# Patient Record
Sex: Female | Born: 1955 | Race: White | Hispanic: No | Smoking: Current every day smoker
Health system: Southern US, Community
[De-identification: ages and names within clinical notes are randomized; demographics above are authoritative.]

## PROBLEM LIST (undated history)

## (undated) ENCOUNTER — Emergency Department (HOSPITAL_BASED_OUTPATIENT_CLINIC_OR_DEPARTMENT_OTHER): Admission: EM | Payer: BLUE CROSS/BLUE SHIELD | Source: Home / Self Care

## (undated) DIAGNOSIS — F329 Major depressive disorder, single episode, unspecified: Secondary | ICD-10-CM

## (undated) DIAGNOSIS — F419 Anxiety disorder, unspecified: Secondary | ICD-10-CM

## (undated) DIAGNOSIS — K219 Gastro-esophageal reflux disease without esophagitis: Secondary | ICD-10-CM

## (undated) DIAGNOSIS — F32A Depression, unspecified: Secondary | ICD-10-CM

## (undated) DIAGNOSIS — K449 Diaphragmatic hernia without obstruction or gangrene: Secondary | ICD-10-CM

## (undated) DIAGNOSIS — T7840XA Allergy, unspecified, initial encounter: Secondary | ICD-10-CM

## (undated) DIAGNOSIS — I10 Essential (primary) hypertension: Secondary | ICD-10-CM

## (undated) HISTORY — PX: WISDOM TOOTH EXTRACTION: SHX21

## (undated) HISTORY — DX: Allergy, unspecified, initial encounter: T78.40XA

## (undated) HISTORY — PX: APPENDECTOMY: SHX54

## (undated) HISTORY — PX: TUBAL LIGATION: SHX77

---

## 1999-04-19 ENCOUNTER — Other Ambulatory Visit: Admission: RE | Admit: 1999-04-19 | Discharge: 1999-04-19 | Payer: Self-pay | Admitting: *Deleted

## 1999-04-27 ENCOUNTER — Encounter (INDEPENDENT_AMBULATORY_CARE_PROVIDER_SITE_OTHER): Payer: Self-pay | Admitting: Specialist

## 1999-04-27 ENCOUNTER — Other Ambulatory Visit: Admission: RE | Admit: 1999-04-27 | Discharge: 1999-04-27 | Payer: Self-pay | Admitting: *Deleted

## 2000-04-24 ENCOUNTER — Encounter: Payer: Self-pay | Admitting: Internal Medicine

## 2000-04-24 ENCOUNTER — Emergency Department (HOSPITAL_COMMUNITY): Admission: EM | Admit: 2000-04-24 | Discharge: 2000-04-24 | Payer: Self-pay | Admitting: Internal Medicine

## 2000-09-06 ENCOUNTER — Other Ambulatory Visit: Admission: RE | Admit: 2000-09-06 | Discharge: 2000-09-06 | Payer: Self-pay | Admitting: *Deleted

## 2001-11-29 ENCOUNTER — Emergency Department (HOSPITAL_COMMUNITY): Admission: EM | Admit: 2001-11-29 | Discharge: 2001-11-29 | Payer: Self-pay | Admitting: Emergency Medicine

## 2001-12-02 ENCOUNTER — Other Ambulatory Visit (HOSPITAL_COMMUNITY): Admission: RE | Admit: 2001-12-02 | Discharge: 2001-12-11 | Payer: Self-pay | Admitting: Psychiatry

## 2001-12-12 ENCOUNTER — Encounter: Admission: RE | Admit: 2001-12-12 | Discharge: 2001-12-12 | Payer: Self-pay | Admitting: *Deleted

## 2003-07-06 ENCOUNTER — Emergency Department (HOSPITAL_COMMUNITY): Admission: EM | Admit: 2003-07-06 | Discharge: 2003-07-06 | Payer: Self-pay | Admitting: Emergency Medicine

## 2003-07-07 ENCOUNTER — Other Ambulatory Visit (HOSPITAL_COMMUNITY): Admission: RE | Admit: 2003-07-07 | Discharge: 2003-07-22 | Payer: Self-pay | Admitting: *Deleted

## 2003-09-01 ENCOUNTER — Emergency Department (HOSPITAL_COMMUNITY): Admission: EM | Admit: 2003-09-01 | Discharge: 2003-09-01 | Payer: Self-pay | Admitting: *Deleted

## 2005-04-30 ENCOUNTER — Ambulatory Visit: Payer: Self-pay | Admitting: Family Medicine

## 2005-04-30 ENCOUNTER — Ambulatory Visit: Payer: Self-pay | Admitting: *Deleted

## 2005-09-14 ENCOUNTER — Ambulatory Visit: Payer: Self-pay | Admitting: Family Medicine

## 2006-03-19 ENCOUNTER — Ambulatory Visit: Payer: Self-pay | Admitting: Family Medicine

## 2006-05-22 ENCOUNTER — Ambulatory Visit: Payer: Self-pay | Admitting: Family Medicine

## 2006-05-24 ENCOUNTER — Ambulatory Visit: Payer: Self-pay | Admitting: Family Medicine

## 2007-02-25 ENCOUNTER — Ambulatory Visit: Payer: Self-pay | Admitting: Family Medicine

## 2007-04-29 ENCOUNTER — Encounter (INDEPENDENT_AMBULATORY_CARE_PROVIDER_SITE_OTHER): Payer: Self-pay | Admitting: Family Medicine

## 2007-04-29 ENCOUNTER — Ambulatory Visit: Payer: Self-pay | Admitting: Family Medicine

## 2007-07-16 ENCOUNTER — Encounter (INDEPENDENT_AMBULATORY_CARE_PROVIDER_SITE_OTHER): Payer: Self-pay | Admitting: *Deleted

## 2007-08-01 ENCOUNTER — Telehealth (INDEPENDENT_AMBULATORY_CARE_PROVIDER_SITE_OTHER): Payer: Self-pay | Admitting: *Deleted

## 2007-11-05 ENCOUNTER — Emergency Department (HOSPITAL_COMMUNITY): Admission: EM | Admit: 2007-11-05 | Discharge: 2007-11-05 | Payer: Self-pay | Admitting: Emergency Medicine

## 2008-02-11 ENCOUNTER — Emergency Department (HOSPITAL_COMMUNITY): Admission: EM | Admit: 2008-02-11 | Discharge: 2008-02-11 | Payer: Self-pay | Admitting: Family Medicine

## 2008-04-23 ENCOUNTER — Emergency Department (HOSPITAL_COMMUNITY): Admission: EM | Admit: 2008-04-23 | Discharge: 2008-04-23 | Payer: Self-pay | Admitting: Emergency Medicine

## 2008-07-17 ENCOUNTER — Emergency Department (HOSPITAL_COMMUNITY): Admission: EM | Admit: 2008-07-17 | Discharge: 2008-07-17 | Payer: Self-pay | Admitting: Family Medicine

## 2009-03-04 ENCOUNTER — Emergency Department (HOSPITAL_COMMUNITY): Admission: EM | Admit: 2009-03-04 | Discharge: 2009-03-04 | Payer: Self-pay | Admitting: Family Medicine

## 2009-07-20 ENCOUNTER — Emergency Department (HOSPITAL_COMMUNITY): Admission: EM | Admit: 2009-07-20 | Discharge: 2009-07-20 | Payer: Self-pay | Admitting: Family Medicine

## 2009-09-18 ENCOUNTER — Emergency Department (HOSPITAL_COMMUNITY): Admission: EM | Admit: 2009-09-18 | Discharge: 2009-09-18 | Payer: Self-pay | Admitting: Family Medicine

## 2009-10-13 ENCOUNTER — Emergency Department (HOSPITAL_COMMUNITY): Admission: EM | Admit: 2009-10-13 | Discharge: 2009-10-13 | Payer: Self-pay | Admitting: Emergency Medicine

## 2009-12-17 ENCOUNTER — Emergency Department (HOSPITAL_BASED_OUTPATIENT_CLINIC_OR_DEPARTMENT_OTHER): Admission: EM | Admit: 2009-12-17 | Discharge: 2009-12-17 | Payer: Self-pay | Admitting: Emergency Medicine

## 2010-11-19 ENCOUNTER — Encounter: Payer: Self-pay | Admitting: Occupational Therapy

## 2010-11-29 ENCOUNTER — Encounter: Payer: Self-pay | Admitting: Obstetrics & Gynecology

## 2011-02-02 LAB — COMPREHENSIVE METABOLIC PANEL
Albumin: 4 g/dL (ref 3.5–5.2)
Alkaline Phosphatase: 92 U/L (ref 39–117)
BUN: 8 mg/dL (ref 6–23)
Calcium: 9.8 mg/dL (ref 8.4–10.5)
Glucose, Bld: 103 mg/dL — ABNORMAL HIGH (ref 70–99)
Potassium: 3.7 mEq/L (ref 3.5–5.1)
Total Protein: 7.6 g/dL (ref 6.0–8.3)

## 2011-02-02 LAB — CBC
HCT: 45.7 % (ref 36.0–46.0)
MCHC: 34.7 g/dL (ref 30.0–36.0)
Platelets: 245 10*3/uL (ref 150–400)
RDW: 13.3 % (ref 11.5–15.5)

## 2011-02-02 LAB — DIFFERENTIAL
Basophils Relative: 0 % (ref 0–1)
Lymphocytes Relative: 24 % (ref 12–46)
Lymphs Abs: 1.9 10*3/uL (ref 0.7–4.0)
Monocytes Absolute: 0.6 10*3/uL (ref 0.1–1.0)
Monocytes Relative: 8 % (ref 3–12)
Neutro Abs: 5.3 10*3/uL (ref 1.7–7.7)
Neutrophils Relative %: 68 % (ref 43–77)

## 2011-02-07 ENCOUNTER — Emergency Department (HOSPITAL_COMMUNITY)
Admission: EM | Admit: 2011-02-07 | Discharge: 2011-02-08 | Disposition: A | Discharge status: Home / Self Care | Payer: BC Managed Care – PPO | Attending: Emergency Medicine | Admitting: Emergency Medicine

## 2011-02-07 DIAGNOSIS — M79609 Pain in unspecified limb: Secondary | ICD-10-CM | POA: Insufficient documentation

## 2011-02-07 DIAGNOSIS — M2550 Pain in unspecified joint: Secondary | ICD-10-CM | POA: Insufficient documentation

## 2011-02-08 LAB — POCT I-STAT, CHEM 8
BUN: 4 mg/dL — ABNORMAL LOW (ref 6–23)
Chloride: 101 mEq/L (ref 96–112)
Creatinine, Ser: 0.8 mg/dL (ref 0.4–1.2)
Glucose, Bld: 92 mg/dL (ref 70–99)
Hemoglobin: 14.6 g/dL (ref 12.0–15.0)
Potassium: 3.4 mEq/L — ABNORMAL LOW (ref 3.5–5.1)
Sodium: 140 mEq/L (ref 135–145)

## 2011-07-26 LAB — POCT I-STAT, CHEM 8
Calcium, Ion: 1.17
Chloride: 104
Creatinine, Ser: 0.9
Glucose, Bld: 92
Hemoglobin: 16 — ABNORMAL HIGH
Potassium: 4.1

## 2011-07-26 LAB — DIFFERENTIAL
Basophils Relative: 1
Eosinophils Absolute: 0.1
Lymphs Abs: 2.3
Monocytes Absolute: 0.6
Monocytes Relative: 7
Neutrophils Relative %: 62

## 2011-07-26 LAB — CBC
Hemoglobin: 14.9
MCHC: 35
MCV: 91.6
RBC: 4.65
WBC: 8

## 2011-07-30 LAB — POCT URINALYSIS DIP (DEVICE)
Bilirubin Urine: NEGATIVE
Glucose, UA: 100 — AB
Specific Gravity, Urine: 1.015
pH: 6

## 2012-01-24 ENCOUNTER — Emergency Department (HOSPITAL_COMMUNITY)
Admission: EM | Admit: 2012-01-24 | Discharge: 2012-01-24 | Disposition: A | Discharge status: Home / Self Care | Payer: Self-pay | Attending: Emergency Medicine | Admitting: Emergency Medicine

## 2012-01-24 ENCOUNTER — Encounter (HOSPITAL_COMMUNITY): Payer: Self-pay

## 2012-01-24 DIAGNOSIS — K449 Diaphragmatic hernia without obstruction or gangrene: Secondary | ICD-10-CM | POA: Insufficient documentation

## 2012-01-24 DIAGNOSIS — K219 Gastro-esophageal reflux disease without esophagitis: Secondary | ICD-10-CM | POA: Insufficient documentation

## 2012-01-24 DIAGNOSIS — S161XXA Strain of muscle, fascia and tendon at neck level, initial encounter: Secondary | ICD-10-CM

## 2012-01-24 DIAGNOSIS — M542 Cervicalgia: Secondary | ICD-10-CM | POA: Insufficient documentation

## 2012-01-24 HISTORY — DX: Diaphragmatic hernia without obstruction or gangrene: K44.9

## 2012-01-24 MED ORDER — DIAZEPAM 5 MG PO TABS: 5.0000 mg | ORAL_TABLET | Freq: Two times a day (BID) | ORAL | Status: AC

## 2012-01-24 MED ORDER — DIAZEPAM 5 MG PO TABS
5.0000 mg | ORAL_TABLET | Freq: Once | ORAL | Status: AC
  Administered 2012-01-24: 5 mg via ORAL
  Filled 2012-01-24: qty 1

## 2012-01-24 NOTE — ED Notes (Signed)
Pt c/o pain to L shoulder radiating into L neck and back of head, onset this am, worse with movement. Minimal improvement with heat. Minimal relief with Ibuprofen. Pt states she had this 10ish years ago but can not recall treatment.

## 2012-01-24 NOTE — ED Notes (Signed)
Pt has acid reflux and has to sleep sitting up, she complains of neck pain with spasms

## 2012-01-24 NOTE — ED Provider Notes (Signed)
History     CSN: [id number]  Arrival date & time 01/24/12  2033   First MD Initiated Contact with Patient 01/24/12 2210      Chief Complaint  Patient presents with  . Neck Pain    (Consider location/radiation/quality/duration/timing/severity/associated sxs/prior treatment) HPI Comments: Patient notes she has neck pain that started this morning.  She slept upright due to acid reflux related to her hiatal hernia and noted that her head was either slumped over forward or to the spinal night.  She's noted worse pain on the left side radiating up to the back of her head and down her left shoulder.  She has no weakness or numbness in her arm.  She is able to complete all tasks with her upper extremities.  No fevers or chills.  No prior neck injuries.  No neck surgeries.  Patient believes this is a muscle spasm and has been using warm compresses with mild relief.  She has noted similar episodes in the past although it has been many years.  Patient is a 56 y.o. female presenting with neck pain. The history is provided by the patient. No language interpreter was used.  Neck Pain  This is a new problem. Pertinent negatives include no chest pain and no headaches.    Past Medical History  Diagnosis Date  . Hiatal hernia     History reviewed. No pertinent past surgical history.  History reviewed. No pertinent family history.  History  Substance Use Topics  . Smoking status: Not on file  . Smokeless tobacco: Not on file  . Alcohol Use:     OB History    Grav Para Term Preterm Abortions TAB SAB Ect Mult Living                  Review of Systems  Constitutional: Negative.  Negative for fever and chills.  HENT: Positive for neck pain.   Eyes: Negative.  Negative for discharge and redness.  Respiratory: Negative.  Negative for cough and shortness of breath.   Cardiovascular: Negative.  Negative for chest pain.  Gastrointestinal: Negative.  Negative for nausea, vomiting, abdominal pain  and diarrhea.  Genitourinary: Negative.  Negative for dysuria and vaginal discharge.  Musculoskeletal: Negative for back pain.  Skin: Negative.  Negative for color change and rash.  Neurological: Negative.  Negative for syncope and headaches.  Hematological: Negative.  Negative for adenopathy.  Psychiatric/Behavioral: Negative.  Negative for confusion.  All other systems reviewed and are negative.    Allergies  Review of patient's allergies indicates no known allergies.  Home Medications   Current Outpatient Rx  Name Route Sig Dispense Refill  . ACETAMINOPHEN 500 MG PO TABS Oral Take 1,500 mg by mouth 3 (three) times daily as needed. For pain.    . IBUPROFEN 200 MG PO TABS Oral Take 800 mg by mouth every 6 (six) hours as needed. For pain.    Marland Kitchen OMEPRAZOLE 20 MG PO CPDR Oral Take 20 mg by mouth daily.      BP 191/85  Pulse 83  Temp(Src) 97.9 F (36.6 C) (Oral)  Resp 18  SpO2 99%  Physical Exam  Nursing note and vitals reviewed. Constitutional: She is oriented to person, place, and time. She appears well-developed and well-nourished.  Non-toxic appearance. She does not have a sickly appearance.  HENT:  Head: Normocephalic and atraumatic.  Eyes: Conjunctivae, EOM and lids are normal. Pupils are equal, round, and reactive to light. No scleral icterus.  Neck: Trachea normal  and normal range of motion. Neck supple.  Cardiovascular: Normal rate, regular rhythm and normal heart sounds.   Pulmonary/Chest: Effort normal and breath sounds normal. No respiratory distress. She has no wheezes. She has no rales.  Abdominal: Soft. Normal appearance. There is no tenderness. There is no rebound, no guarding and no CVA tenderness.  Musculoskeletal: Normal range of motion.       No focal C-spine tenderness or step-offs on examination.  Patient does have tenderness to palpation over her left paraspinal muscles and left upper trapezius muscles  Lymphadenopathy:    She has no cervical adenopathy.    Neurological: She is alert and oriented to person, place, and time. She has normal strength.  Skin: Skin is warm, dry and intact. No rash noted.  Psychiatric: She has a normal mood and affect. Her behavior is normal. Judgment and thought content normal.    ED Course  Procedures (including critical care time)  Labs Reviewed - No data to display No results found.   No diagnosis found.    MDM  Patient with likely cervical muscle spasm exacerbated by sleeping upright with her head that awkward angles last night.  Patient has no neurovascular deficits.  She has no other trauma or injuries or past neck surgeries.  Patient will be placed on muscle relaxants and has been advised that if it does not improve she should seek reevaluation.        Nat Christen, MD 01/24/12 2234

## 2012-01-24 NOTE — Discharge Instructions (Signed)
Cervical Strain Care After A cervical strain is when the muscles and ligaments in your neck have been stretched. The bones are not broken. If you had any problems moving your arms or legs immediately after the injury, even if the problem has gone away, make sure to tell this to your caregiver.  HOME CARE INSTRUCTIONS   While awake, apply ice packs to the neck or areas of pain about every 1 to 2 hours, for 15 to 20 minutes at a time. Do this for 2 days. If you were given a cervical collar for support, ask your caregiver if you may remove it for bathing or applying ice.   If given a cervical collar, wear as instructed. Do not remove any collar unless instructed by a caregiver.   Only take over-the-counter or prescription medicines for pain, discomfort, or fever as directed by your caregiver.  Recheck with the hospital or clinic after a radiologist has read your X-rays. Recheck with the hospital or clinic to make sure the initial readings are correct. Do this also to determine if you need further studies. It is your responsibility to find out your X-ray results. X-rays are sometimes repeated in one week to ten days. These are often repeated to make sure that a hairline fracture was not overlooked. Ask your caregiver how you are to find out about your radiology (X-ray) results. SEEK IMMEDIATE MEDICAL CARE IF:   You have increasing pain in your neck.   You develop difficulties swallowing or breathing.   You have numbness, weakness, or movement problems in the arms or legs.   You have difficulty walking.   You develop bowel or bladder retention or incontinence.   You have problems with walking.  MAKE SURE YOU:   Understand these instructions.   Will watch your condition.   Will get help right away if you are not doing well or get worse.  Document Released: 10/15/2005 Document Revised: 06/27/2011 Document Reviewed: 05/28/2008 ExitCare Patient Information 2012 [employer]. 

## 2012-04-10 ENCOUNTER — Encounter (HOSPITAL_COMMUNITY): Payer: Self-pay | Admitting: Emergency Medicine

## 2012-04-10 ENCOUNTER — Emergency Department (HOSPITAL_COMMUNITY)
Admission: EM | Admit: 2012-04-10 | Discharge: 2012-04-10 | Disposition: A | Discharge status: Home / Self Care | Payer: Self-pay | Attending: Emergency Medicine | Admitting: Emergency Medicine

## 2012-04-10 DIAGNOSIS — J019 Acute sinusitis, unspecified: Secondary | ICD-10-CM

## 2012-04-10 DIAGNOSIS — R51 Headache: Secondary | ICD-10-CM | POA: Insufficient documentation

## 2012-04-10 DIAGNOSIS — R197 Diarrhea, unspecified: Secondary | ICD-10-CM

## 2012-04-10 DIAGNOSIS — F17200 Nicotine dependence, unspecified, uncomplicated: Secondary | ICD-10-CM | POA: Insufficient documentation

## 2012-04-10 MED ORDER — AZITHROMYCIN 250 MG PO TABS: 250.0000 mg | ORAL_TABLET | Freq: Every day | ORAL | Status: AC

## 2012-04-10 NOTE — Discharge Instructions (Signed)
RESOURCE GUIDE  Chronic Pain Problems: Contact [facility]  [phone] Patients need to be referred by their primary care doctor.  Insufficient Money for Medicine: Contact United Way:  call "211" or [facility] [phone].  No Primary Care Doctor: - Call [facility]  [phone] - can help you locate a primary care doctor that  accepts your insurance, provides certain services, etc. - Physician Referral Service- 1-800-533-3463  Agencies that provide inexpensive medical care: - [facility]  [phone] - [facility]  [phone] - [facility]  [phone] - [facility]  [phone] - [facility] Parenthood  [phone] - [facility]  [phone]  Medicaid-accepting [place] Providers: - [facility]- [address]  [phone], Mon-Fri 9am-7pm, Sat 9am-1pm - [facility]- [address], [address]  [phone] - [facility]- [address], [address]  [phone] - [facility]- [address] [place] [address]  [phone] - Veita B[facility]- [address], [phone]  Only accepts [place] Access Medicaid patients after they have their name  applied to their card  Self Pay (no insurance) in [place]: - Sickle Cell Patients: Dr Eric Dean, [facility]  [address], [phone] - [facility]- [address]  [phone]       -     [facility]- [address]       -     [facility]- see information above (Speak to Pam H if you do not have insurance)       -  [facility]- [address], [phone]       -  [facility] [place]- [address],  [phone]       -  [facility]- [address] [place] [address], [phone]       -  Dr Osei-Bonsu-  [address], [place], [phone]       -  [facility]- [address], [phone]       -  [facility]- [address] [place] [address], [phone], also [address], [phone]       -    [facility]- [address], [phone], 1st & 3rd Saturday   every month, 10am-1pm  1) Find a Doctor and Pay Out of Pocket Although you won't have to find out who is covered by your insurance plan, it is a good idea to ask around and get recommendations. You will then need to call the office and see if the doctor you have chosen will accept you as a new patient and what types of options they offer for patients who are self-pay. Some doctors offer discounts or will set up payment plans for their patients who do not have insurance, but you will need to ask so you aren't surprised when you get to your appointment.  2) Contact Your Local Health Department Not all health departments have doctors that can see patients for sick visits, but many do, so it is worth a call to see if yours does. If you don't know where your local health department is, you can check in your phone book. The CDC also has a tool to help you locate your state's health department, and many state websites also have   listings of all of their local health departments.  3) Find a Walk-in Clinic If your illness is not likely to be very severe or complicated, you may want to try a walk in clinic. These are popping up all over the country in pharmacies, drugstores, and shopping centers. They're usually staffed by nurse practitioners or physician assistants that have been trained to treat common illnesses and complaints. They're usually fairly quick and inexpensive. However, if you have serious medical issues or chronic medical problems, these are probably not your best option  STD Testing - [place] [facility] [place], [facility] Clinic, [address], [place], phone [phone] or 1-877-539-9860.  Monday - Friday, call for an appointment. - [place]  [facility] [place], [facility] Clinic, [address], [place], phone [phone] or 1-877-539-9860.  Monday - Friday, call for an appointment.  Abuse/Neglect: - [place] Child Abuse Hotline (336) 641-3795 - [place] Child Abuse Hotline 800-378-5315 (After Hours)  Emergency Shelter:  [facility] (336) 271-5985  Maternity Homes: - [facility] (336) 275-9566 - [facility] (704) 372-4663  MRSA Hotline #:   832-7006  [place] Resources  [facility] [place]  [employer] [place] [facility] [address]                 [address]         [address]  [place]                                               [place]                              [place] Phone:  [phone]                                  Phone:  [phone]                   Phone:  [phone]  [place] [facility], [phone] - [place] Services - [facility]- 1-888-581-9988       -     [facility] in [place], [address],                                  336-349-4454, Insurance  [place] Child Abuse Hotline (336) 342-1394 or (336) 342-3537 (After Hours)   Behavioral Health Services  Substance Abuse Resources: - [facility]  336-882-2125 - [facility] 336-784-9470 - [facility] 336-285-9073 - [facility] 336-845-3988 - Residential & Outpatient Substance Abuse Program  800-659-3381  Psychological Services: - [facility]  [phone] - [facility]  [phone] - [place] [facility], [address], [place], ACCESS LINE: 1-800-853-5163 or 336-641-4981, Http://www.[facility].com/services/adult.htm  Dental Assistance  If unable to pay or uninsured, contact:  [facility] or [place] [facility] to become qualified for the adult dental  clinic.  Patients with Medicaid: [facility] [address], [phone], [phone]  If unable   to pay, or uninsured, contact [facility] ([phone]) or [place] [facility] ([phone] in [place], [phone] in [place]) to become qualified for the adult dental clinic  Other Low-Cost Community Dental Services: - [facility]- [address], [place], [place], [address], [phone], [address]. [address], 2nd and 4th Thursday of the month at 6:30am.  10 clients each day by appointment, can sometimes see walk-in patients if someone does not show for an appointment. - [facility]- [address], [place], [place], [address], [phone] - [facility]- [address], [place], [place], [address], [phone] - [place] [facility]- [phone] - [facility]- [phone] -  [facility]- [phone]    

## 2012-04-10 NOTE — ED Provider Notes (Signed)
History     CSN: [id number]  Arrival date & time 04/10/12  4098   First MD Initiated Contact with Patient 04/10/12 2022      Chief Complaint  Patient presents with  . Headache  . Diarrhea    (Consider location/radiation/quality/duration/timing/severity/associated sxs/prior treatment) HPI Comments: Patient reports that she has had sinus pressure for the past 4 days.  Pain located over the frontal and maxillary sinuses.  Sinus pressure associated with nasal congestion, post nasal drip, and rhinorrhea.  She has been taking Advil for the pain, which helps somewhat.  She has also been having diarrhea for the past 4 days.  No vomiting.  No abdominal pain.  No blood in her stool.  She has taken Imodium for the diarrhea, which has helped.  Patient is a 56 y.o. female presenting with headaches and diarrhea. The history is provided by the patient.  Headache  Pertinent negatives include no fever, no shortness of breath, no nausea and no vomiting.  Diarrhea The primary symptoms include diarrhea. Primary symptoms do not include fever, abdominal pain, nausea or vomiting.  The illness does not include chills.    Past Medical History  Diagnosis Date  . Hiatal hernia     History reviewed. No pertinent past surgical history.  History reviewed. No pertinent family history.  History  Substance Use Topics  . Smoking status: Current Everyday Smoker -- 1.0 packs/day  . Smokeless tobacco: Not on file  . Alcohol Use: No    OB History    Grav Para Term Preterm Abortions TAB SAB Ect Mult Living                  Review of Systems  Constitutional: Negative for fever and chills.  HENT: Positive for congestion, rhinorrhea, postnasal drip and sinus pressure. Negative for ear pain, sore throat, trouble swallowing, neck pain and neck stiffness.   Eyes: Negative for visual disturbance.  Respiratory: Negative for cough and shortness of breath.   Gastrointestinal: Positive for diarrhea. Negative for  nausea, vomiting and abdominal pain.  Neurological: Positive for headaches. Negative for dizziness, syncope and light-headedness.    Allergies  Review of patient's allergies indicates no known allergies.  Home Medications   Current Outpatient Rx  Name Route Sig Dispense Refill  . IBUPROFEN 200 MG PO TABS Oral Take 800 mg by mouth every 6 (six) hours as needed. For pain.    Marland Kitchen OMEPRAZOLE 20 MG PO CPDR Oral Take 20 mg by mouth daily.      BP 167/87  Pulse 91  Temp 98.7 F (37.1 C) (Oral)  Resp 18  SpO2 100%  Physical Exam  Nursing note and vitals reviewed. Constitutional: She appears well-developed and well-nourished. No distress.  HENT:  Head: Normocephalic and atraumatic.  Right Ear: Hearing, tympanic membrane, external ear and ear canal normal.  Left Ear: Hearing, tympanic membrane, external ear and ear canal normal.  Nose: Mucosal edema and rhinorrhea present. Right sinus exhibits maxillary sinus tenderness and frontal sinus tenderness. Left sinus exhibits no maxillary sinus tenderness and no frontal sinus tenderness.  Neck: Normal range of motion. Neck supple.  Cardiovascular: Normal rate, regular rhythm and normal heart sounds.   Pulmonary/Chest: Effort normal and breath sounds normal. No respiratory distress. She has no wheezes. She has no rales. She exhibits no tenderness.  Musculoskeletal: Normal range of motion.  Lymphadenopathy:    She has no cervical adenopathy.  Neurological: She is alert.  Skin: Skin is warm and dry. She is not  diaphoretic. No erythema.  Psychiatric: She has a normal mood and affect.    ED Course  Procedures (including critical care time)  Labs Reviewed - No data to display No results found.   No diagnosis found.    MDM  Signs and symptoms consistent with Acute Sinusitis.  Patient given Rx for Azithromycin.         Pascal Lux Grubbs, PA-C 04/11/12 1947

## 2012-04-10 NOTE — ED Notes (Signed)
Patient reports that she has a sinus pressure to her right side of her face. The patient reports sleeping and tired for the last 3 days

## 2012-04-21 NOTE — ED Provider Notes (Signed)
Medical screening examination/treatment/procedure(s) were performed by non-physician practitioner and as supervising physician I was immediately available for consultation/collaboration.  Fredrick Dray, MD 04/21/12 0724 

## 2012-08-19 ENCOUNTER — Emergency Department (HOSPITAL_COMMUNITY)
Admission: EM | Admit: 2012-08-19 | Discharge: 2012-08-19 | Disposition: A | Discharge status: Home / Self Care | Payer: Self-pay | Attending: Emergency Medicine | Admitting: Emergency Medicine

## 2012-08-19 ENCOUNTER — Encounter (HOSPITAL_COMMUNITY): Payer: Self-pay | Admitting: *Deleted

## 2012-08-19 DIAGNOSIS — Z8719 Personal history of other diseases of the digestive system: Secondary | ICD-10-CM | POA: Insufficient documentation

## 2012-08-19 DIAGNOSIS — R1110 Vomiting, unspecified: Secondary | ICD-10-CM | POA: Insufficient documentation

## 2012-08-19 DIAGNOSIS — R52 Pain, unspecified: Secondary | ICD-10-CM | POA: Insufficient documentation

## 2012-08-19 DIAGNOSIS — F17200 Nicotine dependence, unspecified, uncomplicated: Secondary | ICD-10-CM | POA: Insufficient documentation

## 2012-08-19 DIAGNOSIS — Z8659 Personal history of other mental and behavioral disorders: Secondary | ICD-10-CM | POA: Insufficient documentation

## 2012-08-19 DIAGNOSIS — K5289 Other specified noninfective gastroenteritis and colitis: Secondary | ICD-10-CM | POA: Insufficient documentation

## 2012-08-19 DIAGNOSIS — R1084 Generalized abdominal pain: Secondary | ICD-10-CM | POA: Insufficient documentation

## 2012-08-19 DIAGNOSIS — K529 Noninfective gastroenteritis and colitis, unspecified: Secondary | ICD-10-CM

## 2012-08-19 HISTORY — DX: Depression, unspecified: F32.A

## 2012-08-19 HISTORY — DX: Anxiety disorder, unspecified: F41.9

## 2012-08-19 HISTORY — DX: Major depressive disorder, single episode, unspecified: F32.9

## 2012-08-19 LAB — CBC WITH DIFFERENTIAL/PLATELET
Basophils Absolute: 0 10*3/uL (ref 0.0–0.1)
Basophils Relative: 0 % (ref 0–1)
Eosinophils Absolute: 0.1 10*3/uL (ref 0.0–0.7)
Eosinophils Relative: 1 % (ref 0–5)
HCT: 41.9 % (ref 36.0–46.0)
Hemoglobin: 14.6 g/dL (ref 12.0–15.0)
Lymphocytes Relative: 27 % (ref 12–46)
Lymphs Abs: 2.3 10*3/uL (ref 0.7–4.0)
MCH: 32.9 pg (ref 26.0–34.0)
MCHC: 34.8 g/dL (ref 30.0–36.0)
MCV: 94.4 fL (ref 78.0–100.0)
Monocytes Absolute: 0.6 10*3/uL (ref 0.1–1.0)
Monocytes Relative: 7 % (ref 3–12)
Neutro Abs: 5.5 10*3/uL (ref 1.7–7.7)
Neutrophils Relative %: 65 % (ref 43–77)
Platelets: 206 10*3/uL (ref 150–400)
RBC: 4.44 MIL/uL (ref 3.87–5.11)
RDW: 12.6 % (ref 11.5–15.5)
WBC: 8.5 10*3/uL (ref 4.0–10.5)

## 2012-08-19 LAB — URINALYSIS, MICROSCOPIC ONLY
Bilirubin Urine: NEGATIVE
Glucose, UA: NEGATIVE mg/dL
Hgb urine dipstick: NEGATIVE
Ketones, ur: NEGATIVE mg/dL
Leukocytes, UA: NEGATIVE
Nitrite: NEGATIVE
Protein, ur: NEGATIVE mg/dL
Specific Gravity, Urine: 1.007 (ref 1.005–1.030)
Urobilinogen, UA: 0.2 mg/dL (ref 0.0–1.0)
pH: 6 (ref 5.0–8.0)

## 2012-08-19 LAB — COMPREHENSIVE METABOLIC PANEL
Alkaline Phosphatase: 88 U/L (ref 39–117)
BUN: 9 mg/dL (ref 6–23)
Calcium: 9.5 mg/dL (ref 8.4–10.5)
Creatinine, Ser: 0.69 mg/dL (ref 0.50–1.10)
GFR calc Af Amer: >90 mL/min (ref >90)
Glucose, Bld: 98 mg/dL (ref 70–99)
Total Protein: 7 g/dL (ref 6.0–8.3)

## 2012-08-19 MED ORDER — ONDANSETRON HCL 4 MG PO TABS: 4.0000 mg | ORAL_TABLET | Freq: Four times a day (QID) | ORAL | Status: DC

## 2012-08-19 MED ORDER — SODIUM CHLORIDE 0.9 % IV SOLN
1000.0000 mL | Freq: Once | INTRAVENOUS | Status: AC
  Administered 2012-08-19: 1000 mL via INTRAVENOUS

## 2012-08-19 MED ORDER — ONDANSETRON HCL 4 MG/2ML IJ SOLN
4.0000 mg | Freq: Once | INTRAMUSCULAR | Status: AC
  Administered 2012-08-19: 4 mg via INTRAVENOUS
  Filled 2012-08-19: qty 2

## 2012-08-19 MED ORDER — SODIUM CHLORIDE 0.9 % IV SOLN
1000.0000 mL | INTRAVENOUS | Status: DC
  Administered 2012-08-19: 1000 mL via INTRAVENOUS

## 2012-08-19 MED ORDER — POTASSIUM CHLORIDE CRYS ER 20 MEQ PO TBCR
40.0000 meq | EXTENDED_RELEASE_TABLET | Freq: Once | ORAL | Status: AC
  Administered 2012-08-19: 40 meq via ORAL
  Filled 2012-08-19: qty 2

## 2012-08-19 NOTE — ED Provider Notes (Signed)
History     CSN: [id number]  Arrival date & time 08/19/12  4098   First MD Initiated Contact with Patient 08/19/12 1012      Chief Complaint  Patient presents with  . Diarrhea  . Generalized Body Aches  . Emesis    (Consider location/radiation/quality/duration/timing/severity/associated sxs/prior treatment) Patient is a 56 y.o. female presenting with diarrhea and vomiting. The history is provided by the patient. No language interpreter was used.  Diarrhea The primary symptoms include abdominal pain, nausea, vomiting and diarrhea. Primary symptoms do not include fever, dysuria or rash. The illness began 3 to 5 days ago. The onset was gradual. The problem has not changed since onset. The illness is also significant for chills and bloating. The illness does not include constipation, back pain or itching. Significant associated medical issues include GERD. Associated medical issues do not include alcohol abuse, bowel resection, irritable bowel syndrome or diverticulitis.  Emesis  Associated symptoms include abdominal pain, chills and diarrhea. Pertinent negatives include no fever.   56yo female with c/o n/v/d x 4 days.  States that she started vomiting on Saturday and Sunday.  States that she has had diarrhea x 2 days.  > 10 times in the last 24 hours.  She felt general body aches, chills for 6 days prior to that. Also having her regular eipgastric pain with a pmh of gerd.  States that she went to Dr. Quintella Reichert until [facility] closed.  She has taken immodium > 5 times with some relief.  pmh Hiatal hernia, depression, anxiety, smoker.   Generalized abdominal tenderness  No periods x 10 years.  No vaginal discharge.  States 2 weeks ago she had RUQ pain.   Past Medical History  Diagnosis Date  . Hiatal hernia   . Anxiety   . Depression     History reviewed. No pertinent past surgical history.  No family history on file.  History  Substance Use Topics  . Smoking status: Current Every  Day Smoker -- 1.0 packs/day  . Smokeless tobacco: Not on file  . Alcohol Use: No    OB History    Grav Para Term Preterm Abortions TAB SAB Ect Mult Living                  Review of Systems  Constitutional: Positive for chills. Negative for fever.  HENT: Negative.   Eyes: Negative.   Respiratory: Negative.  Negative for shortness of breath.   Cardiovascular: Negative.   Gastrointestinal: Positive for nausea, vomiting, abdominal pain, diarrhea and bloating. Negative for constipation, blood in stool and abdominal distention.       Epigastric pain  Genitourinary: Negative for dysuria, urgency, frequency, decreased urine volume, vaginal bleeding, vaginal discharge, difficulty urinating, vaginal pain and menstrual problem.  Musculoskeletal: Negative for back pain.  Skin: Negative for itching and rash.  Neurological: Negative.  Negative for light-headedness.  Psychiatric/Behavioral: Negative.  The patient is not nervous/anxious.   All other systems reviewed and are negative.    Allergies  Review of patient's allergies indicates no known allergies.  Home Medications   Current Outpatient Rx  Name Route Sig Dispense Refill  . ACETAMINOPHEN 500 MG PO TABS Oral Take 1,000 mg by mouth every 6 (six) hours as needed. Pain    . IBUPROFEN 200 MG PO TABS Oral Take 600 mg by mouth every 6 (six) hours as needed. For pain.    Marland Kitchen OMEPRAZOLE 20 MG PO CPDR Oral Take 20 mg by mouth daily.  BP 140/72  Pulse 77  Temp 99.1 F (37.3 C) (Oral)  Resp 16  Wt 128 lb 2 oz (58.117 kg)  SpO2 100%  Physical Exam  Nursing note and vitals reviewed. Constitutional: She is oriented to person, place, and time. She appears well-developed and well-nourished.  HENT:  Head: Normocephalic and atraumatic.  Eyes: Conjunctivae normal and EOM are normal. Pupils are equal, round, and reactive to light.  Neck: Normal range of motion. Neck supple.  Cardiovascular: Normal rate.   Pulmonary/Chest: Effort normal  and breath sounds normal. No respiratory distress.  Abdominal: Soft. She exhibits no distension. There is tenderness.  Musculoskeletal: Normal range of motion. She exhibits no edema and no tenderness.  Neurological: She is alert and oriented to person, place, and time. She has normal reflexes.  Skin: Skin is warm and dry.  Psychiatric: She has a normal mood and affect.    ED Course  Procedures (including critical care time)   Labs Reviewed  CBC WITH DIFFERENTIAL  COMPREHENSIVE METABOLIC PANEL  URINALYSIS, MICROSCOPIC ONLY  LIPASE, BLOOD   No results found.   No diagnosis found.    MDM  Gastroenteritis.  Zofran and IVF in the ER.  Patient feels better and is ready for discharge.  Temp 99.1 hr 77 on admission.  Labs unremarkable except potassium 3.2.  40 meq of K given in the ER.   Will get pcp from list to follow up with.  No further vomiting or diarrhea in the ER.  Return for worsening symptoms.     Labs Reviewed  COMPREHENSIVE METABOLIC PANEL - Abnormal; Notable for the following:    Potassium 3.2 (*)     All other components within normal limits  CBC WITH DIFFERENTIAL  URINALYSIS, MICROSCOPIC ONLY  LIPASE, BLOOD  LAB REPORT - SCANNED          Remi Haggard, NP 08/20/12 1217

## 2012-08-19 NOTE — ED Notes (Addendum)
Reports for about a week of vomiting and diarrhea with generalized body aches, occ chills. Pt concerned that she may have depression causing symptoms

## 2012-08-21 NOTE — ED Provider Notes (Signed)
Medical screening examination/treatment/procedure(s) were conducted as a shared visit with non-physician practitioner(s) and myself.  I personally evaluated the patient during the encounter.  History and physical consistent with gastroenteritis. Feeling better after iv fluids.  Donnetta Hutching, MD 08/21/12 [phone]

## 2012-10-13 ENCOUNTER — Inpatient Hospital Stay (HOSPITAL_COMMUNITY)
Admission: AD | Admit: 2012-10-13 | Discharge: 2012-10-13 | Disposition: A | Discharge status: Home / Self Care | Payer: BC Managed Care – PPO | Source: Ambulatory Visit | Attending: Obstetrics and Gynecology | Admitting: Obstetrics and Gynecology

## 2012-10-13 ENCOUNTER — Inpatient Hospital Stay (HOSPITAL_COMMUNITY): Payer: BC Managed Care – PPO

## 2012-10-13 ENCOUNTER — Encounter (HOSPITAL_COMMUNITY): Payer: Self-pay

## 2012-10-13 DIAGNOSIS — K5732 Diverticulitis of large intestine without perforation or abscess without bleeding: Secondary | ICD-10-CM | POA: Insufficient documentation

## 2012-10-13 DIAGNOSIS — N83209 Unspecified ovarian cyst, unspecified side: Secondary | ICD-10-CM | POA: Insufficient documentation

## 2012-10-13 DIAGNOSIS — R109 Unspecified abdominal pain: Secondary | ICD-10-CM | POA: Insufficient documentation

## 2012-10-13 HISTORY — DX: Essential (primary) hypertension: I10

## 2012-10-13 HISTORY — DX: Gastro-esophageal reflux disease without esophagitis: K21.9

## 2012-10-13 LAB — CBC WITH DIFFERENTIAL/PLATELET
Basophils Absolute: 0 10*3/uL (ref 0.0–0.1)
Basophils Relative: 0 % (ref 0–1)
MCHC: 34.4 g/dL (ref 30.0–36.0)
Neutro Abs: 6.4 10*3/uL (ref 1.7–7.7)
Neutrophils Relative %: 60 % (ref 43–77)
Platelets: 264 10*3/uL (ref 150–400)
RDW: 12.6 % (ref 11.5–15.5)
WBC: 10.6 10*3/uL — ABNORMAL HIGH (ref 4.0–10.5)

## 2012-10-13 LAB — URINALYSIS, ROUTINE W REFLEX MICROSCOPIC
Leukocytes, UA: NEGATIVE
Protein, ur: NEGATIVE mg/dL
Specific Gravity, Urine: >1.03 — ABNORMAL HIGH (ref 1.005–1.030)
Urobilinogen, UA: 0.2 mg/dL (ref 0.0–1.0)

## 2012-10-13 LAB — POCT PREGNANCY, URINE: Preg Test, Ur: NEGATIVE

## 2012-10-13 MED ORDER — KETOROLAC TROMETHAMINE 60 MG/2ML IM SOLN
60.0000 mg | Freq: Once | INTRAMUSCULAR | Status: AC
  Administered 2012-10-13: 60 mg via INTRAMUSCULAR
  Filled 2012-10-13: qty 2

## 2012-10-13 MED ORDER — OXYCODONE-ACETAMINOPHEN 5-325 MG PO TABS
1.0000 | ORAL_TABLET | Freq: Once | ORAL | Status: AC
  Administered 2012-10-13: 1 via ORAL
  Filled 2012-10-13: qty 1

## 2012-10-13 MED ORDER — OXYCODONE-ACETAMINOPHEN 5-325 MG PO TABS: 1.0000 | ORAL_TABLET | ORAL | Status: DC | PRN

## 2012-10-13 NOTE — MAU Note (Signed)
Patient is in with c/o llq pain and feeling chills. Patient states that she have intermittent pain with ovarian cyst but the past 3 days it have been too painful that she called out of work. lmp 10/02/12 (patient states that she have irregular period for years). She denies vaginal bleeding or discharge.

## 2012-10-13 NOTE — MAU Provider Note (Signed)
History     CSN: [id number]  Arrival date and time: 10/13/12 1948   None     Chief Complaint  Patient presents with  . Abdominal Pain   HPI Courtney Blackburn is a 56 y.o. female who presents to MAU with abdominal pain. The pain has been off and on for the past 3 months but worse for the past 3 days. She describes the pain as sharp. The pain is located in the left lower abdomen and radiates to the left lower back. She rates the pain as 10/10. Unable to work the past 3 days due to pain. She has taken tylenol, Advil and Excedrin for pain with out relief. Her last pap smear was one year ago and normal. Her last normal period was a year ago and since then has occasional spotting. She has not been sexually active in 6 years. The history was provided by the patient.  OB History    Grav Para Term Preterm Abortions TAB SAB Ect Mult Living   1         1      Past Medical History  Diagnosis Date  . Hiatal hernia   . Anxiety   . Depression   . Hypertension   . Acid reflux     Past Surgical History  Procedure Date  . Tubal ligation   . Appendectomy   . Wisdom tooth extraction     History reviewed. No pertinent family history.  History  Substance Use Topics  . Smoking status: Current Every Day Smoker -- 1.0 packs/day    Types: Cigarettes  . Smokeless tobacco: Not on file  . Alcohol Use: No    Allergies:  Allergies  Allergen Reactions  . Codeine Other (See Comments)    headaches    Prescriptions prior to admission  Medication Sig Dispense Refill  . acetaminophen (TYLENOL) 500 MG tablet Take 1,000 mg by mouth every 6 (six) hours as needed. Pain      . ibuprofen (ADVIL,MOTRIN) 200 MG tablet Take 600 mg by mouth every 6 (six) hours as needed. For pain.      Marland Kitchen omeprazole (PRILOSEC) 20 MG capsule Take 20 mg by mouth daily.      . [DISCONTINUED] aspirin-acetaminophen-caffeine (EXCEDRIN MIGRAINE) 250-250-65 MG per tablet Take 2 tablets by mouth every 6 (six) hours as needed.  headache      . [DISCONTINUED] naproxen sodium (ANAPROX) 220 MG tablet Take 440 mg by mouth daily as needed. headache        Review of Systems  Constitutional: Negative for fever, chills and weight loss.  HENT: Negative for ear pain, nosebleeds, congestion, sore throat and neck pain.   Eyes: Negative for blurred vision, double vision, photophobia and pain.  Respiratory: Negative for cough, shortness of breath and wheezing.   Cardiovascular: Negative for chest pain, palpitations and leg swelling.  Gastrointestinal: Positive for abdominal pain. Negative for heartburn, nausea, vomiting, diarrhea and constipation.  Genitourinary: Positive for urgency and frequency. Negative for dysuria.  Musculoskeletal: Positive for back pain. Negative for myalgias.  Skin: Negative for itching and rash.  Neurological: Negative for dizziness, sensory change, speech change, seizures, weakness and headaches.  Endo/Heme/Allergies: Does not bruise/bleed easily.  Psychiatric/Behavioral: Positive for depression. The patient is not nervous/anxious.    Physical Exam   BP 145/83  Pulse 87  Temp 98.7 F (37.1 C) (Oral)  Resp 18  LMP 10/02/2012  Physical Exam  Nursing note and vitals reviewed. Constitutional: She is oriented to person,  place, and time. She appears well-developed and well-nourished. No distress.  HENT:  Head: Normocephalic and atraumatic.  Eyes: EOM are normal.  Neck: Neck supple.  Cardiovascular: Normal rate.   Respiratory: Effort normal.  GI: Soft. Normal appearance and bowel sounds are normal. There is tenderness in the left lower quadrant. There is no rigidity, no rebound, no guarding and no CVA tenderness.  Genitourinary:       External genitalia without lesions. White discharge vaginal vault. Mild CMT, left adnexal tenderness. Uterus without palpable enlargement.  Musculoskeletal: Normal range of motion.  Neurological: She is alert and oriented to person, place, and time.  Skin: Skin is  warm and dry.  Psychiatric: She has a normal mood and affect. Her behavior is normal. Judgment and thought content normal.   Procedures Results for orders placed during the hospital encounter of 10/13/12 (from the past 24 hour(s))  URINALYSIS, ROUTINE W REFLEX MICROSCOPIC     Status: Abnormal   Collection Time   10/13/12  8:05 PM      Component Value Range   Color, Urine YELLOW  YELLOW   APPearance CLEAR  CLEAR   Specific Gravity, Urine >1.030 (*) 1.005 - 1.030   pH 5.0  5.0 - 8.0   Glucose, UA NEGATIVE  NEGATIVE mg/dL   Hgb urine dipstick NEGATIVE  NEGATIVE   Bilirubin Urine SMALL (*) NEGATIVE   Ketones, ur NEGATIVE  NEGATIVE mg/dL   Protein, ur NEGATIVE  NEGATIVE mg/dL   Urobilinogen, UA 0.2  0.0 - 1.0 mg/dL   Nitrite NEGATIVE  NEGATIVE   Leukocytes, UA NEGATIVE  NEGATIVE  POCT PREGNANCY, URINE     Status: Normal   Collection Time   10/13/12  8:11 PM      Component Value Range   Preg Test, Ur NEGATIVE  NEGATIVE  CBC WITH DIFFERENTIAL     Status: Abnormal   Collection Time   10/13/12  8:26 PM      Component Value Range   WBC 10.6 (*) 4.0 - 10.5 K/uL   RBC 4.06  3.87 - 5.11 MIL/uL   Hemoglobin 13.4  12.0 - 15.0 g/dL   HCT 16.1  09.6 - 04.5 %   MCV 95.8  78.0 - 100.0 fL   MCH 33.0  26.0 - 34.0 pg   MCHC 34.4  30.0 - 36.0 g/dL   RDW 40.9  81.1 - 91.4 %   Platelets 264  150 - 400 K/uL   Neutrophils Relative 60  43 - 77 %   Neutro Abs 6.4  1.7 - 7.7 K/uL   Lymphocytes Relative 31  12 - 46 %   Lymphs Abs 3.3  0.7 - 4.0 K/uL   Monocytes Relative 8  3 - 12 %   Monocytes Absolute 0.8  0.1 - 1.0 K/uL   Eosinophils Relative 1  0 - 5 %   Eosinophils Absolute 0.1  0.0 - 0.7 K/uL   Basophils Relative 0  0 - 1 %   Basophils Absolute 0.0  0.0 - 0.1 K/uL   US Transvaginal Non-ob  10/13/2012  *RADIOLOGY REPORT*  Clinical Data: Left pelvic pain.  TRANSABDOMINAL AND TRANSVAGINAL ULTRASOUND OF PELVIS Technique:  Both transabdominal and transvaginal ultrasound examinations of the  pelvis were performed. Transabdominal technique was performed for global imaging of the pelvis including uterus, ovaries, adnexal regions, and pelvic cul-de-sac.  It was necessary to proceed with endovaginal exam following the transabdominal exam to visualize the ovaries.  Comparison:  None  Findings:  Uterus:  Normal in size and appearance  Endometrium: Normal in thickness and appearance  Right ovary:  Not visualized.  Left ovary: Not visualized.  Other findings: No free fluid  IMPRESSION: Non-visualized ovaries.  Otherwise, normal examination.   Original Report Authenticated By: Beckie Salts, M.D.    US Pelvis Complete  10/13/2012  *RADIOLOGY REPORT*  Clinical Data: Left pelvic pain.  TRANSABDOMINAL AND TRANSVAGINAL ULTRASOUND OF PELVIS Technique:  Both transabdominal and transvaginal ultrasound examinations of the pelvis were performed. Transabdominal technique was performed for global imaging of the pelvis including uterus, ovaries, adnexal regions, and pelvic cul-de-sac.  It was necessary to proceed with endovaginal exam following the transabdominal exam to visualize the ovaries.  Comparison:  None  Findings:  Uterus: Normal in size and appearance  Endometrium: Normal in thickness and appearance  Right ovary:  Not visualized.  Left ovary: Not visualized.  Other findings: No free fluid  IMPRESSION: Non-visualized ovaries.  Otherwise, normal examination.   Original Report Authenticated By: Beckie Salts, M.D.     Assessment: 56 y.o. female with abdominal pain  Diff Dx: Ovarian cyst   Diverticulitis    Plan:  Discussed with Dr. Tenny Craw   Pain management   Follow up in the office, return here as needed.  I have reviewed this patient's vital signs, nurses notes, appropriate labs and imaging. I have discussed the results with the patient and she voices understanding.    Angelette, Ganus  Home Medication Instructions UEA:[id number]   Printed on:10/13/12 2132  Medication Information                     omeprazole (PRILOSEC) 20 MG capsule Take 20 mg by mouth daily.           ibuprofen (ADVIL,MOTRIN) 200 MG tablet Take 600 mg by mouth every 6 (six) hours as needed. For pain.           acetaminophen (TYLENOL) 500 MG tablet Take 1,000 mg by mouth every 6 (six) hours as needed. Pain           oxyCODONE-acetaminophen (PERCOCET/ROXICET) 5-325 MG per tablet Take 1 tablet by mouth every 4 (four) hours as needed for pain.            Theresia Pree, RN, FNP, [facility] 10/13/2012, 9:33 PM

## 2012-10-25 ENCOUNTER — Encounter (HOSPITAL_COMMUNITY): Payer: Self-pay

## 2012-10-25 ENCOUNTER — Inpatient Hospital Stay (HOSPITAL_COMMUNITY)
Admission: AD | Admit: 2012-10-25 | Discharge: 2012-10-25 | Disposition: A | Discharge status: Home / Self Care | Payer: Self-pay | Source: Ambulatory Visit | Attending: Obstetrics and Gynecology | Admitting: Obstetrics and Gynecology

## 2012-10-25 DIAGNOSIS — K529 Noninfective gastroenteritis and colitis, unspecified: Secondary | ICD-10-CM

## 2012-10-25 DIAGNOSIS — R197 Diarrhea, unspecified: Secondary | ICD-10-CM | POA: Insufficient documentation

## 2012-10-25 DIAGNOSIS — K5289 Other specified noninfective gastroenteritis and colitis: Secondary | ICD-10-CM | POA: Insufficient documentation

## 2012-10-25 DIAGNOSIS — K589 Irritable bowel syndrome without diarrhea: Secondary | ICD-10-CM | POA: Insufficient documentation

## 2012-10-25 DIAGNOSIS — R1032 Left lower quadrant pain: Secondary | ICD-10-CM | POA: Insufficient documentation

## 2012-10-25 LAB — URINALYSIS, ROUTINE W REFLEX MICROSCOPIC
Leukocytes, UA: NEGATIVE
Nitrite: NEGATIVE
Specific Gravity, Urine: >1.03 — ABNORMAL HIGH (ref 1.005–1.030)
Urobilinogen, UA: 1 mg/dL (ref 0.0–1.0)

## 2012-10-25 LAB — OCCULT BLOOD X 1 CARD TO LAB, STOOL: Fecal Occult Bld: NEGATIVE

## 2012-10-25 MED ORDER — TRAMADOL HCL 50 MG PO TABS
50.0000 mg | ORAL_TABLET | Freq: Once | ORAL | Status: AC
  Administered 2012-10-25: 50 mg via ORAL
  Filled 2012-10-25: qty 1

## 2012-10-25 NOTE — MAU Provider Note (Signed)
History     CSN: [id number]  Arrival date and time: 10/25/12 2005   First Provider Initiated Contact with Patient 10/25/12 2154      Chief Complaint  Patient presents with  . Diarrhea   HPI 56 y.o. non-pregnant female with nausea, diarrhea and left lower quadrant pain. Intermittent. Has been going on for 6 months, occurs at least once a month. Seen in MAU on 10/13/12 for same problem. Got better for about a week. Current episode started 12/25. Diarrhea with nausea. Has not eaten in 4 days but drinking liquids. No appetite, not hungry.  Diarrhea 6-7 times a day, small amounts, very watery, sometimes black or green. No frank blood. Has abdominal cramping with each episode. Pain between bouts of diarrhea in LLQ - achy and sharp, intermittent. Nothing makes pain better. Worse when having diarrhea. Chills but no fever.  Voiding normally, no dysuria, urgency or frequency. Taking imodium - usually helps within a few days and just started two days ago. Also taking motrin/tylenol. Takes 4 motrin 6 x a day. Tylenol - 2 at a time no more than 4 times a day.  Has lost 50 lbs in last 6 months. Has not seen a doctor because no insurance. Has applied for insurance, will start January 1.  Pertinent Gynecological History: Menses: spotting irregularly - last month and 6 months before that. Does not remember last actual period.   Work-up in MAU on 12/16 - normal pelvic ultrasound, CBC (white count 10.6).    Past Medical History  Diagnosis Date  . Hiatal hernia   . Anxiety   . Depression   . Hypertension   . Acid reflux     Past Surgical History  Procedure Date  . Tubal ligation   . Appendectomy   . Wisdom tooth extraction    Fam Hx:  Sister breast cancer, mother polymyositis/arthritis   History  Substance Use Topics  . Smoking status: Current Every Day Smoker -- <1.0 packs/day    Types: Cigarettes  . Smokeless tobacco: Not on file  . Alcohol Use: No    Allergies:  Allergies  Allergen  Reactions  . Codeine Other (See Comments)    headaches    Prescriptions prior to admission  Medication Sig Dispense Refill  . acetaminophen (TYLENOL) 500 MG tablet Take 1,000 mg by mouth every 6 (six) hours as needed. Pain      . ibuprofen (ADVIL,MOTRIN) 200 MG tablet Take 600 mg by mouth every 6 (six) hours as needed. For pain.      Marland Kitchen omeprazole (PRILOSEC) 20 MG capsule Take 20 mg by mouth daily.      Marland Kitchen oxyCODONE-acetaminophen (PERCOCET/ROXICET) 5-325 MG per tablet Take 1 tablet by mouth every 4 (four) hours as needed for pain.  6 tablet  0    Review of Systems  Constitutional: Positive for chills. Negative for fever.  Eyes: Negative for blurred vision and double vision.  Respiratory: Negative for shortness of breath.   Cardiovascular: Negative for chest pain and palpitations.  Gastrointestinal: Positive for nausea, abdominal pain, diarrhea and melena (occiasionally black). Negative for heartburn, vomiting, constipation and blood in stool.  Genitourinary: Negative for dysuria, urgency and frequency.  Musculoskeletal: Negative for joint pain.  Skin: Negative for rash.  Neurological: Negative for dizziness and headaches.   Physical Exam   Blood pressure 161/72, pulse 89, temperature 98.6 F (37 C), temperature source Oral, resp. rate 20, height 5' 1.5" (1.562 m), weight 55.52 kg (122 lb 6.4 oz), last menstrual period  10/02/2012.  Physical Exam  Constitutional: She is oriented to person, place, and time. She appears well-developed and well-nourished. No distress.  HENT:  Head: Normocephalic and atraumatic.  Eyes: Conjunctivae normal and EOM are normal.  Neck: Normal range of motion. Neck supple.  Cardiovascular: Normal rate, regular rhythm and normal heart sounds.   Respiratory: Effort normal and breath sounds normal. No respiratory distress.  GI: Soft. Bowel sounds are normal. She exhibits no distension. There is tenderness (Mild LLQ tenderness). There is no rebound and no  guarding.  Genitourinary:       Rectal exam:  Minimal stool in vault, soft. Normal sphincter tone. Small external hemorrhoid at 12:00.   Musculoskeletal: Normal range of motion. She exhibits no edema and no tenderness.  Neurological: She is alert and oriented to person, place, and time.  Skin: Skin is warm and dry.  Psychiatric: She has a normal mood and affect.   Results for orders placed during the hospital encounter of 10/25/12 (from the past 24 hour(s))  URINALYSIS, ROUTINE W REFLEX MICROSCOPIC     Status: Abnormal   Collection Time   10/25/12  8:37 PM      Component Value Range   Color, Urine YELLOW  YELLOW   APPearance CLEAR  CLEAR   Specific Gravity, Urine >1.030 (*) 1.005 - 1.030   pH 6.0  5.0 - 8.0   Glucose, UA NEGATIVE  NEGATIVE mg/dL   Hgb urine dipstick NEGATIVE  NEGATIVE   Bilirubin Urine SMALL (*) NEGATIVE   Ketones, ur NEGATIVE  NEGATIVE mg/dL   Protein, ur NEGATIVE  NEGATIVE mg/dL   Urobilinogen, UA 1.0  0.0 - 1.0 mg/dL   Nitrite NEGATIVE  NEGATIVE   Leukocytes, UA NEGATIVE  NEGATIVE  OCCULT BLOOD X 1 CARD TO LAB, STOOL     Status: Normal   Collection Time   10/25/12 10:30 PM      Component Value Range   Fecal Occult Bld NEGATIVE  NEGATIVE    MAU Course  Procedures   Assessment and Plan  56 y.o. non-pregnant female with chronic diarrhea and LLQ pain.  - Occult blood negative - Non-acute abdomen, hemodynamically stable. Normal pelvic sono on 12/16 with no change in symptoms since then. - IBS vs colitis. Recommend f/u with GI. Gave patient a list of primary care sources. - Precautions to return to hospital for fever/vomiting/severe pain/blood in stool or emesis - Discussed with Dr. Claiborne Billings - agrees with plan. No pain meds except tylenol or ibuprofen.  Napoleon Form 10/25/2012, 9:55 PM

## 2012-10-25 NOTE — MAU Note (Signed)
Have had diarrhea for couple wks off and on. None today. They think I may have diverticulosis. Ins doesn't kick in till jan 1st. Some nausea and no appetite. Loosing wt. Having a lot of pain in my LLQ.

## 2013-11-19 ENCOUNTER — Encounter (HOSPITAL_COMMUNITY): Payer: Self-pay | Admitting: Emergency Medicine

## 2013-11-19 ENCOUNTER — Emergency Department (HOSPITAL_COMMUNITY)
Admission: EM | Admit: 2013-11-19 | Discharge: 2013-11-20 | Disposition: A | Discharge status: Home / Self Care | Payer: Self-pay | Attending: Emergency Medicine | Admitting: Emergency Medicine

## 2013-11-19 ENCOUNTER — Emergency Department (HOSPITAL_COMMUNITY): Payer: Self-pay

## 2013-11-19 DIAGNOSIS — IMO0001 Reserved for inherently not codable concepts without codable children: Secondary | ICD-10-CM | POA: Insufficient documentation

## 2013-11-19 DIAGNOSIS — R599 Enlarged lymph nodes, unspecified: Secondary | ICD-10-CM | POA: Insufficient documentation

## 2013-11-19 DIAGNOSIS — R350 Frequency of micturition: Secondary | ICD-10-CM | POA: Insufficient documentation

## 2013-11-19 DIAGNOSIS — J029 Acute pharyngitis, unspecified: Secondary | ICD-10-CM | POA: Insufficient documentation

## 2013-11-19 DIAGNOSIS — R5383 Other fatigue: Secondary | ICD-10-CM

## 2013-11-19 DIAGNOSIS — R059 Cough, unspecified: Secondary | ICD-10-CM | POA: Insufficient documentation

## 2013-11-19 DIAGNOSIS — I10 Essential (primary) hypertension: Secondary | ICD-10-CM | POA: Insufficient documentation

## 2013-11-19 DIAGNOSIS — Z79899 Other long term (current) drug therapy: Secondary | ICD-10-CM | POA: Insufficient documentation

## 2013-11-19 DIAGNOSIS — Z8659 Personal history of other mental and behavioral disorders: Secondary | ICD-10-CM | POA: Insufficient documentation

## 2013-11-19 DIAGNOSIS — R630 Anorexia: Secondary | ICD-10-CM | POA: Insufficient documentation

## 2013-11-19 DIAGNOSIS — R6883 Chills (without fever): Secondary | ICD-10-CM | POA: Insufficient documentation

## 2013-11-19 DIAGNOSIS — R5381 Other malaise: Secondary | ICD-10-CM | POA: Insufficient documentation

## 2013-11-19 DIAGNOSIS — R52 Pain, unspecified: Secondary | ICD-10-CM | POA: Insufficient documentation

## 2013-11-19 DIAGNOSIS — R05 Cough: Secondary | ICD-10-CM | POA: Insufficient documentation

## 2013-11-19 DIAGNOSIS — F17200 Nicotine dependence, unspecified, uncomplicated: Secondary | ICD-10-CM | POA: Insufficient documentation

## 2013-11-19 DIAGNOSIS — K219 Gastro-esophageal reflux disease without esophagitis: Secondary | ICD-10-CM | POA: Insufficient documentation

## 2013-11-19 NOTE — ED Notes (Signed)
Pt c/o cough, congestion, weakness since Monday. Pt states, "i really feel exhausted.

## 2013-11-19 NOTE — ED Provider Notes (Signed)
CSN: [id number]     Arrival date & time 11/19/13  2012 History  This chart was scribed for non-physician practitioner Almyra Brace, PA-C working with No att. providers found by Joaquin Music, ED Scribe. This patient was seen in room WTR9/WTR9 and the patient's care was started at 11:16 PM .  Chief Complaint  Patient presents with  . Cough  . Nasal Congestion  . Weakness   The history is provided by the patient. No language interpreter was used.  HPI Comments: SUHANI STILLION is a 58 y.o. female who presents to the Emergency Department complaining of fatigue, generalized weakness, body aches, chills and anorexia x almost 2 weeks.  Sx worsened 4 days ago and she was unable to get out of bed for three days.  Associated w/ mild cough and mild sore throat as well as increased urinary frequency.  Had nasal congestion and right ear pain at onset, both have resolved.  Denies fever, CP, SOB, N/V/D, abd pain, hematochezia/melena, vaginal and other urinary sx.  No recent weight loss; weight tends to fluctuate.  Smokes 1PPD.  No known sick contacts.     Past Medical History  Diagnosis Date  . Hiatal hernia   . Anxiety   . Depression   . Hypertension   . Acid reflux    Past Surgical History  Procedure Laterality Date  . Tubal ligation    . Appendectomy    . Wisdom tooth extraction     History reviewed. No pertinent family history. History  Substance Use Topics  . Smoking status: Current Every Day Smoker -- 1.00 packs/day    Types: Cigarettes  . Smokeless tobacco: Not on file  . Alcohol Use: No   OB History   Grav Para Term Preterm Abortions TAB SAB Ect Mult Living   1         1     Review of Systems  All other systems reviewed and are negative.    Allergies  Codeine  Home Medications   Current Outpatient Rx  Name  Route  Sig  Dispense  Refill  . acetaminophen (TYLENOL) 500 MG tablet   Oral   Take 1,000 mg by mouth every 6 (six) hours as needed. Pain        . ibuprofen (ADVIL,MOTRIN) 200 MG tablet   Oral   Take 600 mg by mouth every 6 (six) hours as needed. For pain.         Marland Kitchen omeprazole (PRILOSEC) 20 MG capsule   Oral   Take 20 mg by mouth daily.         Marland Kitchen oxyCODONE-acetaminophen (PERCOCET/ROXICET) 5-325 MG per tablet   Oral   Take 1 tablet by mouth every 4 (four) hours as needed for pain.   6 tablet   0    BP 145/71  Pulse 72  Temp(Src) 98.1 F (36.7 C) (Oral)  Resp 18  SpO2 100%  LMP 10/02/2012 Physical Exam  Nursing note and vitals reviewed. Constitutional: She is oriented to person, place, and time. She appears well-developed and well-nourished. No distress.  HENT:  Head: Normocephalic and atraumatic.  Mouth/Throat: Oropharynx is clear and moist.  Posterior pharynx mildly erythematous.  No tonsillar edema or exudate.  Uvula mid-line.  No trismus.    Eyes:  Normal appearance  Neck: Normal range of motion. Neck supple.  Cardiovascular: Normal rate and regular rhythm.   Pulmonary/Chest: Effort normal and breath sounds normal. No respiratory distress.  Abdominal: Soft. Bowel sounds are normal. She  exhibits no distension and no mass. There is no tenderness. There is no rebound and no guarding.  Genitourinary:  No CVA tenderness  Musculoskeletal: Normal range of motion.  Lymphadenopathy:    She has cervical adenopathy.  Neurological: She is alert and oriented to person, place, and time.  Skin: Skin is warm and dry. No rash noted.  Psychiatric: She has a normal mood and affect. Her behavior is normal.    ED Course  Procedures (including critical care time) Labs Review Labs Reviewed  BASIC METABOLIC PANEL - Abnormal; Notable for the following:    Potassium 3.5 (*)    Glucose, Bld 115 (*)    GFR calc non Af Amer 62 (*)    GFR calc Af Amer 72 (*)    All other components within normal limits  URINALYSIS, ROUTINE W REFLEX MICROSCOPIC  CBC WITH DIFFERENTIAL   Imaging Review Dg Chest 2 View  11/19/2013    CLINICAL DATA:  Cough.  Nasal congestion.  Weakness.  EXAM: CHEST  2 VIEW  COMPARISON:  10/13/2009  FINDINGS: The heart size and mediastinal contours are within normal limits. Both lungs are clear. The visualized skeletal structures are unremarkable.  IMPRESSION: Normal exam.   Electronically Signed   By: Geanie CooleyJim  Maxwell M.D.   On: 11/19/2013 21:39    EKG Interpretation   None      MDM   1. Fatigue    Pt presents w/ body aches, chills, fatigue, anorexia x 2 weeks.  Associated w/ mild cough and sore throat as well as increased urinary frequency.  Afebrile, non-toxic appearance, unremarkable ENT, cervical adenopathy, lungs clear, abd benign on exam.  U/A and basic labs pending.  CXR non-acute. Suspect viral respiratory infection.  12:15 AM   Labs unremarkable. Results discussed w/ pt.  I recommended f/u with a primary care doctor for further evaluation.  Return precautions discussed. 1:06 AM   I personally performed the services described in this documentation, which was scribed in my presence. The recorded information has been reviewed and is accurate.    Otilio Miuatherine E Pratik Dalziel, PA-C 11/20/13 0730

## 2013-11-20 LAB — CBC WITH DIFFERENTIAL/PLATELET
Basophils Absolute: 0 10*3/uL (ref 0.0–0.1)
Basophils Relative: 0 % (ref 0–1)
EOS ABS: 0.1 10*3/uL (ref 0.0–0.7)
Eosinophils Relative: 1 % (ref 0–5)
HCT: 42.7 % (ref 36.0–46.0)
HEMOGLOBIN: 14.9 g/dL (ref 12.0–15.0)
LYMPHS ABS: 3.7 10*3/uL (ref 0.7–4.0)
LYMPHS PCT: 38 % (ref 12–46)
MCH: 33.4 pg (ref 26.0–34.0)
MCHC: 34.9 g/dL (ref 30.0–36.0)
MCV: 95.7 fL (ref 78.0–100.0)
MONOS PCT: 6 % (ref 3–12)
Monocytes Absolute: 0.6 10*3/uL (ref 0.1–1.0)
NEUTROS PCT: 55 % (ref 43–77)
Neutro Abs: 5.4 10*3/uL (ref 1.7–7.7)
PLATELETS: 291 10*3/uL (ref 150–400)
RBC: 4.46 MIL/uL (ref 3.87–5.11)
RDW: 12.7 % (ref 11.5–15.5)
WBC: 9.8 10*3/uL (ref 4.0–10.5)

## 2013-11-20 LAB — URINALYSIS, ROUTINE W REFLEX MICROSCOPIC
BILIRUBIN URINE: NEGATIVE
Glucose, UA: NEGATIVE mg/dL
HGB URINE DIPSTICK: NEGATIVE
Ketones, ur: NEGATIVE mg/dL
Leukocytes, UA: NEGATIVE
NITRITE: NEGATIVE
PH: 5.5 (ref 5.0–8.0)
Protein, ur: NEGATIVE mg/dL
SPECIFIC GRAVITY, URINE: 1.014 (ref 1.005–1.030)
UROBILINOGEN UA: 0.2 mg/dL (ref 0.0–1.0)

## 2013-11-20 LAB — BASIC METABOLIC PANEL
BUN: 16 mg/dL (ref 6–23)
CHLORIDE: 97 meq/L (ref 96–112)
CO2: 24 meq/L (ref 19–32)
Calcium: 9.2 mg/dL (ref 8.4–10.5)
Creatinine, Ser: 1 mg/dL (ref 0.50–1.10)
GFR calc Af Amer: 72 mL/min — ABNORMAL LOW (ref >90)
GFR calc non Af Amer: 62 mL/min — ABNORMAL LOW (ref >90)
GLUCOSE: 115 mg/dL — AB (ref 70–99)
POTASSIUM: 3.5 meq/L — AB (ref 3.7–5.3)
SODIUM: 137 meq/L (ref 137–147)

## 2013-11-20 NOTE — Discharge Instructions (Signed)
Get rest and drink plenty of fluids.  Follow up with a primary care doctor asap.  Please return to the ER if your symptoms worsens.   Fatigue Fatigue is a feeling of tiredness, lack of energy, lack of motivation, or feeling tired all the time. Having enough rest, good nutrition, and reducing stress will normally reduce fatigue. Consult your caregiver if it persists. The nature of your fatigue will help your caregiver to find out its cause. The treatment is based on the cause.  CAUSES  There are many causes for fatigue. Most of the time, fatigue can be traced to one or more of your habits or routines. Most causes fit into one or more of three general areas. They are: Lifestyle problems  Sleep disturbances.  Overwork.  Physical exertion.  Unhealthy habits.  Poor eating habits or eating disorders.  Alcohol and/or drug use .  Lack of proper nutrition (malnutrition). Psychological problems  Stress and/or anxiety problems.  Depression.  Grief.  Boredom. Medical Problems or Conditions  Anemia.  Pregnancy.  Thyroid gland problems.  Recovery from major surgery.  Continuous pain.  Emphysema or asthma that is not well controlled  Allergic conditions.  Diabetes.  Infections (such as mononucleosis).  Obesity.  Sleep disorders, such as sleep apnea.  Heart failure or other heart-related problems.  Cancer.  Kidney disease.  Liver disease.  Effects of certain medicines such as antihistamines, cough and cold remedies, prescription pain medicines, heart and blood pressure medicines, drugs used for treatment of cancer, and some antidepressants. SYMPTOMS  The symptoms of fatigue include:   Lack of energy.  Lack of drive (motivation).  Drowsiness.  Feeling of indifference to the surroundings. DIAGNOSIS  The details of how you feel help guide your caregiver in finding out what is causing the fatigue. You will be asked about your present and past health condition. It  is important to review all medicines that you take, including prescription and non-prescription items. A thorough exam will be done. You will be questioned about your feelings, habits, and normal lifestyle. Your caregiver may suggest blood tests, urine tests, or other tests to look for common medical causes of fatigue.  TREATMENT  Fatigue is treated by correcting the underlying cause. For example, if you have continuous pain or depression, treating these causes will improve how you feel. Similarly, adjusting the dose of certain medicines will help in reducing fatigue.  HOME CARE INSTRUCTIONS   Try to get the required amount of good sleep every night.  Eat a healthy and nutritious diet, and drink enough water throughout the day.  Practice ways of relaxing (including yoga or meditation).  Exercise regularly.  Make plans to change situations that cause stress. Act on those plans so that stresses decrease over time. Keep your work and personal routine reasonable.  Avoid street drugs and minimize use of alcohol.  Start taking a daily multivitamin after consulting your caregiver. SEEK MEDICAL CARE IF:   You have persistent tiredness, which cannot be accounted for.  You have fever.  You have unintentional weight loss.  You have headaches.  You have disturbed sleep throughout the night.  You are feeling sad.  You have constipation.  You have dry skin.  You have gained weight.  You are taking any new or different medicines that you suspect are causing fatigue.  You are unable to sleep at night.  You develop any unusual swelling of your legs or other parts of your body. SEEK IMMEDIATE MEDICAL CARE IF:  You are feeling confused.  Your vision is blurred.  You feel faint or pass out.  You develop severe headache.  You develop severe abdominal, pelvic, or back pain.  You develop chest pain, shortness of breath, or an irregular or fast heartbeat.  You are unable to pass a  normal amount of urine.  You develop abnormal bleeding such as bleeding from the rectum or you vomit blood.  You have thoughts about harming yourself or committing suicide.  You are worried that you might harm someone else. MAKE SURE YOU:   Understand these instructions.  Will watch your condition.  Will get help right away if you are not doing well or get worse. Document Released: 08/12/2007 Document Revised: 01/07/2012 Document Reviewed: 08/12/2007 [facility] Patient Information 2014 [place], [place].   Emergency Department Resource Guide 1) Find a Doctor and Pay Out of Pocket Although you won't have to find out who is covered by your insurance plan, it is a good idea to ask around and get recommendations. You will then need to call the office and see if the doctor you have chosen will accept you as a new patient and what types of options they offer for patients who are self-pay. Some doctors offer discounts or will set up payment plans for their patients who do not have insurance, but you will need to ask so you aren't surprised when you get to your appointment.  2) Contact Your Local Health Department Not all health departments have doctors that can see patients for sick visits, but many do, so it is worth a call to see if yours does. If you don't know where your local health department is, you can check in your phone book. The CDC also has a tool to help you locate your state's health department, and many state websites also have listings of all of their local health departments.  3) Find a Walk-in Clinic If your illness is not likely to be very severe or complicated, you may want to try a walk in clinic. These are popping up all over the country in pharmacies, drugstores, and shopping centers. They're usually staffed by nurse practitioners or physician assistants that have been trained to treat common illnesses and complaints. They're usually fairly quick and inexpensive. However, if you  have serious medical issues or chronic medical problems, these are probably not your best option.  No Primary Care Doctor: - Call Health Connect at  [phone] - they can help you locate a primary care doctor that  accepts your insurance, provides certain services, etc. - Physician Referral Service- [phone]  Chronic Pain Problems: Organization         Address  Phone   Notes  [facility]  [phone] Patients need to be referred by their primary care doctor.   Medication Assistance: Organization         Address  Phone   Notes  [facility] [facility] [address] [place], [place] [id number] [phone] --Must be a resident of [place] -- Must have NO insurance coverage whatsoever (no Medicaid/ Medicare, etc.) -- The pt. MUST have a primary care doctor that directs their care regularly and follows them in the community   MedAssist  [phone]   [employer]  [phone]    Agencies that provide inexpensive medical care: Organization         Address  Phone   Notes  [facility]  [phone]  [facility]    [phone]   [facility] [address] [place], [place] [address] [phone]   [facility] [place] [address] [place] [place] [phone]   Planned Parenthood    [phone]   [facility]    [phone]   Community Health and [facility]  [address], [place] Phone:  [phone], Fax:  [phone] Hours of Operation:  9 am - 6 pm, M-F.  Also accepts Medicaid/Medicare and self-pay.  [facility]  [address], [place] Phone: [phone], Fax: [phone]. Hours of Operation:  8:30 am - 5:30 pm, M-F.  Also accepts Medicaid and self-pay.  [facility] [place] [address], [place] Phone: [phone]   [facility] [address]  Natasha Bence [place], [place] [phone], Ext. [phone] Mondays & Thursdays: 7-9 AM.  First 15 patients are seen on a first come, first serve basis.    Medicaid-accepting [facility] Providers:  Organization         Address  Phone   Notes  [facility] [address], [place] [phone] Also accepts self-pay patients.  [facility] [address] Laurell Josephs [place], [place]  [phone]   [facility] [address], [place] [phone]   [facility] [address], [place] [phone]   Renaye Rakers [address], [place]   [phone] Only accepts [place] Access [place] patients after they have their name applied to their card.   Self-Pay (no insurance) in [facility]:  Organization         Address  Phone   Notes  Sickle Cell Patients, [facility] [address] [place], [place] [phone]   [facility] [address] [place], [place] [phone]   [facility] [place]  [address], [place] [phone]   [facility]/Dr. Osei-Bonsu  [address] [place] or [address], [place] [phone] Phone number for both [place] and [place] locations is the same.  Urgent Medical and [facility] [address], [place] [phone]   [facility] [address] [place] or [address] [phone]   [facility] [address] [place] [phone], phone; [phone], fax Sees patients 1st and 3rd Saturday of every month.  Must not qualify for public or private insurance (i.e. Medicaid, Medicare, [place] Health Choice, Veterans' Benefits)  Household income should be no more than 200% of the poverty level The clinic cannot treat you if you are pregnant or think you are pregnant   Sexually transmitted diseases are not treated at the clinic.    Dental Care: Organization         Address  Phone  Notes  [facility] of [facility] [address] [place], [place] [phone] Accepts children up to age 31 who are enrolled in [place] or [place] Health Choice; pregnant women with a Medicaid card; and children who have applied for Medicaid or [place] Health Choice, but were declined, whose parents can pay a reduced fee at time of service.  [facility] of [facility]  [address],  [place] [phone] Accepts children up to age 24 who are enrolled in Medicaid or [place] Health Choice; pregnant women with a Medicaid card; and children who have applied for Medicaid or [place] Health Choice, but were declined, whose parents can pay a reduced fee at time of service.  [place] Adult Dental Access PROGRAM  [place] [phone] Patients are seen by appointment only. Walk-ins are not accepted. [place] will see patients 57 years of age and older. Monday - Tuesday (8am-5pm) Most Wednesdays (8:30-5pm) $30 per visit, cash only  [facility] Adult [facility] Access PROGRAM  [address], [facility] [phone] Patients are seen by appointment only. Walk-ins are not accepted. [place] will see patients 53 years of age and older. One Wednesday Evening (Monthly: Volunteer Based).  $30 per visit, cash only  [place]  [phone] for adults; Children under age 35, call Graduate Pediatric Dentistry at [phone]. Children aged 86-14, please call [phone] to request a pediatric application.  Dental services are provided in all areas of dental care including fillings, crowns and bridges, complete and partial dentures, implants, gum treatment, root canals, and extractions. Preventive care is also provided. Treatment is provided to both adults and children. Patients  are selected via a lottery and there is often a waiting list.   [facility] [address], [place]  [phone] [email or web address]   [facility] [address] [place], [place] [phone], Ext. [phone] Second and Fourth Thursday of each month, opens at 6:30 AM; Clinic ends at 9 AM.  Patients are seen on a first-come first-served basis, and a limited number are seen during each clinic.   [facility]  [address] Hillard Danker [place], [place] [phone]   Eligibility Requirements You must have lived in [place], [place], or [place] counties for at least the last three months.   You cannot be eligible for state or federal sponsored [employer], including [employer], [place], or [employer].   You generally cannot be eligible for healthcare insurance through your employer.    How to apply: Eligibility screenings are held every Tuesday and Wednesday afternoon from 1:00 pm until 4:00 pm. You do not need an appointment for the interview!  [facility] [address] [place], [place]   [place]  [place] Department  [place]  [phone]    Behavioral Health Resources in the Community: Intensive Outpatient Programs Organization         Address  Phone  Notes  Joseph [place]. [address] [place], [place] [phone]   [facility] [address], [place], [place]   ADS: Alcohol & Drug Svcs [address], [place], [place]   [place] [address]  [place], [place] or [phone]   Substance Abuse Resources Organization         Address  Phone  Notes  Alcohol and Drug Services  [phone]   [place]  [phone]   [facility]   Chinita Pester  [phone]    Residential & Outpatient Substance Abuse Program  [phone]   Psychological Services Organization         Address  Phone  Notes  [place]  336-  [place] [address] [place] or [phone]    Mobile Crisis Teams Organization         Address  Phone  Notes  [facility], Mobile Crisis Care Unit  [phone]   [facility]  [address] [place], [place]   [place] Levels [address], [place] [phone]    Self-Help/Support Groups Organization         Address  Phone             Notes  [place]. of [place] - variety of support groups  [place] Call for more information  Narcotics Anonymous (NA), [facility]aring [facility] [address], [employer] [place]  2 meetings at this location   Special educational needs teacher         Address  Phone  Notes  ASAP Residential Treatment [place],    [place]  [phone]   [facility]  [address], [place] [phone], [place], [place]   [place], [place] [phone] Admissions: 8am-3pm M-F  Incentives Substance [place] [address]    [place], [place] [phone]   [facility] [address] [place], [place], [place]   [facility] [address],  [place], [place]   Insight Programs - Intensive Outpatient [address] Kristeen Mans [address], [place], [place]   [facility] ([place] [place].,  [place], [place] [phone] or [phone]   [facility] (RTS) [address], [place], [place] Accepts Medicaid  [facility] [address]  [place] [phone] Substance Abuse/Addiction Treatment   [facility] Organization         Address  Phone  Notes  [facility]  [phone]   Domenic Schwab, PhD [address] Arlis Porta [place], [place]   [phone] or [phone]   [place] [address] [place], [place] [phone]   [facility] [address], [place], [place] [phone] Insurance/Medicaid/sponsorship through [facility] and Families [address]                                    [place], [place] [phone] [place] [address], [place] [phone]    Dr. Adele Schilder  [phone]   [facility] Dept. 1) [address] [place] 2) [place] 3)  [place] [address], [place] [phone]  [phone]   [place] [phone] or [phone] (After Hours)

## 2013-11-20 NOTE — ED Provider Notes (Signed)
Medical screening examination/treatment/procedure(s) were performed by non-physician practitioner and as supervising physician I was immediately available for consultation/collaboration.  EKG Interpretation   None         Lynleigh Kovack, MD 11/20/13 0746 

## 2014-02-17 ENCOUNTER — Emergency Department (HOSPITAL_COMMUNITY)
Admission: EM | Admit: 2014-02-17 | Discharge: 2014-02-17 | Disposition: A | Discharge status: Home / Self Care | Payer: BC Managed Care – PPO | Attending: Emergency Medicine | Admitting: Emergency Medicine

## 2014-02-17 ENCOUNTER — Emergency Department (HOSPITAL_COMMUNITY): Payer: BC Managed Care – PPO

## 2014-02-17 ENCOUNTER — Encounter (HOSPITAL_COMMUNITY): Payer: Self-pay | Admitting: Emergency Medicine

## 2014-02-17 DIAGNOSIS — Z79899 Other long term (current) drug therapy: Secondary | ICD-10-CM | POA: Insufficient documentation

## 2014-02-17 DIAGNOSIS — R0602 Shortness of breath: Secondary | ICD-10-CM | POA: Insufficient documentation

## 2014-02-17 DIAGNOSIS — K219 Gastro-esophageal reflux disease without esophagitis: Secondary | ICD-10-CM | POA: Insufficient documentation

## 2014-02-17 DIAGNOSIS — M5431 Sciatica, right side: Secondary | ICD-10-CM

## 2014-02-17 DIAGNOSIS — F17200 Nicotine dependence, unspecified, uncomplicated: Secondary | ICD-10-CM | POA: Insufficient documentation

## 2014-02-17 DIAGNOSIS — F329 Major depressive disorder, single episode, unspecified: Secondary | ICD-10-CM | POA: Insufficient documentation

## 2014-02-17 DIAGNOSIS — R5383 Other fatigue: Secondary | ICD-10-CM

## 2014-02-17 DIAGNOSIS — F419 Anxiety disorder, unspecified: Secondary | ICD-10-CM

## 2014-02-17 DIAGNOSIS — I10 Essential (primary) hypertension: Secondary | ICD-10-CM | POA: Insufficient documentation

## 2014-02-17 DIAGNOSIS — R002 Palpitations: Secondary | ICD-10-CM | POA: Insufficient documentation

## 2014-02-17 DIAGNOSIS — F411 Generalized anxiety disorder: Secondary | ICD-10-CM | POA: Insufficient documentation

## 2014-02-17 DIAGNOSIS — R110 Nausea: Secondary | ICD-10-CM | POA: Insufficient documentation

## 2014-02-17 DIAGNOSIS — F3289 Other specified depressive episodes: Secondary | ICD-10-CM | POA: Insufficient documentation

## 2014-02-17 DIAGNOSIS — R5381 Other malaise: Secondary | ICD-10-CM | POA: Insufficient documentation

## 2014-02-17 DIAGNOSIS — M5430 Sciatica, unspecified side: Secondary | ICD-10-CM | POA: Insufficient documentation

## 2014-02-17 DIAGNOSIS — R42 Dizziness and giddiness: Secondary | ICD-10-CM | POA: Insufficient documentation

## 2014-02-17 LAB — COMPREHENSIVE METABOLIC PANEL
ALT: 9 U/L (ref 0–35)
AST: 12 U/L (ref 0–37)
Albumin: 3.6 g/dL (ref 3.5–5.2)
Alkaline Phosphatase: 108 U/L (ref 39–117)
BUN: 8 mg/dL (ref 6–23)
CALCIUM: 9.6 mg/dL (ref 8.4–10.5)
CO2: 28 meq/L (ref 19–32)
CREATININE: 0.71 mg/dL (ref 0.50–1.10)
Chloride: 101 mEq/L (ref 96–112)
GLUCOSE: 97 mg/dL (ref 70–99)
Potassium: 3.7 mEq/L (ref 3.7–5.3)
SODIUM: 140 meq/L (ref 137–147)
TOTAL PROTEIN: 7.2 g/dL (ref 6.0–8.3)
Total Bilirubin: 0.3 mg/dL (ref 0.3–1.2)

## 2014-02-17 LAB — CBC
HEMATOCRIT: 41 % (ref 36.0–46.0)
HEMOGLOBIN: 14.1 g/dL (ref 12.0–15.0)
MCH: 32.2 pg (ref 26.0–34.0)
MCHC: 34.4 g/dL (ref 30.0–36.0)
MCV: 93.6 fL (ref 78.0–100.0)
Platelets: 245 10*3/uL (ref 150–400)
RBC: 4.38 MIL/uL (ref 3.87–5.11)
RDW: 12 % (ref 11.5–15.5)
WBC: 6 10*3/uL (ref 4.0–10.5)

## 2014-02-17 LAB — URINALYSIS, ROUTINE W REFLEX MICROSCOPIC
Bilirubin Urine: NEGATIVE
Glucose, UA: NEGATIVE mg/dL
HGB URINE DIPSTICK: NEGATIVE
Ketones, ur: NEGATIVE mg/dL
Leukocytes, UA: NEGATIVE
NITRITE: NEGATIVE
PROTEIN: NEGATIVE mg/dL
Specific Gravity, Urine: 1.002 — ABNORMAL LOW (ref 1.005–1.030)
UROBILINOGEN UA: 0.2 mg/dL (ref 0.0–1.0)
pH: 7 (ref 5.0–8.0)

## 2014-02-17 LAB — TROPONIN I

## 2014-02-17 MED ORDER — LORAZEPAM 0.5 MG PO TABS
0.5000 mg | ORAL_TABLET | Freq: Once | ORAL | Status: AC
  Administered 2014-02-17: 0.5 mg via ORAL
  Filled 2014-02-17: qty 1

## 2014-02-17 MED ORDER — NAPROXEN 500 MG PO TABS: 500.0000 mg | ORAL_TABLET | Freq: Two times a day (BID) | ORAL | Status: DC

## 2014-02-17 NOTE — ED Notes (Signed)
Bed: [id number] Expected date:  Expected time:  Means of arrival:  Comments: EMS - hip pain, palpitations

## 2014-02-17 NOTE — ED Notes (Signed)
Per EMS pt coming from home with c/o right hip pain that radiates to right leg, denies injury.

## 2014-02-17 NOTE — Discharge Instructions (Signed)
1. Medications: naprosyn, usual home medications 2. Treatment: rest, drink plenty of fluids, alternate ice and heat on your back, back stretching as discussed 3. Follow Up: Please followup with your primary doctor for discussion of your diagnoses and further evaluation after today's visit; if you do not have a primary care doctor use the resource guide provided to find one;     Back Exercises Back exercises help treat and prevent back injuries. The goal of back exercises is to increase the strength of your abdominal and back muscles and the flexibility of your back. These exercises should be started when you no longer have back pain. Back exercises include:  Pelvic Tilt. Lie on your back with your knees bent. Tilt your pelvis until the lower part of your back is against the floor. Hold this position 5 to 10 sec and repeat 5 to 10 times.  Knee to Chest. Pull first 1 knee up against your chest and hold for 20 to 30 seconds, repeat this with the other knee, and then both knees. This may be done with the other leg straight or bent, whichever feels better.  Sit-Ups or Curl-Ups. Bend your knees 90 degrees. Start with tilting your pelvis, and do a partial, slow sit-up, lifting your trunk only 30 to 45 degrees off the floor. Take at least 2 to 3 seconds for each sit-up. Do not do sit-ups with your knees out straight. If partial sit-ups are difficult, simply do the above but with only tightening your abdominal muscles and holding it as directed.  Hip-Lift. Lie on your back with your knees flexed 90 degrees. Push down with your feet and shoulders as you raise your hips a couple inches off the floor; hold for 10 seconds, repeat 5 to 10 times.  Back arches. Lie on your stomach, propping yourself up on bent elbows. Slowly press on your hands, causing an arch in your low back. Repeat 3 to 5 times. Any initial stiffness and discomfort should lessen with repetition over time.  Shoulder-Lifts. Lie face down with  arms beside your body. Keep hips and torso pressed to floor as you slowly lift your head and shoulders off the floor. Do not overdo your exercises, especially in the beginning. Exercises may cause you some mild back discomfort which lasts for a few minutes; however, if the pain is more severe, or lasts for more than 15 minutes, do not continue exercises until you see your caregiver. Improvement with exercise therapy for back problems is slow.  See your caregivers for assistance with developing a proper back exercise program. Document Released: 11/22/2004 Document Revised: 01/07/2012 Document Reviewed: 08/16/2011 [facility] Patient Information 2014 [employer] [place].    Emergency Department Resource Guide 1) Find a Doctor and Pay Out of Pocket Although you won't have to find out who is covered by your insurance plan, it is a good idea to ask around and get recommendations. You will then need to call the office and see if the doctor you have chosen will accept you as a new patient and what types of options they offer for patients who are self-pay. Some doctors offer discounts or will set up payment plans for their patients who do not have insurance, but you will need to ask so you aren't surprised when you get to your appointment.  2) Contact Your Local Health Department Not all health departments have doctors that can see patients for sick visits, but many do, so it is worth a call to see if yours does.  If you don't know where your local health department is, you can check in your phone book. The CDC also has a tool to help you locate your state's health department, and many state websites also have listings of all of their local health departments.  3) Find a Walk-in Clinic If your illness is not likely to be very severe or complicated, you may want to try a walk in clinic. These are popping up all over the country in pharmacies, drugstores, and shopping centers. They're usually staffed by nurse  practitioners or physician assistants that have been trained to treat common illnesses and complaints. They're usually fairly quick and inexpensive. However, if you have serious medical issues or chronic medical problems, these are probably not your best option.  No Primary Care Doctor: - Call Health Connect at  [phone] - they can help you locate a primary care doctor that  accepts your insurance, provides certain services, etc. - Physician Referral Service- [phone]  Chronic Pain Problems: Organization         Address  Phone   Notes  [facility]  [phone]) [phone] Patients need to be referred by their primary care doctor.   Medication Assistance: Organization         Address  Phone   Notes  [facility] [address] [place], [place] [address] [phone] --Must be a resident of [place] -- Must have NO insurance coverage whatsoever (no Medicaid/ Medicare, etc.) -- The pt. MUST have a primary care doctor that directs their care regularly and follows them in the community   [facility]  [phone]   [employer]  [phone]    Agencies that provide inexpensive medical care: Organization         Address  Phone   Notes  [facility]  [phone]) [phone]   [facility]    [phone]) [phone]   [facility] [address] [place], [place] [address] [phone]) [phone]   [facility] [address], [place] [phone]   [facility]    [phone]   [facility]    [phone]) [phone]   [facility]  [address], [place] Phone:  [phone](336) [phone], Fax:  [phone] Hours of Operation:  9 am - 6 pm, M-F.  Also accepts Medicaid/Medicare and self-pay.  [facility]  [address], [place] Phone: [phone], Fax: [phone]. Hours of Operation:  8:30 am -  5:30 pm, M-F.  Also accepts Medicaid and self-pay.  [facility] [place] [address], [place] Phone: [phone]) [phone]   [facility] [address], [place], [place] [phone], Ext. [phone] Mondays & Thursdays: 7-9 AM.  First 15 patients are seen on a first come, first serve basis.    Medicaid-accepting [facility] [place] Providers:  Organization         Address  Phone   Notes  [facility] [address], [place] [phone]) [phone] Also accepts self-pay patients.  [facility] [address], [place]  [phone](336) [phone]   [facility] [address], [place] [phone]   [facility] [address] [place] [address], [place] [phone](336) [phone]   Renaye RakersVeita Bland [address], [place]   [phone] Only  accepts [place] [employer] patients after they have their name applied to their card.   Self-Pay (no insurance) in [facility]:  Organization         Address  Phone   Notes  Sickle Cell Patients, [facility] [address] [place], [place] [phone]   [facility] [address] [place], [place] [phone]   [facility] [place]  [address], [place] [phone]   [facility]/Dr. Osei-Bonsu  [address], [place] or [address], [place] [phone] Phone number for both [place] and [place] locations is the same.  [facility] [address] [place] [phone]   [facility] [address], [place] or [address] [place] [address] [phone]   [facility] [address] [place] [phone], phone; [phone], fax Sees patients 1st and 3rd Saturday of every month.  Must not qualify for public or private insurance (i.e. Medicaid, Medicare, [place] Health Choice,  Veterans' Benefits)  Household income should be no more than 200% of the poverty level The clinic cannot treat you if you are pregnant or think you are pregnant  Sexually transmitted diseases are not treated at the clinic.    Dental Care: Organization         Address  Phone  Notes  [facility] [address] [place], [place] [phone] Accepts children up to age 18 who are enrolled in [place] or [place] Health Choice; pregnant women with a Medicaid card; and children who have applied for Medicaid or [place] Health Choice, but were declined, whose parents can pay a reduced fee at time of service.  [facility] Department of [facility]  [address], [place] [phone] Accepts children up to age 8 who are enrolled in [place] or [place] Health Choice; pregnant women with a Medicaid card; and children who have applied for Medicaid or [place] Health Choice, but were declined, whose parents can pay a reduced fee at time of service.  [facility]  [address] [place], [place] [phone] Patients are seen by appointment only. Walk-ins are not accepted. [facility] will see patients 58 years of age and older. Monday - Tuesday (8am-5pm) Most Wednesdays (8:30-5pm) $30 per visit, cash only  [facility]  [address], [facility] [phone] Patients are seen by appointment only. Walk-ins are not accepted. [facility] will see patients 74 years of age and older. One Wednesday Evening (Monthly: Volunteer Based).  $30 per visit, cash only  [employer]  [phone] for adults; Children under age 44, call Graduate Pediatric Dentistry at [phone]. Children aged 26-14, please call [phone] to request a pediatric application.  Dental services are provided in all areas of dental care including fillings, crowns and bridges, complete and  partial dentures, implants, gum treatment, root canals, and extractions. Preventive care is also provided. Treatment is provided to both adults and children. Patients are selected via a lottery and there is often a waiting list.   [facility] [address], [place]  [phone] [email or web address]   [facility] [address] [place], [place] [phone], Ext. [phone] Second and Fourth Thursday of each month, opens at 6:30 AM; Clinic ends  AM.  Patients are seen on a first-come first-served basis, and a limited number are seen during each clinic.  ° °[facility] ° [address], [place], [place] [phone]   Eligibility Requirements °You must have lived in [place], [place], or [place] counties for at least the last three months. °  You cannot be eligible for state or federal sponsored healthcare insurance, including Veterans Administration, Medicaid, or Medicare. °  You generally cannot be eligible for healthcare insurance through your employer.  °  How to apply: °Eligibility screenings are held every Tuesday and Wednesday afternoon from 1:00 pm until 4:00 pm. You do not need an appointment for the interview!  °[facility] [address], [place], [place] [phone]   °[facility]  [phone]   °[facility]  [phone]   °[facility]  336-570-6415   ° °Behavioral Health Resources in the Community: °Intensive Outpatient Programs °Organization         Address  Phone  Notes  °[place] [facility] [address], [place], [place] [phone]   °[facility] [address], [place], [place] [phone]   °[facility] [address], [place], [place] ° 336-882-2125   °[facility] [address],  °[place], [place] 1-800-853-5163 or 336-641-4981   °Substance Abuse Resources °Organization          Address  Phone  Notes  °[facility]  336-882-2125   °[facility]  336-784-9470   °[facility]  336-285-9073   °[facility]  336-845-3988   °Residential & Outpatient Substance Abuse Program  1-800-659-3381   °Psychological Services °Organization         Address  Phone  Notes  °[facility]  336- [phone]   °[facility]  336- [phone]   °[facility] [address], [place] 1-800-853-5163 or 336-641-4981   ° °Mobile Crisis Teams °Organization         Address  Phone  Notes  °[facility], Mobile Crisis Care Unit  1-877-626-1772   °Assertive °Psychotherapeutic Services ° [address] [place], [place] 336-834-9664   °Sharon DeEsch [address] °[place] 336-554-5454   ° °Self-Help/Support Groups °Organization         Address  Phone             Notes  °[facility] [place] - variety of support groups  336- 373-1402 Call for more information  °Narcotics Anonymous (NA), [facility] [address], °[place]  2 meetings at this location  ° °Residential Treatment Programs °Organization         Address  Phone  Notes  °[facility] [address],    °[place]  1-866-801-8205   °[facility] ° [address], [place], [place] [phone]   °[facility] [address], [place] 336-845-3988 Admissions: 8am-3pm M-F  °[facility] [address]    °[place], [place] 336-841-1104   °[facility] [address], [place], [place] 336-379-7146   °[facility] [address]  °[place], [place] 336-285-9073   °[facility] - Intensive Outpatient [address], ,  336-852-3033   °[facility] ([facility] [address]  °[place],  1-877-615-2722 or 336-784-9470   °[facility] ([facility])   Services ([facility]) [address], [place], [place]  [phone] Accepts Medicaid  [facility] [address]  [place] [phone] Substance Abuse/Addiction Treatment   [facility] Organization         Address  Phone  Notes  [facility]  [phone]   Angie Fava, PhD [address] [place], [place]   [phone] or [phone]   [facility]   [address] [place], [place] [phone]   [facility] [address], [place], [place] [phone] Insurance/Medicaid/sponsorship through [facility] [address]                                    [place], [place] [phone] Therapy/tele-psych/case  [facility] [address], [place] [phone]    Dr. Lolly Mustache  [phone]   [facility] [place]  [employer] [facility] Dept. 1) [address], [place] 2) [address] [place] 3)  [address], [place] [phone]  [phone]   [facility] Child Abuse Hotline (479)[phone] or [phone] (After Hours)

## 2014-02-17 NOTE — ED Provider Notes (Signed)
CSN: [id number]     Arrival date & time 02/17/14  1444 History   First MD Initiated Contact with Patient 02/17/14 1515     Chief Complaint  Patient presents with  . Hip Pain     (Consider location/radiation/quality/duration/timing/severity/associated sxs/prior Treatment) Patient is a 58 y.o. female presenting with hip pain. The history is provided by the patient and medical records. No language interpreter was used.  Hip Pain Associated symptoms include arthralgias (right hip). Pertinent negatives include no abdominal pain, chest pain, chills, coughing, diaphoresis, fatigue, fever, headaches, joint swelling, nausea, neck pain, numbness, rash or vomiting.    Courtney Blackburn is a 58 y.o. female  with a hx of depression and anxiety (currently off medications but used to take Xanax), HTN, GERD presents to the Emergency Department complaining of gradual, persistent, progressively worsening lightheadedness with associated fatigue onset after awakening from sleep this AM. She reports that the room is not spinning.  Associated symptoms include palpitations, SOB, nausea without chest pain.  Symptoms lasting for approx 5 hours.  Pt tried zofran which relieved the nausea, but no other symptoms.  Pt was able to shower and ambulate without difficulty today.  No aggravating or alleviating factors.  She reports that symptoms are the same as all of her previous panic attacks, but the palpitations seemed more prominent this time. Pt denies fever, chills, headache, neck pain, chest pain, abd pain, V/D, weakness, syncope, dysuria, hematuria.  Pt has no PCP.    Pt reports intermittent right hip pain for the last 3 weeks that is located on the lateral hip which radiates into her right groin.  She has been taking tylenol with only minor relief.  She reports no aggravating factors for her hip pain.  NO falls, trauma or known injury.  Pt without hx of back or hip problems.  She reports that she has intermittent right  sided, low back pain.  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  Pain is described as an ache and rated at a 4/10.  She reports neither the back pain nor the hip pain are bothering her today, but her daughter thought she should mention it.     Past Medical History  Diagnosis Date  . Hiatal hernia   . Anxiety   . Depression   . Hypertension   . Acid reflux    Past Surgical History  Procedure Laterality Date  . Tubal ligation    . Appendectomy    . Wisdom tooth extraction     No family history on file. History  Substance Use Topics  . Smoking status: Current Every Day Smoker -- 1.00 packs/day    Types: Cigarettes  . Smokeless tobacco: Not on file  . Alcohol Use: No   OB History   Grav Para Term Preterm Abortions TAB SAB Ect Mult Living   1         1     Review of Systems  Constitutional: Negative for fever, chills, diaphoresis, appetite change, fatigue and unexpected weight change.  HENT: Negative for mouth sores.   Eyes: Negative for visual disturbance.  Respiratory: Negative for cough, chest tightness, shortness of breath and wheezing.   Cardiovascular: Negative for chest pain.  Gastrointestinal: Negative for nausea, vomiting, abdominal pain, diarrhea and constipation.  Endocrine: Negative for polydipsia, polyphagia and polyuria.  Genitourinary: Negative for dysuria, urgency, frequency and hematuria.  Musculoskeletal: Positive for arthralgias (  right hip). Negative for back pain, joint swelling, neck pain and neck stiffness.  Skin: Negative for rash and wound.  Allergic/Immunologic: Negative for immunocompromised state.  Neurological: Negative for syncope, light-headedness, numbness and headaches.  Hematological: Does not bruise/bleed easily.  Psychiatric/Behavioral: Negative for sleep disturbance. The patient is not nervous/anxious.   All other systems reviewed and are negative.     Allergies   Codeine  Home Medications   Prior to Admission medications   Medication Sig Start Date End Date Taking? Authorizing Provider  omeprazole (PRILOSEC) 20 MG capsule Take 20 mg by mouth 2 (two) times daily before a meal.    Yes Historical Provider, MD   BP 126/67  Pulse 78  Temp(Src) 98.1 F (36.7 C) (Oral)  Resp 18  SpO2 96%  LMP 10/02/2012 Physical Exam  Nursing note and vitals reviewed. Constitutional: She is oriented to person, place, and time. She appears well-developed and well-nourished. No distress.  HENT:  Head: Normocephalic and atraumatic.  Right Ear: Tympanic membrane, external ear and ear canal normal.  Left Ear: Tympanic membrane, external ear and ear canal normal.  Nose: Nose normal. No epistaxis. Right sinus exhibits no maxillary sinus tenderness and no frontal sinus tenderness. Left sinus exhibits no maxillary sinus tenderness and no frontal sinus tenderness.  Mouth/Throat: Uvula is midline, oropharynx is clear and moist and mucous membranes are normal. Mucous membranes are not pale and not cyanotic. No oropharyngeal exudate, posterior oropharyngeal edema, posterior oropharyngeal erythema or tonsillar abscesses.  Eyes: Conjunctivae and EOM are normal. Pupils are equal, round, and reactive to light.  No horizontal, rotational or vertical nystagmus  Neck: Normal range of motion and full passive range of motion without pain. Neck supple. No spinous process tenderness and no muscular tenderness present. No rigidity. Normal range of motion present.  Full ROM without pain No midline or paraspinal tenderness  Cardiovascular: Normal rate, regular rhythm, normal heart sounds and intact distal pulses.   No murmur heard. Pulmonary/Chest: Effort normal and breath sounds normal. No stridor. No respiratory distress. She has no wheezes.  Abdominal: Soft. Bowel sounds are normal. She exhibits no distension. There is no tenderness.  Musculoskeletal: Normal range of motion.       Right  hip: She exhibits tenderness (rigth buttock). She exhibits normal range of motion, normal strength, no bony tenderness, no swelling, no crepitus, no deformity and no laceration.       Left hip: Normal. She exhibits normal range of motion, normal strength, no tenderness, no bony tenderness, no swelling, no crepitus, no deformity and no laceration.       Lumbar back: Normal. She exhibits normal range of motion, no tenderness, no bony tenderness, no swelling, no edema, no deformity, no laceration, no pain and no spasm.  Full range of motion of the T-spine and L-spine No tenderness to palpation of the spinous processes of the T-spine or L-spine No tenderness to palpation of the paraspinous muscles of the L-spine Mild tenderness to the right buttock with palpation; no pain to palpation over the right greater trochanter Negative straight leg raise bilaterally, but full flexion of the right hip illicits pain in the right buttock.    Lymphadenopathy:    She has no cervical adenopathy.  Neurological: She is alert and oriented to person, place, and time. She has normal reflexes. No cranial nerve deficit. She exhibits normal muscle tone. Coordination normal.  Mental Status:  Alert, oriented, thought content appropriate. Speech fluent without evidence of aphasia. Able to follow 2 step  commands without difficulty.  Cranial Nerves:  II:  Peripheral visual fields grossly normal, pupils equal, round, reactive to light III,IV, VI: ptosis not present, extra-ocular motions intact bilaterally  V,VII: smile symmetric, facial light touch sensation equal VIII: hearing grossly normal bilaterally  IX,X: gag reflex present  XI: bilateral shoulder shrug equal and strong XII: midline tongue extension  Motor:  5/5 in upper and lower extremities bilaterally including strong and equal grip strength and dorsiflexion/plantar flexion Sensory: Pinprick and light touch normal in all extremities.  Deep Tendon Reflexes: 2+ and  symmetric  Cerebellar: normal finger-to-nose with bilateral upper extremities Gait: normal gait and balance CV: distal pulses palpable throughout   Skin: Skin is warm and dry. No rash noted. She is not diaphoretic. No erythema.  Psychiatric: She has a normal mood and affect. Her behavior is normal.    ED Course  Procedures (including critical care time) Labs Review Labs Reviewed  URINALYSIS, ROUTINE W REFLEX MICROSCOPIC - Abnormal; Notable for the following:    Specific Gravity, Urine 1.002 (*)    All other components within normal limits  CBC  COMPREHENSIVE METABOLIC PANEL  TROPONIN I    Imaging Review Dg Chest 2 View  02/17/2014   CLINICAL DATA:  Shortness of breath, palpitations, history smoking, hypertension  EXAM: CHEST  2 VIEW  COMPARISON:  11/19/2013  FINDINGS: Normal heart size, mediastinal contours, and pulmonary vascularity.  Minimal chronic peribronchial thickening.  No acute infiltrate, pleural effusion or pneumothorax.  Bones appear demineralized.  IMPRESSION: No acute abnormalities.   Electronically Signed   By: Ulyses Southward M.D.   On: 02/17/2014 16:58   Dg Hip Complete Right  02/17/2014   CLINICAL DATA:  Right hip pain for 4 weeks.  No known injury  EXAM: RIGHT HIP - COMPLETE 2+ VIEW  COMPARISON:  None.  FINDINGS: Three views of the right hip submitted. No acute fracture or subluxation. Mild degenerative changes with minimal superior acetabular spurring bilaterally. Mild spurring of right greater femoral trochanter.  IMPRESSION: No acute fracture or subluxation.  Minimal degenerative changes.   Electronically Signed   By: Natasha Mead M.D.   On: 02/17/2014 16:57     EKG Interpretation   Date/Time:  Wednesday February 17 2014 16:07:34 EDT Ventricular Rate:  78 PR Interval:  133 QRS Duration: 76 QT Interval:  395 QTC Calculation: 450 R Axis:   70 Text Interpretation:  Sinus rhythm Atrial premature complex Baseline  wander in inferior leads No old tracing to compare  Reconfirmed by Juleen China   MD, STEPHEN [id number]) on 02/17/2014 6:09:36 PM      MDM   Final diagnoses:  Anxiety  Sciatica of right side    Courtney Blackburn presents with several complaints today. First is palpitations, with mild shortness of breath and nausea. Patient reports the symptoms are consistent with her usual anxiety attacks. She was concerned today because the palpation seems slightly stronger resolved prior to arrival.  She also complains of intermittent right hip pain.  Patient reports that neither her back pain nor R. hip pain are bothering her today.  Patient exam consistent with likely sciatica.  Normal neurological exam, no evidence of urinary incontinence or retention, pain is consistently reproducible. There is no evidence of AAA or concern for dissection at this time.   Patient ambulates without pain, difficulty or gait disturbance.   No loss of bowel or bladder control.  No concern for cauda equina.  No fever, night sweats, weight loss, h/o cancer, IVDU.  Will given ativan, check labs and re-evaluate.    6:02 PM Pt reports she is feeling much better and all her symptoms have resolved. She continues to ambulate without difficulty.  Pt's symptoms likely attributed to anxiety.  Pt's ECG with baseline wander but not acute ischemia.  Pt cardiac exam with RRR and no murmurs.  NO tachycardia noted in the ED.  Doubt cardiac etiology of patient' symptoms.  Pt without chest pain, vomiting or diaphoresis.    Symptoms are not likely of cardiac or pulmonary etiology d/t presentation, perc negative, VSS, no tracheal deviation, no JVD or new murmur, RRR, breath sounds equal bilaterally, EKG without acute abnormalities, negative troponin, and negative CXR. Pt has been advised to return to the ED if she develops CP that is exertional, associated with diaphoresis or nausea, radiates to left jaw/arm.  Pt reports she does not currently have a PCP, but that she will find one this week and establish care.     It has been determined that no acute conditions requiring further emergency intervention are present at this time. The patient/guardian have been advised of the diagnosis and plan. We have discussed signs and symptoms that warrant return to the ED, such as changes or worsening in symptoms including .   Vital signs are stable at discharge.   BP 126/67  Pulse 78  Temp(Src) 98.1 F (36.7 C) (Oral)  Resp 18  SpO2 96%  LMP 10/02/2012  Patient/guardian has voiced understanding and agreed to follow-up with the PCP or specialist.      Dierdre ForthHannah Marylan Glore, PA-C 02/17/14 1812

## 2014-02-17 NOTE — ED Provider Notes (Signed)
Medical screening examination/treatment/procedure(s) were performed by non-physician practitioner and as supervising physician I was immediately available for consultation/collaboration.   EKG Interpretation   Date/Time:  Wednesday February 17 2014 16:07:34 EDT Ventricular Rate:  78 PR Interval:  133 QRS Duration: 76 QT Interval:  395 QTC Calculation: 450 R Axis:   70 Text Interpretation:  Sinus rhythm Atrial premature complex Baseline  wander in inferior leads No old tracing to compare Reconfirmed by Juleen ChinaKOHUT   MD, Maleko Greulich [phone]([id number]) on 02/17/2014 6:09:36 PM       Raeford RazorStephen Krystle Oberman, MD 02/17/14 2326

## 2014-04-14 ENCOUNTER — Encounter (HOSPITAL_COMMUNITY): Payer: Self-pay | Admitting: Emergency Medicine

## 2014-04-14 ENCOUNTER — Emergency Department (HOSPITAL_COMMUNITY)
Admission: EM | Admit: 2014-04-14 | Discharge: 2014-04-14 | Disposition: A | Discharge status: Home / Self Care | Payer: BC Managed Care – PPO | Attending: Emergency Medicine | Admitting: Emergency Medicine

## 2014-04-14 DIAGNOSIS — F32A Depression, unspecified: Secondary | ICD-10-CM

## 2014-04-14 DIAGNOSIS — Z791 Long term (current) use of non-steroidal anti-inflammatories (NSAID): Secondary | ICD-10-CM | POA: Insufficient documentation

## 2014-04-14 DIAGNOSIS — K219 Gastro-esophageal reflux disease without esophagitis: Secondary | ICD-10-CM | POA: Insufficient documentation

## 2014-04-14 DIAGNOSIS — F17200 Nicotine dependence, unspecified, uncomplicated: Secondary | ICD-10-CM | POA: Insufficient documentation

## 2014-04-14 DIAGNOSIS — Z79899 Other long term (current) drug therapy: Secondary | ICD-10-CM | POA: Insufficient documentation

## 2014-04-14 DIAGNOSIS — F329 Major depressive disorder, single episode, unspecified: Secondary | ICD-10-CM

## 2014-04-14 DIAGNOSIS — F419 Anxiety disorder, unspecified: Secondary | ICD-10-CM

## 2014-04-14 DIAGNOSIS — F411 Generalized anxiety disorder: Secondary | ICD-10-CM | POA: Insufficient documentation

## 2014-04-14 DIAGNOSIS — I10 Essential (primary) hypertension: Secondary | ICD-10-CM | POA: Insufficient documentation

## 2014-04-14 DIAGNOSIS — F3289 Other specified depressive episodes: Secondary | ICD-10-CM | POA: Insufficient documentation

## 2014-04-14 DIAGNOSIS — R197 Diarrhea, unspecified: Secondary | ICD-10-CM | POA: Insufficient documentation

## 2014-04-14 LAB — COMPREHENSIVE METABOLIC PANEL
ALBUMIN: 4.6 g/dL (ref 3.5–5.2)
ALK PHOS: 100 U/L (ref 39–117)
ALT: 8 U/L (ref 0–35)
AST: 13 U/L (ref 0–37)
BILIRUBIN TOTAL: 0.6 mg/dL (ref 0.3–1.2)
BUN: 9 mg/dL (ref 6–23)
CHLORIDE: 100 meq/L (ref 96–112)
CO2: 24 mEq/L (ref 19–32)
CREATININE: 0.69 mg/dL (ref 0.50–1.10)
Calcium: 10.2 mg/dL (ref 8.4–10.5)
GFR calc Af Amer: >90 mL/min (ref >90)
GFR calc non Af Amer: >90 mL/min (ref >90)
Glucose, Bld: 138 mg/dL — ABNORMAL HIGH (ref 70–99)
POTASSIUM: 3.8 meq/L (ref 3.7–5.3)
Sodium: 141 mEq/L (ref 137–147)
TOTAL PROTEIN: 8 g/dL (ref 6.0–8.3)

## 2014-04-14 LAB — CBC WITH DIFFERENTIAL/PLATELET
BASOS ABS: 0 10*3/uL (ref 0.0–0.1)
BASOS PCT: 0 % (ref 0–1)
Eosinophils Absolute: 0.1 10*3/uL (ref 0.0–0.7)
Eosinophils Relative: 1 % (ref 0–5)
HCT: 45.8 % (ref 36.0–46.0)
Hemoglobin: 15.6 g/dL — ABNORMAL HIGH (ref 12.0–15.0)
Lymphocytes Relative: 28 % (ref 12–46)
Lymphs Abs: 2.3 10*3/uL (ref 0.7–4.0)
MCH: 32.3 pg (ref 26.0–34.0)
MCHC: 34.1 g/dL (ref 30.0–36.0)
MCV: 94.8 fL (ref 78.0–100.0)
Monocytes Absolute: 0.4 10*3/uL (ref 0.1–1.0)
Monocytes Relative: 5 % (ref 3–12)
NEUTROS ABS: 5.5 10*3/uL (ref 1.7–7.7)
NEUTROS PCT: 66 % (ref 43–77)
Platelets: 247 10*3/uL (ref 150–400)
RBC: 4.83 MIL/uL (ref 3.87–5.11)
RDW: 13.4 % (ref 11.5–15.5)
WBC: 8.2 10*3/uL (ref 4.0–10.5)

## 2014-04-14 MED ORDER — LORAZEPAM 1 MG PO TABS
1.0000 mg | ORAL_TABLET | Freq: Once | ORAL | Status: AC
  Administered 2014-04-14: 1 mg via ORAL
  Filled 2014-04-14: qty 1

## 2014-04-14 MED ORDER — LORAZEPAM 1 MG PO TABS: 1.0000 mg | ORAL_TABLET | Freq: Three times a day (TID) | ORAL | Status: DC | PRN
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
## 2014-04-14 NOTE — ED Notes (Signed)
Pt reports having constant panic attacks and anxiety which is causing diarrhea. Denies any SI or HI.

## 2014-04-14 NOTE — Discharge Instructions (Signed)
Follow up with a therapist or Psychiatrist as soon as possible.    Panic Attacks Panic attacks are sudden, short-livedsurges of severe anxiety, fear, or discomfort. They may occur for no reason when you are relaxed, when you are anxious, or when you are sleeping. Panic attacks may occur for a number of reasons:   Healthy people occasionally have panic attacks in extreme, life-threatening situations, such as war or natural disasters. Normal anxiety is a protective mechanism of the body that helps us react to danger (fight or flight response).  Panic attacks are often seen with anxiety disorders, such as panic disorder, social anxiety disorder, generalized anxiety disorder, and phobias. Anxiety disorders cause excessive or uncontrollable anxiety. They may interfere with your relationships or other life activities.  Panic attacks are sometimes seen with other mental illnesses, such as depression and posttraumatic stress disorder.  Certain medical conditions, prescription medicines, and drugs of abuse can cause panic attacks. SYMPTOMS  Panic attacks start suddenly, peak within 20 minutes, and are accompanied by four or more of the following symptoms:  Pounding heart or fast heart rate (palpitations).  Sweating.  Trembling or shaking.  Shortness of breath or feeling smothered.  Feeling choked.  Chest pain or discomfort.  Nausea or strange feeling in your stomach.  Dizziness, light-headedness, or feeling like you will faint.  Chills or hot flushes.  Numbness or tingling in your lips or hands and feet.  Feeling that things are not real or feeling that you are not yourself.  Fear of losing control or going crazy.  Fear of dying. Some of these symptoms can mimic serious medical conditions. For example, you may think you are having a heart attack. Although panic attacks can be very scary, they are not life threatening. DIAGNOSIS  Panic attacks are diagnosed through an assessment by  your health care provider. Your health care provider will ask questions about your symptoms, such as where and when they occurred. Your health care provider will also ask about your medical history and use of alcohol and drugs, including prescription medicines. Your health care provider may order blood tests or other studies to rule out a serious medical condition. Your health care provider may refer you to a mental health professional for further evaluation. TREATMENT   Most healthy people who have one or two panic attacks in an extreme, life-threatening situation will not require treatment.  The treatment for panic attacks associated with anxiety disorders or other mental illness typically involves counseling with a mental health professional, medicine, or a combination of both. Your health care provider will help determine what treatment is best for you.  Panic attacks due to physical illness usually go away with treatment of the illness. If prescription medicine is causing panic attacks, talk with your health care provider about stopping the medicine, decreasing the dose, or substituting another medicine.  Panic attacks due to alcohol or drug abuse go away with abstinence. Some adults need professional help in order to stop drinking or using drugs. HOME CARE INSTRUCTIONS   Take all medicines as directed by your health care provider.   Schedule and attend follow-up visits as directed by your health care provider. It is important to keep all your appointments. SEEK MEDICAL CARE IF:  You are not able to take your medicines as prescribed.  Your symptoms do not improve or get worse. SEEK IMMEDIATE MEDICAL CARE IF:   You experience panic attack symptoms that are different than your usual symptoms.  You have serious thoughts  about hurting yourself or others.  You are taking medicine for panic attacks and have a serious side effect. MAKE SURE YOU:  Understand these instructions.  Will  watch your condition.  Will get help right away if you are not doing well or get worse. Document Released: 10/15/2005 Document Revised: 10/20/2013 Document Reviewed: 05/29/2013 [facility] Patient Information 2015 [place], [place]. This information is not intended to replace advice given to you by your health care provider. Make sure you discuss any questions you have with your health care provider.  Depression, Adult Depression is feeling sad, low, down in the dumps, blue, gloomy, or empty. In general, there are two kinds of depression:  Normal sadness or grief. This can happen after something upsetting. It often goes away on its own within 2 weeks. After losing a loved one (bereavement), normal sadness and grief may last longer than two weeks. It usually gets better with time.  Clinical depression. This kind lasts longer than normal sadness or grief. It keeps you from doing the things you normally do in life. It is often hard to function at home, work, or at school. It may affect your relationships with others. Treatment is often needed. GET HELP RIGHT AWAY IF:  You have thoughts about hurting yourself or others.  You lose touch with reality (psychotic symptoms). You may:  See or hear things that are not real.  Have untrue beliefs about your life or people around you.  Your medicine is giving you problems. MAKE SURE YOU:  Understand these instructions.  Will watch your condition.  Will get help right away if you are not doing well or get worse. Document Released: 11/17/2010 Document Revised: 07/09/2012 Document Reviewed: 02/14/2012 [facility] Patient Information 2014 [place], [place].

## 2014-08-30 ENCOUNTER — Encounter (HOSPITAL_COMMUNITY): Payer: Self-pay | Admitting: Emergency Medicine

## 2014-09-12 ENCOUNTER — Encounter (HOSPITAL_COMMUNITY): Payer: Self-pay

## 2014-09-12 ENCOUNTER — Emergency Department (HOSPITAL_COMMUNITY)
Admission: EM | Admit: 2014-09-12 | Discharge: 2014-09-12 | Disposition: A | Discharge status: Home / Self Care | Payer: BC Managed Care – PPO | Attending: Emergency Medicine | Admitting: Emergency Medicine

## 2014-09-12 ENCOUNTER — Emergency Department (HOSPITAL_COMMUNITY): Payer: BC Managed Care – PPO

## 2014-09-12 DIAGNOSIS — F329 Major depressive disorder, single episode, unspecified: Secondary | ICD-10-CM | POA: Insufficient documentation

## 2014-09-12 DIAGNOSIS — Z79899 Other long term (current) drug therapy: Secondary | ICD-10-CM | POA: Insufficient documentation

## 2014-09-12 DIAGNOSIS — J159 Unspecified bacterial pneumonia: Secondary | ICD-10-CM | POA: Insufficient documentation

## 2014-09-12 DIAGNOSIS — J189 Pneumonia, unspecified organism: Secondary | ICD-10-CM

## 2014-09-12 DIAGNOSIS — K219 Gastro-esophageal reflux disease without esophagitis: Secondary | ICD-10-CM | POA: Insufficient documentation

## 2014-09-12 DIAGNOSIS — F419 Anxiety disorder, unspecified: Secondary | ICD-10-CM | POA: Insufficient documentation

## 2014-09-12 DIAGNOSIS — R059 Cough, unspecified: Secondary | ICD-10-CM

## 2014-09-12 DIAGNOSIS — Z791 Long term (current) use of non-steroidal anti-inflammatories (NSAID): Secondary | ICD-10-CM | POA: Insufficient documentation

## 2014-09-12 DIAGNOSIS — I10 Essential (primary) hypertension: Secondary | ICD-10-CM | POA: Insufficient documentation

## 2014-09-12 DIAGNOSIS — H9209 Otalgia, unspecified ear: Secondary | ICD-10-CM | POA: Insufficient documentation

## 2014-09-12 DIAGNOSIS — M542 Cervicalgia: Secondary | ICD-10-CM | POA: Insufficient documentation

## 2014-09-12 DIAGNOSIS — R05 Cough: Secondary | ICD-10-CM

## 2014-09-12 DIAGNOSIS — Z720 Tobacco use: Secondary | ICD-10-CM | POA: Insufficient documentation

## 2014-09-12 MED ORDER — AZITHROMYCIN 250 MG PO TABS: 250.0000 mg | ORAL_TABLET | Freq: Every day | ORAL | Status: DC

## 2014-09-12 MED ORDER — GUAIFENESIN 100 MG/5ML PO LIQD: 100.0000 mg | ORAL | Status: DC | PRN

## 2014-09-12 NOTE — ED Provider Notes (Signed)
CSN: [id number]     Arrival date & time 09/12/14  1531 History  This chart was scribed for Courtney Piedraourtney Forcucci, PA-C, working with Courtney CamelScott T Goldston, MD by Chestine SporeSoijett Blackburn, ED Scribe. The patient was seen in room WTR9/WTR9 at 3:55 PM.    Chief Complaint  Patient presents with  . Fever     The history is provided by the patient. No language interpreter was used.   HPI Comments: Courtney Blackburn is a 58 y.o. female with a medical hx of HTN and Acid Reflux, who presents to the Emergency Department complaining of fever onset 2 days ago. She states that on Friday her fever was at a max of 105.7. She states that her daughter had to wake her up because she wasn't waking up. She states that she was very disoriented upon waking up and she was not able to get up. Pt reports that she went to sleep after having her temperature being taken. She reports that her daughter kept waking her up and giving her medicines (tylenol, ASA, and Advil). She reports that she woke up 8 AM Saturday and felt horrible. She reports having a fever of 102. She reports that this morning when she woke up her fever was 101. She states that her HA is one sided on the right side.   She states that she is having associated symptoms of dry cough, HA, sore throat, eye pain, ear pain, abdominal pain, nausea, neck stiffness, neck pain, general body aches. She states that the tylenol and Ibuprofen helps with her HA. She states that her last dose of medicine was either Tylenol or ASA and it was 2 hours ago. She rates her HA currently as a 3/10. She denies diarrhea, vomiting, and any other symptoms. She denies sick contacts. She denies being allergic to anything.  Past Medical History  Diagnosis Date  . Hiatal hernia   . Anxiety   . Depression   . Hypertension   . Acid reflux    Past Surgical History  Procedure Laterality Date  . Tubal ligation    . Appendectomy    . Wisdom tooth extraction     No family history on file. History   Substance Use Topics  . Smoking status: Current Every Day Smoker -- 1.00 packs/day    Types: Cigarettes  . Smokeless tobacco: Not on file  . Alcohol Use: No   OB History    Gravida Para Term Preterm AB TAB SAB Ectopic Multiple Living   1         1     Review of Systems  Constitutional: Positive for fever and fatigue.  HENT: Positive for ear pain and sore throat.   Eyes: Positive for pain.  Respiratory: Positive for cough.   Gastrointestinal: Positive for nausea and abdominal pain. Negative for vomiting and diarrhea.  Musculoskeletal: Positive for neck pain and neck stiffness.  Neurological: Positive for headaches.  All other systems reviewed and are negative.   Allergies  Codeine  Home Medications   Prior to Admission medications   Medication Sig Start Date End Date Taking? Authorizing Blackburn  acetaminophen (TYLENOL) 500 MG tablet Take 500 mg by mouth every 4 (four) hours as needed for fever.   Yes Courtney Provider, MD  DULoxetine (CYMBALTA) 60 MG capsule Take 60 mg by mouth daily.   Yes Courtney Provider, MD  ibuprofen (ADVIL,MOTRIN) 200 MG tablet Take 800 mg by mouth every 6 (six) hours as needed for fever.   Yes Courtney Provider,  MD  omeprazole (PRILOSEC) 20 MG capsule Take 20 mg by mouth 2 (two) times daily before a meal.    Yes Courtney Provider, MD  azithromycin (ZITHROMAX) 250 MG tablet Take 1 tablet (250 mg total) by mouth daily. Take first 2 tablets together, then 1 every day until finished. 09/12/14   Courtney Paone A Forcucci, PA-C  guaiFENesin (ROBITUSSIN) 100 MG/5ML liquid Take 5-10 mLs (100-200 mg total) by mouth every 4 (four) hours as needed for cough. 09/12/14   Courtney Thammavong A Forcucci, PA-C  LORazepam (ATIVAN) 1 MG tablet Take 1 tablet (1 mg total) by mouth 3 (three) times daily as needed for anxiety. Patient not taking: Reported on 09/12/2014 04/14/14   Courtney Melter, MD  naproxen (NAPROSYN) 500 MG tablet Take 1 tablet (500 mg total) by mouth 2 (two) times  daily with a meal. Patient not taking: Reported on 09/12/2014 02/17/14   Dahlia Client Muthersbaugh, PA-C   BP 152/67 mmHg  Pulse 99  Temp(Src) 98.2 F (36.8 C) (Oral)  Resp 16  SpO2 95%  LMP 10/02/2012  Physical Exam  Constitutional: She is oriented to person, place, and time. She appears well-developed and well-nourished. No distress.  HENT:  Head: Normocephalic and atraumatic.  Nose: Nose normal.  Mouth/Throat: Posterior oropharyngeal erythema (mild) present. No oropharyngeal exudate.  Eyes: EOM are normal. Pupils are equal, round, and reactive to light.  Neck: Neck supple. No JVD present. No tracheal deviation present. No Brudzinski's sign and no Kernig's sign noted. No thyromegaly present.  Cardiovascular: Normal rate, regular rhythm and normal heart sounds.  Exam reveals no gallop and no friction rub.   No murmur heard. Pulmonary/Chest: Effort normal and breath sounds normal. No respiratory distress. She has no wheezes. She has no rales. She exhibits no tenderness.  No rhonchi  Musculoskeletal: Normal range of motion.  Lymphadenopathy:    She has no cervical adenopathy.  Neurological: She is alert and oriented to person, place, and time. She has normal strength. No cranial nerve deficit or sensory deficit.  Skin: Skin is warm and dry.  Psychiatric: She has a normal mood and affect. Her behavior is normal. Judgment and thought content normal.  Nursing note and vitals reviewed.   ED Course  Procedures (including critical care time) DIAGNOSTIC STUDIES: Oxygen Saturation is 95% on room air, adequate by my interpretation.    COORDINATION OF CARE: 4:06 PM-Discussed treatment plan which includes CXR with pt at bedside and pt agreed to plan.   Labs Review Labs Reviewed - No data to display  Imaging Review Dg Chest 2 View  09/12/2014   CLINICAL DATA:  58 year old female with shortness of breath for 1 week and Three day history of fever  EXAM: CHEST  2 VIEW  COMPARISON:  Prior chest  x-ray 02/17/2014  FINDINGS: Patchy airspace disease throughout the left lower lobe concerning for bronchopneumonia. The cardiac and mediastinal contours are within normal limits. The right lung remains clear. No pleural effusion, pneumothorax or evidence of pulmonary edema. No acute osseous abnormality.  IMPRESSION: 1. Left lower lobe bronchopneumonia. Recommend imaging followup to resolution to exclude underlying pulmonary nodule.   Electronically Signed   By: Malachy Moan M.D.   On: 09/12/2014 17:00     EKG Interpretation None      MDM   Final diagnoses:  Cough  CAP (community acquired pneumonia)   Patient is a 58 y.o. Female who presents to the ED with cough, fever, and headache.  Physical exam reveals non-toxic appearing female with no apparent  distress.  Lungs are clear to auscultation.  Given reported high fevers and disorientation have performed CXR which reveals left lower lobe pneumonia.  Patient has not been admitted in the last 3 months.  Patient to be discharged home on azithromycin, robitussin, and tylenol or ibuprofen as needed for fevers.  Patient is stable for discharge.  She is to return for worsening shortness of breath, confusion or any other concerning symptoms.  Patient states understanding and agreement.    I personally performed the services described in this documentation, which was scribed in my presence. The recorded information has been reviewed and is accurate.    Eben Burowourtney A Forcucci, PA-C 09/12/14 1720  Courtney CamelScott T Goldston, MD 09/15/14 [phone]

## 2014-09-12 NOTE — Discharge Instructions (Signed)
Pneumonia °Pneumonia is an infection of the lungs.  °CAUSES °Pneumonia may be caused by bacteria or a virus. Usually, these infections are caused by breathing infectious particles into the lungs (respiratory tract). °SIGNS AND SYMPTOMS  °· Cough. °· Fever. °· Chest pain. °· Increased rate of breathing. °· Wheezing. °· Mucus production. °DIAGNOSIS  °If you have the common symptoms of pneumonia, your health care provider will typically confirm the diagnosis with a chest X-ray. The X-ray will show an abnormality in the lung (pulmonary infiltrate) if you have pneumonia. Other tests of your blood, urine, or sputum may be done to find the specific cause of your pneumonia. Your health care provider may also do tests (blood gases or pulse oximetry) to see how well your lungs are working. °TREATMENT  °Some forms of pneumonia may be spread to other people when you cough or sneeze. You may be asked to wear a mask before and during your exam. Pneumonia that is caused by bacteria is treated with antibiotic medicine. Pneumonia that is caused by the influenza virus may be treated with an antiviral medicine. Most other viral infections must run their course. These infections will not respond to antibiotics.  °HOME CARE INSTRUCTIONS  °· Cough suppressants may be used if you are losing too much rest. However, coughing protects you by clearing your lungs. You should avoid using cough suppressants if you can. °· Your health care provider may have prescribed medicine if he or she thinks your pneumonia is caused by bacteria or influenza. Finish your medicine even if you start to feel better. °· Your health care provider may also prescribe an expectorant. This loosens the mucus to be coughed up. °· Take medicines only as directed by your health care provider. °· Do not smoke. Smoking is a common cause of bronchitis and can contribute to pneumonia. If you are a smoker and continue to smoke, your cough may last several weeks after your  pneumonia has cleared. °· A cold steam vaporizer or humidifier in your room or home may help loosen mucus. °· Coughing is often worse at night. Sleeping in a semi-upright position in a recliner or using a couple pillows under your head will help with this. °· Get rest as you feel it is needed. Your body will usually let you know when you need to rest. °PREVENTION °A pneumococcal shot (vaccine) is available to prevent a common bacterial cause of pneumonia. This is usually suggested for: °· People over 65 years old. °· Patients on chemotherapy. °· People with chronic lung problems, such as bronchitis or emphysema. °· People with immune system problems. °If you are over 65 or have a high risk condition, you may receive the pneumococcal vaccine if you have not received it before. In some countries, a routine influenza vaccine is also recommended. This vaccine can help prevent some cases of pneumonia. You may be offered the influenza vaccine as part of your care. °If you smoke, it is time to quit. You may receive instructions on how to stop smoking. Your health care provider can provide medicines and counseling to help you quit. °SEEK MEDICAL CARE IF: °You have a fever. °SEEK IMMEDIATE MEDICAL CARE IF:  °· Your illness becomes worse. This is especially true if you are elderly or weakened from any other disease. °· You cannot control your cough with suppressants and are losing sleep. °· You begin coughing up blood. °· You develop pain which is getting worse or is uncontrolled with medicines. °· Any of the symptoms   which initially brought you in for treatment are getting worse rather than better. °· You develop shortness of breath or chest pain. °MAKE SURE YOU:  °· Understand these instructions. °· Will watch your condition. °· Will get help right away if you are not doing well or get worse. °Document Released: 10/15/2005 Document Revised: 03/01/2014 Document Reviewed: 01/04/2011 °ExitCare® Patient Information ©2015  [employer]. This information is not intended to replace advice given to you by your health care provider. Make sure you discuss any questions you have with your health care provider. ° ° °Emergency Department Resource Guide °1) Find a Doctor and Pay Out of Pocket °Although you won't have to find out who is covered by your insurance plan, it is a good idea to ask around and get recommendations. You will then need to call the office and see if the doctor you have chosen will accept you as a new patient and what types of options they offer for patients who are self-pay. Some doctors offer discounts or will set up payment plans for their patients who do not have insurance, but you will need to ask so you aren't surprised when you get to your appointment. ° °2) Contact Your Local Health Department °Not all health departments have doctors that can see patients for sick visits, but many do, so it is worth a call to see if yours does. If you don't know where your local health department is, you can check in your phone book. The CDC also has a tool to help you locate your state's health department, and many state websites also have listings of all of their local health departments. ° °3) Find a Walk-in Clinic °If your illness is not likely to be very severe or complicated, you may want to try a walk in clinic. These are popping up all over the country in pharmacies, drugstores, and shopping centers. They're usually staffed by nurse practitioners or physician assistants that have been trained to treat common illnesses and complaints. They're usually fairly quick and inexpensive. However, if you have serious medical issues or chronic medical problems, these are probably not your best option. ° °No Primary Care Doctor: °- Call [facility] at  832-8000 - they can help you locate a primary care doctor that  accepts your insurance, provides certain services, etc. °- Physician Referral Service- 1-800-533-3463 ° °Chronic Pain  Problems: °Organization         Address  Phone   Notes  °[facility]  (336) 297-2271 Patients need to be referred by their primary care doctor.  ° °Medication Assistance: °Organization         Address  Phone   Notes  °[place] [facility] [address] °[place], [place] [address] (336) 641-8030 --Must be a resident of [place] °-- Must have NO insurance coverage whatsoever (no Medicaid/ Medicare, etc.) °-- The pt. MUST have a primary care doctor that directs their care regularly and follows them in the community °  °[facility]  (866) 331-1348   °[employer]  (888) 892-1162   ° °Agencies that provide inexpensive medical care: °Organization         Address  Phone   Notes  °[facility]  (336) 832-8035   ° Internal Medicine    (336) 832-7272   °[facility] [address] °[place], [place] [address] (336) 832-4777   °[facility] [address], °[place] (336) 271-4999   °  [facility]    (336) 373-0678   °[facility]    (336) 272-1050   °[facility] ° [address], [place] Phone:  (336) 832-4444, Fax:  (336) 832-4440 Hours of Operation:  9 am - 6 pm, M-F.  Also accepts Medicaid/Medicare and self-pay.  °[facility] ° [address], [place] Phone: (336) 832-3150, Fax: (336) 832-3151. Hours of Operation:  8:30 am - 5:30 pm, M-F.  Also accepts Medicaid and self-pay.  °[facility] [place] [address], [place] Phone: (336) 878-6027   °[facility] [address], [place], [place] (336)723-1848, Ext. 123 Mondays & Thursdays: 7-9 AM.  First 15 patients are seen on a first come, first serve basis. °  ° °Medicaid-accepting [place] Providers: ° °Organization         Address  Phone   Notes  °[facility] [address], [place] (336) 641-2100 Also  accepts self-pay patients.  °[facility] [address], [place] ° (336) 856-9996   °[facility] [address], [place] (336) 288-8857   °[facility] [address] [place] [address], [place] (336) 299-7000   °Veita Bland [address], [place]  ° (336) 373-1557 Only accepts [place] Access Medicaid patients after they have their name applied to their card.  ° °Self-Pay (no insurance) in [place]: ° °Organization         Address  Phone   Notes  °Sickle Cell Patients, [facility] [address], [place] (336) 832-1970   °[facility] [address], [place] (336) 832-4400   ° [facility] ° [address], [place] (336) 992-4800   °[facility]/Dr. Osei-Bonsu ° [address] [place] [address], [place] or [address], [place] (336) 841-8500 Phone number for both [place] and [place] locations is the same.  °[facility] [address], [place] (336) 299-0000   °[facility] [address] [place] [address], [place] or [address] (336) 852-7530 °(336) 878-2260   °[facility] [address], [place] (336) 350-1642, phone; (336) 294-5005, fax Sees patients 1st and 3rd Saturday of every month.  Must not qualify for public or private insurance (i.e. Medicaid, Medicare, [place] Health Choice, Veterans' Benefits) • Household income should be no more than 200% of the poverty level •The clinic cannot treat you if you are pregnant or think you are pregnant • Sexually transmitted diseases are not treated at the clinic.  ° ° °Dental Care: °Organization         Address  Phone  Notes  °[facility] [address], [place] (336) 641-6152 Accepts children up to age 21 who are enrolled in Medicaid or [place] Health Choice; pregnant  women with a Medicaid card; and children who have applied for Medicaid or [place] Health Choice, but were declined, whose parents can pay a reduced fee at time of service.  °[facility] [place]  [address], [place] (336) 641-7733 Accepts children up to age 21 who are enrolled in Medicaid or [place] Health Choice; pregnant women with a Medicaid card; and children who have applied for Medicaid or  Health Choice, but were declined,   whose parents can pay a reduced fee at time of service.  °[facility] ° [address], [place] (336) 641-4533 Patients are seen by appointment only. Walk-ins are not accepted. [facility] will see patients 18 years of age and older. °Monday - Tuesday (8am-5pm) °Most Wednesdays (8:30-5pm) °$30 per visit, cash only  °[facility] ° [address], [place] (336) 641-4533 Patients are seen by appointment only. Walk-ins are not accepted. [facility] will see patients 18 years of age and older. °One Wednesday Evening (Monthly: Volunteer Based).  $30 per visit, cash only  °[facility]  (919) 537-3737 for adults; Children under age 4, call Graduate Pediatric Dentistry at (919) [phone]. Children aged 4-14, please call (919) 537-3737 to request a pediatric application. ° Dental services are provided in all areas of dental care including fillings, crowns and bridges, complete and partial dentures, implants, gum treatment, root canals, and extractions. Preventive care is also provided. Treatment is provided to both adults and children. °Patients are selected via a lottery and there is often a waiting list. °  °[facility] [address], °[place] ° (336) 763-8833 [email or web address] °  °[facility] [address], [place], [place] (336)723-1848, Ext. 123 Second and Fourth Thursday of each month, opens at 6:30 AM; Clinic ends at 9 AM.  Patients are  seen on a first-come first-served basis, and a limited number are seen during each clinic.  ° °[facility] ° [address], [place], [place] (336) 723-7904   Eligibility Requirements °You must have lived in [place], or [place] counties for at least the last three months. °  You cannot be eligible for state or federal sponsored healthcare insurance, including Veterans Administration, Medicaid, or Medicare. °  You generally cannot be eligible for healthcare insurance through your employer.  °  How to apply: °Eligibility screenings are held every Tuesday and Wednesday afternoon from 1:00 pm until 4:00 pm. You do not need an appointment for the interview!  °[facility] [address], [place], [place] 336-631-2330   °[facility]  336-342-8273   °[facility]  336-703-3100   ° [facility]  336-570-6415   ° °Behavioral Health Resources in the Community: °Intensive Outpatient Programs °Organization         Address  Phone  Notes  °[facility] [address], [place], [place] 336-878-6098   °[facility] [address], [place], [place] 336-832-9800   °[facility] [address], [place], [place] ° 336-882-2125   °[facility] [address],  °[place], [place] 1-800-853-5163 or 336-641-4981   °Substance Abuse Resources °Organization         Address  Phone  Notes  °[facility]  336-882-2125   °[facility]  336-784-9470   °[facility]  336-285-9073   °[facility]  336-845-3988   °Residential & Outpatient Substance Abuse Program  1-800-659-3381   °Psychological Services °Organization         Address  Phone  Notes  °[facility]  336- 832-9600   °[facility]  336- 378-7881   °[facility] [address], [place] 1-800-853-5163 or 336-641-4981   ° °Mobile Crisis  Teams °Organization         Address  Phone  Notes  °[facility]   Crisis Care Unit  1-877-626-1772   °Assertive °[facility] ° [address] [place], [place] 336-834-9664   °Sharon DeEsch [address] °[place] 336-554-5454   ° °Self-Help/Support Groups °Organization         Address  Phone             Notes  °[facility] [place] - variety of support groups  336- 373-1402 Call for more information  °Narcotics Anonymous (NA), [facility] [address], °[place]  2 meetings at this location  ° °Residential Treatment Programs °Organization         Address  Phone  Notes  °[facility] [address],    °[place]  1-866-801-8205   °[facility] ° [address], [place], [place] 704-293-8524   °[facility] [address], [place] 336-845-3988 Admissions: 8am-3pm M-F  °[facility] [address]    °[place], [place] 336-841-1104   °[facility] [address], [place], [place] 336-379-7146   °[facility] [address]  °[place], [place] 336-285-9073   °[facility] - Intensive Outpatient [address], [place], [place] 336-852-3033   °[facility] [address]  °[place], [place] 1-877-615-2722 or 336-784-9470   °[facility] [address] [place], [place] 336-227-7417 Accepts Medicaid  °[facility] [address]  ° [place] 1-800-659-3381 Substance Abuse/Addiction Treatment  ° °[place] Behavioral Health Resources °Organization         Address  Phone  Notes  °[facility]  (888) 581-9988   °Julie Brannon, PhD [address] [place], [place]   (336) 349-5553 or (336) 951-0000   °[facility]   [address] °[place], [place] (336) 349-4454   °[facility] [address], [place], [place] (336) 342-8316  Insurance/Medicaid/sponsorship through [facility]  °[facility] [address]                                    [place], [place] (336) 342-8316 Therapy/tele-psych/case  °[facility] [address]  ° [place], [place] (336) 349-2233    °Dr. Arfeen  (336) 349-4544   °[facility] [place]  [employer] [place] [facility] 1) [address],  °2) [address], [place] °3)  [address], [place] (336) 349-3220 °(336) 342-7768 ° °(336) 342-8140   °[place] Child Abuse [facility] (336) 342-1394 or (336) 342-3537 (After Hours)    ° ° ° °

## 2014-09-12 NOTE — ED Notes (Signed)
She c/o fever/cough and persistent headache with sore throat since Fri. (2 days ago).

## 2015-10-30 ENCOUNTER — Encounter (HOSPITAL_COMMUNITY): Payer: Self-pay | Admitting: Emergency Medicine

## 2015-10-30 ENCOUNTER — Emergency Department (HOSPITAL_COMMUNITY)
Admission: EM | Admit: 2015-10-30 | Discharge: 2015-10-30 | Disposition: A | Discharge status: Home / Self Care | Payer: BLUE CROSS/BLUE SHIELD | Attending: Emergency Medicine | Admitting: Emergency Medicine

## 2015-10-30 ENCOUNTER — Emergency Department (HOSPITAL_COMMUNITY): Payer: BLUE CROSS/BLUE SHIELD

## 2015-10-30 DIAGNOSIS — F419 Anxiety disorder, unspecified: Secondary | ICD-10-CM | POA: Diagnosis not present

## 2015-10-30 DIAGNOSIS — R05 Cough: Secondary | ICD-10-CM

## 2015-10-30 DIAGNOSIS — K219 Gastro-esophageal reflux disease without esophagitis: Secondary | ICD-10-CM | POA: Diagnosis not present

## 2015-10-30 DIAGNOSIS — Z79899 Other long term (current) drug therapy: Secondary | ICD-10-CM | POA: Insufficient documentation

## 2015-10-30 DIAGNOSIS — F17210 Nicotine dependence, cigarettes, uncomplicated: Secondary | ICD-10-CM | POA: Insufficient documentation

## 2015-10-30 DIAGNOSIS — J209 Acute bronchitis, unspecified: Secondary | ICD-10-CM | POA: Diagnosis not present

## 2015-10-30 DIAGNOSIS — I10 Essential (primary) hypertension: Secondary | ICD-10-CM | POA: Insufficient documentation

## 2015-10-30 DIAGNOSIS — F329 Major depressive disorder, single episode, unspecified: Secondary | ICD-10-CM | POA: Insufficient documentation

## 2015-10-30 DIAGNOSIS — R030 Elevated blood-pressure reading, without diagnosis of hypertension: Secondary | ICD-10-CM

## 2015-10-30 DIAGNOSIS — R053 Chronic cough: Secondary | ICD-10-CM

## 2015-10-30 DIAGNOSIS — R0989 Other specified symptoms and signs involving the circulatory and respiratory systems: Secondary | ICD-10-CM

## 2015-10-30 DIAGNOSIS — F17200 Nicotine dependence, unspecified, uncomplicated: Secondary | ICD-10-CM

## 2015-10-30 MED ORDER — LEVOFLOXACIN 750 MG PO TABS
750.0000 mg | ORAL_TABLET | Freq: Once | ORAL | Status: AC
  Administered 2015-10-30: 750 mg via ORAL
  Filled 2015-10-30: qty 1

## 2015-10-30 MED ORDER — LEVOFLOXACIN 750 MG PO TABS: 750.0000 mg | ORAL_TABLET | Freq: Every day | ORAL | Status: DC

## 2015-10-30 MED ORDER — ALBUTEROL SULFATE HFA 108 (90 BASE) MCG/ACT IN AERS
2.0000 | INHALATION_SPRAY | Freq: Once | RESPIRATORY_TRACT | Status: AC
  Administered 2015-10-30: 2 via RESPIRATORY_TRACT
  Filled 2015-10-30: qty 6.7

## 2015-10-30 NOTE — Discharge Instructions (Signed)
Take 2 puffs of the inhaler every 4 hours, try to quit smoking.  Do not hesitate to return to the emergency room for any new, worsening or concerning symptoms.  Please obtain primary care using resource guide below. Let them know that you were seen in the emergency room and that they will need to obtain records for further outpatient management.   Acute Bronchitis Bronchitis is inflammation of the airways that extend from the windpipe into the lungs (bronchi). The inflammation often causes mucus to develop. This leads to a cough, which is the most common symptom of bronchitis.  In acute bronchitis, the condition usually develops suddenly and goes away over time, usually in a couple weeks. Smoking, allergies, and asthma can make bronchitis worse. Repeated episodes of bronchitis may cause further lung problems.  CAUSES Acute bronchitis is most often caused by the same virus that causes a cold. The virus can spread from person to person (contagious) through coughing, sneezing, and touching contaminated objects. SIGNS AND SYMPTOMS   Cough.   Fever.   Coughing up mucus.   Body aches.   Chest congestion.   Chills.   Shortness of breath.   Sore throat.  DIAGNOSIS  Acute bronchitis is usually diagnosed through a physical exam. Your health care provider will also ask you questions about your medical history. Tests, such as chest X-rays, are sometimes done to rule out other conditions.  TREATMENT  Acute bronchitis usually goes away in a couple weeks. Oftentimes, no medical treatment is necessary. Medicines are sometimes given for relief of fever or cough. Antibiotic medicines are usually not needed but may be prescribed in certain situations. In some cases, an inhaler may be recommended to help reduce shortness of breath and control the cough. A cool mist vaporizer may also be used to help thin bronchial secretions and make it easier to clear the chest.  HOME CARE INSTRUCTIONS  Get  plenty of rest.   Drink enough fluids to keep your urine clear or pale yellow (unless you have a medical condition that requires fluid restriction). Increasing fluids may help thin your respiratory secretions (sputum) and reduce chest congestion, and it will prevent dehydration.   Take medicines only as directed by your health care provider.  If you were prescribed an antibiotic medicine, finish it all even if you start to feel better.  Avoid smoking and secondhand smoke. Exposure to cigarette smoke or irritating chemicals will make bronchitis worse. If you are a smoker, consider using nicotine gum or skin patches to help control withdrawal symptoms. Quitting smoking will help your lungs heal faster.   Reduce the chances of another bout of acute bronchitis by washing your hands frequently, avoiding people with cold symptoms, and trying not to touch your hands to your mouth, nose, or eyes.   Keep all follow-up visits as directed by your health care provider.  SEEK MEDICAL CARE IF: Your symptoms do not improve after 1 week of treatment.  SEEK IMMEDIATE MEDICAL CARE IF:  You develop an increased fever or chills.   You have chest pain.   You have severe shortness of breath.  You have bloody sputum.   You develop dehydration.  You faint or repeatedly feel like you are going to pass out.  You develop repeated vomiting.  You develop a severe headache. MAKE SURE YOU:   Understand these instructions.  Will watch your condition.  Will get help right away if you are not doing well or get worse.   This information  is not intended to replace advice given to you by your health care provider. Make sure you discuss any questions you have with your health care provider.   Document Released: 11/22/2004 Document Revised: 11/05/2014 Document Reviewed: 04/07/2013 Elsevier Interactive Patient Education 2016 [employer]   Emergency Department Resource Guide 1) Find a Doctor and Pay  Out of Pocket Although you won't have to find out who is covered by your insurance plan, it is a good idea to ask around and get recommendations. You will then need to call the office and see if the doctor you have chosen will accept you as a new patient and what types of options they offer for patients who are self-pay. Some doctors offer discounts or will set up payment plans for their patients who do not have insurance, but you will need to ask so you aren't surprised when you get to your appointment.  2) Contact Your Local Health Department Not all health departments have doctors that can see patients for sick visits, but many do, so it is worth a call to see if yours does. If you don't know where your local health department is, you can check in your phone book. The CDC also has a tool to help you locate your state's health department, and many state websites also have listings of all of their local health departments.  3) Find a Walk-in Clinic If your illness is not likely to be very severe or complicated, you may want to try a walk in clinic. These are popping up all over the country in pharmacies, drugstores, and shopping centers. They're usually staffed by nurse practitioners or physician assistants that have been trained to treat common illnesses and complaints. They're usually fairly quick and inexpensive. However, if you have serious medical issues or chronic medical problems, these are probably not your best option.  No Primary Care Doctor: - Call Health Connect at  [phone] - they can help you locate a primary care doctor that  accepts your insurance, provides certain services, etc. - Physician Referral Service- [phone]  Chronic Pain Problems: Organization         Address  Phone   Notes  [facility]  [phone] Patients need to be referred by their primary care doctor.   Medication Assistance: Organization         Address  Phone   Notes  [facility]  Medication [facility] [address] [place], [place] [address] [phone]) [phone] --Must be a resident of [facility] [place] -- Must have NO insurance coverage whatsoever (no Medicaid/ Medicare, etc.) -- The pt. MUST have a primary care doctor that directs their care regularly and follows them in the community   MedAssist  [phone]   [employer]  [phone]    Agencies that provide inexpensive medical care: Organization         Address  Phone   Notes  [facility]  [phone]   [facility]    [phone]   [facility] [address] [place], [place] [address] [phone]) [phone]   [facility] [address], [place] [phone](336) [phone]   [facility]anned [facility]    [phone]   [facility]    [phone]   [facility]  [address], [place] Phone:  [phone]) [phone], Fax:  [phone]) [phone] Hours of Operation:  9 am - 6 pm,  M-F.  Also accepts Medicaid/Medicare and self-pay.  [facility]  [address], [place] Phone: [phone], Fax: [phone]. Hours of Operation:  8:30 am - 5:30 pm, M-F.  Also accepts Medicaid and self-pay.  [facility] [place] [address] [place] Phone: [phone]   [facility] [address] Natasha Bence [place], [place] [phone], Ext. [phone] Mondays & Thursdays: 7-9 AM.  First 15 patients are seen on a first come, first serve basis.    Medicaid-accepting [facility] Providers:  Organization         Address  Phone   Notes  [facility] [address],  [phone] Also accepts self-pay patients.  [facility] [address] Laurell Josephs [place], [place]  [phone]   [facility] [address], [place] [phone]   [facility] [address] [place] [phone]   Renaye Rakers [address], [place]   [phone] Only accepts [place] patients after they have their name applied to their card.   Self-Pay (no insurance) in [facility]:  Organization         Address  Phone   Notes  Sickle Cell Patients, [facility] [address] [place], [place] [phone]   [facility] [address] [place], [place] [phone]   [facility] [place]  [address], [place] [phone]   [facility]/Dr. Osei-Bonsu  [address] [place] or [address], [place] [phone] Phone number for both [place] and [place] locations is the same.  [facility] [address], [place] [phone]   [facility] [address], [place] or [address] [phone]   [facility] [address], [place] [phone], phone; [phone], fax Sees patients 1st and 3rd Saturday of every month.  Must not qualify for public or private insurance (i.e. Medicaid, Medicare, [place] Health Choice, Veterans' Benefits)  Household income should be no more than 200% of the poverty level The clinic cannot treat you if you are pregnant or think you are pregnant  Sexually transmitted diseases are not treated at the clinic.    Dental Care: Organization         Address  Phone  Notes  [facility] [address] [place], [place] [phone] Accepts children up to age 62 who are enrolled in [place] or [place] Health Choice; pregnant women with a Medicaid card; and children who have applied for Medicaid or [place] Health Choice, but were declined, whose parents can pay a reduced fee at time of service.  [facility]  [address], [place]  [phone] Accepts children up to age 51 who are enrolled in [place] or [place] Health Choice; pregnant women with a Medicaid card; and children who have applied for Medicaid or [place] Health Choice, but were declined, whose parents can pay a reduced fee at time of service.  [facility]  [address] [place], [place] [phone] Patients are seen by appointment only. Walk-ins are not accepted. [facility] will see patients 72 years of age and older. Monday -  Tuesday (8am-5pm) Most Wednesdays (8:30-5pm) $30 per visit, cash only  [facility] Adult Dental Access PRO[facility]  [address], [facility] [phone] Patients are seen by appointment only. Walk-ins are not accepted. [facility] will see patients 44 years of age and older. One Wednesday Evening (Monthly: Volunteer Based).  $30 per visit, cash only  [employer]  [phone] for adults; Children under age 67, call Graduate Pediatric Dentistry at [phone]. Children aged 44-14, please call [phone] to request a pediatric application.  Dental services are provided in all areas of dental care including fillings, crowns and bridges, complete and partial dentures, implants, gum treatment, root canals, and extractions. Preventive care is also provided. Treatment is provided to both adults and children. Patients are selected via a lottery and there is often a waiting list.   [facility] [address], [place]  [phone] [email or web address]   [facility] [address] [place], [place] [phone], Ext. [phone] Second and Fourth Thursday of each month, opens at 6:30 AM; Clinic ends at 9 AM.  Patients are seen on a first-come first-served basis, and a limited number are seen during each clinic.   [facility]  [address] Ether Griffins [place], [place] [phone]   Eligibility Requirements You must have lived in [place], [place], or [place]  counties for at least the last three months.   You cannot be eligible for state or federal sponsored [place], including [employer], [place], or [employer].   You generally cannot be eligible for healthcare insurance through your employer.    How to apply: Eligibility screenings are held every Tuesday and Wednesday afternoon from 1:00 pm until 4:00 pm. You do not need an appointment for the interview!  [facility] [address] [place], [place] [phone]   [facility]  [phone]   [facility]  [phone]   [facility] Health Department  [phone]    Behavioral Health Resources in the Community: Intensive Outpatient Programs Organization         Address  Phone  Notes  [facility] Services [address] [place], [place] [phone]   [facility] Outpatient [address] [place], [place] [phone]   [facility] [address] [place], [place]  [phone]   [facility] [address]  [place], [place] [phone] or [phone]   Substance Abuse Resources Organization         Address  Phone  Notes  [facility]  [phone]   [facility]  [phone]   [facility]  [phone]   Floydene Flock  [phone]   Residential & Outpatient Substance Abuse Program  [phone]   Psychological Services Organization         Address  Phone  Notes  [facility]  [phone]   [facility] Services  [phone]   [facility] [address] [place] [phone] or [phone]    Mobile Crisis Teams Organization         Address  Phone  Notes  [facility], Mobile Crisis Care Unit  [phone]   [facility]  [address] [place], [place] [phone]   Doristine Locks [address] [place]  [place] [phone]    Self-Help/Support Groups Organization         Address  Phone  Notes  [facility] [place] - variety of support groups  336- I7437963 Call for more information  Narcotics Anonymous (NA), [facility] [address], [employer] [place]  2 meetings at this location   Statistician         Address  Phone  Notes  [facility] [address],    [place]  [phone]   [facility]  [address] [place] [address], [place], [place] [phone]   [facility] [address] [place], [place] [phone] Admissions: 8am-3pm M-F  [facility] [address]    [place], [place] [phone]   [facility] [address] [place], [place], [place] [phone]   [facility] [address]  [place], [place] [phone]   [facility] - Intensive Outpatient [address] Laurell Josephs [address], [place], [place] [phone]   [facility] ([facility] [address] [place]  [place], [place] [phone] or [phone]   [facility] ([facility]) [address] [place], [place] [phone] Accepts Medicaid  [facility] [address],  [place] [phone] Substance Abuse/Addiction Treatment   [facility] Organization         Address  Phone  Notes  [facility]  [phone]   Angie Fava, PhD [address] Ervin Knack [place], [place]   [phone] or [phone]   [facility] Behavioral   [address] [place], [place] [phone]   [facility] [address], [place], [place] [phone] Insurance/Medicaid/sponsorship through [facility] and Families [address]                                    [place], [place] [phone] Therapy/tele-psych/case  [facility] [address], [place] [phone]    Dr. Lolly Mustache  [phone]   [facility] [place]  [employer] [facility] Dept. 1) [address],  2) [address], [place] 3)  [address], [place] [phone]  [phone]   [facility] Child Abuse Hotline 575-805-8158 or [phone] (After Hours)

## 2015-10-30 NOTE — ED Notes (Signed)
PA to see and assess patient before RN assessment. See PA assessment. 

## 2015-10-30 NOTE — ED Notes (Signed)
Pt. reports persistent productive cough with sinus congestion/chest congestion for 1 week , denies fever or chills.

## 2015-10-30 NOTE — ED Provider Notes (Signed)
CSN: [id number]     Arrival date & time 10/30/15  2000 History   First MD Initiated Contact with Patient 10/30/15 2019     Chief Complaint  Patient presents with  . Cough  . Nasal Congestion     (Consider location/radiation/quality/duration/timing/severity/associated sxs/prior Treatment) HPI   Blood pressure 174/92, pulse 81, temperature 98.4 F (36.9 C), temperature source Oral, resp. rate 16, height 5\' 2"  (1.575 m), weight 75.978 kg, last menstrual period 10/02/2012, SpO2 94 %.  Courtney Blackburn is a 60 y.o. female complaining of complaining of intermittently productive cough, rhinorrhea, shortness of breath with no chest pain, fever, chills, nausea, vomiting, increasing peripheral edema, dyspnea on exertion, orthopnea, PND.  Past Medical History  Diagnosis Date  . Hiatal hernia   . Anxiety   . Depression   . Hypertension   . Acid reflux    Past Surgical History  Procedure Laterality Date  . Tubal ligation    . Appendectomy    . Wisdom tooth extraction     No family history on file. Social History  Substance Use Topics  . Smoking status: Current Every Day Smoker -- 0.00 packs/day    Types: Cigarettes  . Smokeless tobacco: None  . Alcohol Use: No   OB History    Gravida Para Term Preterm AB TAB SAB Ectopic Multiple Living   1         1     Review of Systems  10 systems reviewed and found to be negative, except as noted in the HPI.   Allergies  Codeine  Home Medications   Prior to Admission medications   Medication Sig Start Date End Date Taking? Authorizing Provider  acetaminophen (TYLENOL) 500 MG tablet Take 500 mg by mouth every 4 (four) hours as needed for fever.    Historical Provider, MD  azithromycin (ZITHROMAX) 250 MG tablet Take 1 tablet (250 mg total) by mouth daily. Take first 2 tablets together, then 1 every day until finished. 09/12/14   Courtney Forcucci, PA-C  DULoxetine (CYMBALTA) 60 MG capsule Take 60 mg by mouth daily.    Historical  Provider, MD  guaiFENesin (ROBITUSSIN) 100 MG/5ML liquid Take 5-10 mLs (100-200 mg total) by mouth every 4 (four) hours as needed for cough. 09/12/14   Courtney Forcucci, PA-C  ibuprofen (ADVIL,MOTRIN) 200 MG tablet Take 800 mg by mouth every 6 (six) hours as needed for fever.    Historical Provider, MD  LORazepam (ATIVAN) 1 MG tablet Take 1 tablet (1 mg total) by mouth 3 (three) times daily as needed for anxiety. Patient not taking: Reported on 09/12/2014 04/14/14   Mancel Bale, MD  naproxen (NAPROSYN) 500 MG tablet Take 1 tablet (500 mg total) by mouth 2 (two) times daily with a meal. Patient not taking: Reported on 09/12/2014 02/17/14   Dahlia Client Muthersbaugh, PA-C  omeprazole (PRILOSEC) 20 MG capsule Take 20 mg by mouth 2 (two) times daily before a meal.     Historical Provider, MD   BP 174/92 mmHg  Pulse 81  Temp(Src) 98.4 F (36.9 C) (Oral)  Resp 16  Ht 5\' 2"  (1.575 m)  Wt 75.978 kg  BMI 30.63 kg/m2  SpO2 94%  LMP 10/02/2012 Physical Exam  Constitutional: She is oriented to person, place, and time. She appears well-developed and well-nourished. No distress.  HENT:  Head: Normocephalic and atraumatic.  Mouth/Throat: Oropharynx is clear and moist.  No drooling or stridor. Posterior pharynx mildly erythematous no significant tonsillar hypertrophy. No exudate. Soft palate rises  symmetrically. No TTP or induration under tongue.   No tenderness to palpation of frontal or bilateral maxillary sinuses.  No mucosal edema in the nares.  Bilateral tympanic membranes with normal architecture and good light reflex.    Eyes: Conjunctivae and EOM are normal. Pupils are equal, round, and reactive to light.  Neck: Normal range of motion. No JVD present. No tracheal deviation present.  Cardiovascular: Normal rate, regular rhythm and intact distal pulses.   Pulmonary/Chest: Effort normal and breath sounds normal. No stridor. No respiratory distress. She has no wheezes. She has no rales. She  exhibits no tenderness.  Abdominal: Soft. Bowel sounds are normal. She exhibits no distension and no mass. There is no tenderness. There is no rebound and no guarding.  Musculoskeletal: Normal range of motion. She exhibits no edema or tenderness.  No calf asymmetry, superficial collaterals, palpable cords, edema, Homans sign negative bilaterally.    Neurological: She is alert and oriented to person, place, and time.  Skin: Skin is warm. She is not diaphoretic.  Psychiatric: She has a normal mood and affect.  Nursing note and vitals reviewed.   ED Course  Procedures (including critical care time) Labs Review Labs Reviewed - No data to display  Imaging Review Dg Chest 2 View  10/30/2015  CLINICAL DATA:  Chest congestion and persisting cough for 1 week. Shortness of breath. EXAM: CHEST  2 VIEW COMPARISON:  09/12/2014, 02/17/2014 FINDINGS: Progressive hyperinflation and bronchitic change. No consolidation. Heart size and mediastinal contours are normal. No pulmonary edema, pleural effusion or pneumothorax. No acute osseous abnormalities are seen. IMPRESSION: Increased hyperinflation and bronchial thickening, suspect acute bronchitis superimposed on chronic change. Electronically Signed   By: Rubye OaksMelanie  Ehinger M.D.   On: 10/30/2015 20:31   I have personally reviewed and evaluated these images and lab results as part of my medical decision-making.   EKG Interpretation None      MDM   Final diagnoses:  Acute bronchitis, unspecified organism  Tobacco use disorder  Elevated blood pressure reading    Filed Vitals:   10/30/15 2006  BP: 174/92  Pulse: 81  Temp: 98.4 F (36.9 C)  TempSrc: Oral  Resp: 16  Height: 5\' 2"  (1.575 m)  Weight: 75.978 kg  SpO2: 94%    Medications  levofloxacin (LEVAQUIN) tablet 750 mg (750 mg Oral Given 10/30/15 2052)  albuterol (PROVENTIL HFA;VENTOLIN HFA) 108 (90 Base) MCG/ACT inhaler 2 puff (2 puffs Inhalation Given 10/30/15 2052)    Courtney Blackburn  is 60 y.o. female presenting with cough, shortness of breath. Lung sounds are clear, she saturating well on room air. Patient is not tachypnea Kurt tachycardic. Chest x-rays without infiltrate. I've counseled this patient for approximately 10 minutes on smoking cessation. Patient states that she does not have a primary care physician but recently obtained insurance. Outpatient primary care referral given. Patient will be started on Levaquin for bronchitis exacerbation in the setting of sputum production and a smoker. Pt given albuterol inhaler. Her blood pressure is elevated today, counseled her that she will need to have this rechecked by primary care. Patient verbalizes understanding.  Evaluation does not show pathology that would require ongoing emergent intervention or inpatient treatment. Pt is hemodynamically stable and mentating appropriately. Discussed findings and plan with patient/guardian, who agrees with care plan. All questions answered. Return precautions discussed and outpatient follow up given.   New Prescriptions   LEVOFLOXACIN (LEVAQUIN) 750 MG TABLET    Take 1 tablet (750 mg total) by mouth daily.  X 7 days         Wynetta Emery, PA-C 10/30/15 2059  Derwood Kaplan, MD 11/02/15 [phone]

## 2015-10-30 NOTE — ED Notes (Signed)
Patient verbalized understanding of discharge instructions and denies any further needs or questions at this time. VS stable. Patient ambulatory with steady gait.  

## 2016-02-14 ENCOUNTER — Ambulatory Visit (INDEPENDENT_AMBULATORY_CARE_PROVIDER_SITE_OTHER): Payer: BLUE CROSS/BLUE SHIELD | Admitting: Family Medicine

## 2016-02-14 VITALS — BP 170/94 | HR 88 | Temp 98.0°F | Resp 18 | Ht 62.0 in | Wt 169.0 lb

## 2016-02-14 DIAGNOSIS — F329 Major depressive disorder, single episode, unspecified: Secondary | ICD-10-CM

## 2016-02-14 DIAGNOSIS — I10 Essential (primary) hypertension: Secondary | ICD-10-CM | POA: Diagnosis not present

## 2016-02-14 DIAGNOSIS — F32A Depression, unspecified: Secondary | ICD-10-CM | POA: Insufficient documentation

## 2016-02-14 MED ORDER — LOSARTAN POTASSIUM-HCTZ 100-12.5 MG PO TABS: 1.0000 | ORAL_TABLET | Freq: Every day | ORAL | Status: DC

## 2016-02-14 MED ORDER — DULOXETINE HCL 60 MG PO CPEP: 60.0000 mg | ORAL_CAPSULE | Freq: Every day | ORAL | Status: DC

## 2016-02-14 NOTE — Patient Instructions (Addendum)
Hypertension Hypertension, commonly called high blood pressure, is when the force of blood pumping through your arteries is too strong. Your arteries are the blood vessels that carry blood from your heart throughout your body. A blood pressure reading consists of a higher number over a lower number, such as 110/72. The higher number (systolic) is the pressure inside your arteries when your heart pumps. The lower number (diastolic) is the pressure inside your arteries when your heart relaxes. Ideally you want your blood pressure below 120/80. Hypertension forces your heart to work harder to pump blood. Your arteries may become narrow or stiff. Having untreated or uncontrolled hypertension can cause heart attack, stroke, kidney disease, and other problems. RISK FACTORS Some risk factors for high blood pressure are controllable. Others are not.  Risk factors you cannot control include:   Race. You may be at higher risk if you are African American.  Age. Risk increases with age.  Gender. Men are at higher risk than women before age 45 years. After age 65, women are at higher risk than men. Risk factors you can control include:  Not getting enough exercise or physical activity.  Being overweight.  Getting too much fat, sugar, calories, or salt in your diet.  Drinking too much alcohol. SIGNS AND SYMPTOMS Hypertension does not usually cause signs or symptoms. Extremely high blood pressure (hypertensive crisis) may cause headache, anxiety, shortness of breath, and nosebleed. DIAGNOSIS To check if you have hypertension, your health care provider will measure your blood pressure while you are seated, with your arm held at the level of your heart. It should be measured at least twice using the same arm. Certain conditions can cause a difference in blood pressure between your right and left arms. A blood pressure reading that is higher than normal on one occasion does not mean that you need treatment. If  it is not clear whether you have high blood pressure, you may be asked to return on a different day to have your blood pressure checked again. Or, you may be asked to monitor your blood pressure at home for 1 or more weeks. TREATMENT Treating high blood pressure includes making lifestyle changes and possibly taking medicine. Living a healthy lifestyle can help lower high blood pressure. You may need to change some of your habits. Lifestyle changes may include:  Following the DASH diet. This diet is high in fruits, vegetables, and whole grains. It is low in salt, red meat, and added sugars.  Keep your sodium intake below 2,300 mg per day.  Getting at least 30-45 minutes of aerobic exercise at least 4 times per week.  Losing weight if necessary.  Not smoking.  Limiting alcoholic beverages.  Learning ways to reduce stress. Your health care provider may prescribe medicine if lifestyle changes are not enough to get your blood pressure under control, and if one of the following is true:  You are 18-59 years of age and your systolic blood pressure is above 140.  You are 60 years of age or older, and your systolic blood pressure is above 150.  Your diastolic blood pressure is above 90.  You have diabetes, and your systolic blood pressure is over 140 or your diastolic blood pressure is over 90.  You have kidney disease and your blood pressure is above 140/90.  You have heart disease and your blood pressure is above 140/90. Your personal target blood pressure may vary depending on your medical conditions, your age, and other factors. HOME CARE INSTRUCTIONS    Have your blood pressure rechecked as directed by your health care provider.   Take medicines only as directed by your health care provider. Follow the directions carefully. Blood pressure medicines must be taken as prescribed. The medicine does not work as well when you skip doses. Skipping doses also puts you at risk for  problems.  Do not smoke.   Monitor your blood pressure at home as directed by your health care provider. SEEK MEDICAL CARE IF:   You think you are having a reaction to medicines taken.  You have recurrent headaches or feel dizzy.  You have swelling in your ankles.  You have trouble with your vision. SEEK IMMEDIATE MEDICAL CARE IF:  You develop a severe headache or confusion.  You have unusual weakness, numbness, or feel faint.  You have severe chest or abdominal pain.  You vomit repeatedly.  You have trouble breathing. MAKE SURE YOU:   Understand these instructions.  Will watch your condition.  Will get help right away if you are not doing well or get worse.   This information is not intended to replace advice given to you by your health care provider. Make sure you discuss any questions you have with your health care provider.   Document Released: 10/15/2005 Document Revised: 03/01/2015 Document Reviewed: 08/07/2013 Elsevier Interactive Patient Education 2016 [employer] Inc[employer]  

## 2016-02-14 NOTE — Progress Notes (Signed)
This a 60 year old woman who works at [employer]. She's been there 9 years. She [place] in [place] with her daughter and granddaughter.  Patient's had problems with depression for 41 years. She's tried survive without medicine because she has not had good health insurance for some time. She finally took a damage of the affordable care act and got on [facility] and is interested in getting back on her antidepressant which was working well for her.  She taken Prozac in the past but eventually it stopped working.  Patient also has hypertension and needs to get back on medicine. She's had some swelling in her fingers and her feet. It's also had some bloating. She's been checking her blood pressure at work and it's been over 200 systolic.  Patient does smoke and does not think that she can quit.  Objective:BP 170/94 mmHg  Pulse 88  Temp(Src) 98 F (36.7 C) (Oral)  Resp 18  Ht 5\' 2"  (1.575 m)  Wt 169 lb (76.658 kg)  BMI 30.90 kg/m2  SpO2 92%  LMP 10/02/2012 Patient is tearful when talking about her sadness. She is able to smile and has denied any suicidal intention. She says she lives for her 59-year-old granddaughter.  HEENT: Multiple dental caries Chest: Clear Heart: Regular no murmur Abdomen: Soft nontender Extremities: 1+ pedal edema Skin: No rash  Assessment: Chronic depression very nice lady. She is in a better situation of an she's been in years since she was able to move into her house and away from a motel room. We discussed the fact that she needs to find a dentist. I did not push the smoking issue since she just needed to vent about how that she feels.  Plan: Recheck one month. Essential hypertension - Plan: losartan-hydrochlorothiazide (HYZAAR) 100-12.5 MG tablet  Depression - Plan: DULoxetine (CYMBALTA) 60 MG capsule  Signed, Elvina SidleKurt Caydence Enck M.D.

## 2016-02-17 NOTE — Addendum Note (Signed)
Addended by: Isaac BlissGALLOWAY, Dannie Hattabaugh J on: 02/17/2016 10:35 AM   Modules accepted: [employer]Set

## 2016-02-17 NOTE — Addendum Note (Signed)
Addended by: Isaac BlissGALLOWAY, Fritzi Scripter J on: 02/17/2016 10:33 AM   Modules accepted: [employer]Set

## 2016-09-11 ENCOUNTER — Ambulatory Visit (INDEPENDENT_AMBULATORY_CARE_PROVIDER_SITE_OTHER): Payer: BLUE CROSS/BLUE SHIELD | Admitting: Family Medicine

## 2016-09-11 ENCOUNTER — Encounter: Payer: Self-pay | Admitting: Family Medicine

## 2016-09-11 VITALS — BP 130/80 | HR 92 | Temp 99.3°F | Resp 17 | Ht 63.0 in | Wt 170.0 lb

## 2016-09-11 DIAGNOSIS — M545 Low back pain, unspecified: Secondary | ICD-10-CM

## 2016-09-11 DIAGNOSIS — R350 Frequency of micturition: Secondary | ICD-10-CM | POA: Diagnosis not present

## 2016-09-11 DIAGNOSIS — R6883 Chills (without fever): Secondary | ICD-10-CM | POA: Diagnosis not present

## 2016-09-11 DIAGNOSIS — R110 Nausea: Secondary | ICD-10-CM

## 2016-09-11 LAB — POCT URINALYSIS DIP (MANUAL ENTRY)
BILIRUBIN UA: NEGATIVE
Blood, UA: NEGATIVE
GLUCOSE UA: NEGATIVE
LEUKOCYTES UA: NEGATIVE
Nitrite, UA: NEGATIVE
Protein Ur, POC: NEGATIVE
Spec Grav, UA: 1.015
Urobilinogen, UA: 1
pH, UA: 6

## 2016-09-11 LAB — POCT CBC
Granulocyte percent: 70.6 %G (ref 37–80)
HCT, POC: 37.1 % — AB (ref 37.7–47.9)
Hemoglobin: 13 g/dL (ref 12.2–16.2)
Lymph, poc: 2.1 (ref 0.6–3.4)
MCH, POC: 33.9 pg — AB (ref 27–31.2)
MCHC: 35 g/dL (ref 31.8–35.4)
MCV: 97 fL (ref 80–97)
MID (cbc): 0.8 (ref 0–0.9)
MPV: 7.5 fL (ref 0–99.8)
PLATELET COUNT, POC: 237 10*3/uL (ref 142–424)
POC Granulocyte: 7 — AB (ref 2–6.9)
POC LYMPH %: 21.2 % (ref 10–50)
POC MID %: 8.2 %M (ref 0–12)
RBC: 3.83 M/uL — AB (ref 4.04–5.48)
RDW, POC: 13.7 %
WBC: 9.9 10*3/uL (ref 4.6–10.2)

## 2016-09-11 LAB — POC MICROSCOPIC URINALYSIS (UMFC): Mucus: ABSENT

## 2016-09-11 MED ORDER — CYCLOBENZAPRINE HCL 5 MG PO TABS: ORAL_TABLET | ORAL | 0 refills | Status: DC

## 2016-09-11 NOTE — Patient Instructions (Addendum)
The urine test does not indicate infection today. I suspect you have a muscle strain of your low back that may be related to change in activity. Over-the-counter ibuprofen or Aleve, and I sent in a muscle relaxant that can be taken up to every 8 hours if needed. That does cause sedation, cyst or without a bedtime initially. See other information on low back strain and exercises you can perform at home. If you are not improving within the next week to 10 days or any worsening sooner, return for recheck and possible x-rays.  If nausea persists, or any fevers, return sooner. Return to the clinic or go to the nearest emergency room if any of your symptoms worsen or new symptoms occur.   Back Pain, Adult Back pain is very common in adults.The cause of back pain is rarely dangerous and the pain often gets better over time.The cause of your back pain may not be known. Some common causes of back pain include:  Strain of the muscles or ligaments supporting the spine.  Wear and tear (degeneration) of the spinal disks.  Arthritis.  Direct injury to the back. For many people, back pain may return. Since back pain is rarely dangerous, most people can learn to manage this condition on their own. Follow these instructions at home: Watch your back pain for any changes. The following actions may help to lessen any discomfort you are feeling:  Remain active. It is stressful on your back to sit or stand in one place for long periods of time. Do not sit, drive, or stand in one place for more than 30 minutes at a time. Take short walks on even surfaces as soon as you are able.Try to increase the length of time you walk each day.  Exercise regularly as directed by your health care provider. Exercise helps your back heal faster. It also helps avoid future injury by keeping your muscles strong and flexible.  Do not stay in bed.Resting more than 1-2 days can delay your recovery.  Pay attention to your body  when you bend and lift. The most comfortable positions are those that put less stress on your recovering back. Always use proper lifting techniques, including:  Bending your knees.  Keeping the load close to your body.  Avoiding twisting.  Find a comfortable position to sleep. Use a firm mattress and lie on your side with your knees slightly bent. If you lie on your back, put a pillow under your knees.  Avoid feeling anxious or stressed.Stress increases muscle tension and can worsen back pain.It is important to recognize when you are anxious or stressed and learn ways to manage it, such as with exercise.  Take medicines only as directed by your health care provider. Over-the-counter medicines to reduce pain and inflammation are often the most helpful.Your health care provider may prescribe muscle relaxant drugs.These medicines help dull your pain so you can more quickly return to your normal activities and healthy exercise.  Apply ice to the injured area:  Put ice in a plastic bag.  Place a towel between your skin and the bag.  Leave the ice on for 20 minutes, 2-3 times a day for the first 2-3 days. After that, ice and heat may be alternated to reduce pain and spasms.  Maintain a healthy weight. Excess weight puts extra stress on your back and makes it difficult to maintain good posture. Contact a health care provider if:  You have pain that is not relieved with rest  or medicine.  You have increasing pain going down into the legs or buttocks.  You have pain that does not improve in one week.  You have night pain.  You lose weight.  You have a fever or chills. Get help right away if:  You develop new bowel or bladder control problems.  You have unusual weakness or numbness in your arms or legs.  You develop nausea or vomiting.  You develop abdominal pain.  You feel faint. This information is not intended to replace advice given to you by your health care provider.  Make sure you discuss any questions you have with your health care provider. Document Released: 10/15/2005 Document Revised: 02/23/2016 Document Reviewed: 02/16/2014 Elsevier Interactive Patient Education  2017 [employer]  Low Back Strain Rehab Ask your health care provider which exercises are safe for you. Do exercises exactly as told by your health care provider and adjust them as directed. It is normal to feel mild stretching, pulling, tightness, or discomfort as you do these exercises, but you should stop right away if you feel sudden pain or your pain gets worse. Do not begin these exercises until told by your health care provider. Stretching and range of motion exercises These exercises warm up your muscles and joints and improve the movement and flexibility of your back. These exercises also help to relieve pain, numbness, and tingling. Exercise A: Single knee to chest 1. Lie on your back on a firm surface with both legs straight. 2. Bend one of your knees. Use your hands to move your knee up toward your chest until you feel a gentle stretch in your lower back and buttock.  Hold your leg in this position by holding onto the front of your knee.  Keep your other leg as straight as possible. 3. Hold for __________ seconds. 4. Slowly return to the starting position. 5. Repeat with your other leg. Repeat __________ times. Complete this exercise __________ times a day. Exercise B: Prone extension on elbows 1. Lie on your abdomen on a firm surface. 2. Prop yourself up on your elbows. 3. Use your arms to help lift your chest up until you feel a gentle stretch in your abdomen and your lower back.  This will place some of your body weight on your elbows. If this is uncomfortable, try stacking pillows under your chest.  Your hips should stay down, against the surface that you are lying on. Keep your hip and back muscles relaxed. 4. Hold for __________ seconds. 5. Slowly relax your upper  body and return to the starting position. Repeat __________ times. Complete this exercise __________ times a day. Strengthening exercises These exercises build strength and endurance in your back. Endurance is the ability to use your muscles for a long time, even after they get tired. Exercise C: Pelvic tilt 1. Lie on your back on a firm surface. Bend your knees and keep your feet flat. 2. Tense your abdominal muscles. Tip your pelvis up toward the ceiling and flatten your lower back into the floor.  To help with this exercise, you may place a small towel under your lower back and try to push your back into the towel. 3. Hold for __________ seconds. 4. Let your muscles relax completely before you repeat this exercise. Repeat __________ times. Complete this exercise __________ times a day. Exercise D: Alternating arm and leg raises 1. Get on your hands and knees on a firm surface. If you are on a hard floor, you may  want to use padding to cushion your knees, such as an exercise mat. 2. Line up your arms and legs. Your hands should be below your shoulders, and your knees should be below your hips. 3. Lift your left leg behind you. At the same time, raise your right arm and straighten it in front of you.  Do not lift your leg higher than your hip.  Do not lift your arm higher than your shoulder.  Keep your abdominal and back muscles tight.  Keep your hips facing the ground.  Do not arch your back.  Keep your balance carefully, and do not hold your breath. 4. Hold for __________ seconds. 5. Slowly return to the starting position and repeat with your right leg and your left arm. Repeat __________ times. Complete this exercise __________times a day. Exercise J: Single leg lower with bent knees 1. Lie on your back on a firm surface. 2. Tense your abdominal muscles and lift your feet off the floor, one foot at a time, so your knees and hips are bent in an "L" shape (at about 90  degrees).  Your knees should be over your hips and your lower legs should be parallel to the floor. 3. Keeping your abdominal muscles tense and your knee bent, slowly lower one of your legs so your toe touches the ground. 4. Lift your leg back up to return to the starting position.  Do not hold your breath.  Do not let your back arch. Keep your back flat against the ground. 5. Repeat with your other leg. Repeat __________ times. Complete this exercise __________ times a day. Posture and body mechanics   Body mechanics refers to the movements and positions of your body while you do your daily activities. Posture is part of body mechanics. Good posture and healthy body mechanics can help to relieve stress in your body's tissues and joints. Good posture means that your spine is in its natural S-curve position (your spine is neutral), your shoulders are pulled back slightly, and your head is not tipped forward. The following are general guidelines for applying improved posture and body mechanics to your everyday activities. Standing   When standing, keep your spine neutral and your feet about hip-width apart. Keep a slight bend in your knees. Your ears, shoulders, and hips should line up.  When you do a task in which you stand in one place for a long time, place one foot up on a stable object that is 2-4 inches (5-10 cm) high, such as a footstool. This helps keep your spine neutral. Sitting  When sitting, keep your spine neutral and keep your feet flat on the floor. Use a footrest, if necessary, and keep your thighs parallel to the floor. Avoid rounding your shoulders, and avoid tilting your head forward.  When working at a desk or a computer, keep your desk at a height where your hands are slightly lower than your elbows. Slide your chair under your desk so you are close enough to maintain good posture.  When working at a computer, place your monitor at a height where you are looking straight  ahead and you do not have to tilt your head forward or downward to look at the screen. Resting   When lying down and resting, avoid positions that are most painful for you.  If you have pain with activities such as sitting, bending, stooping, or squatting (flexion-based activities), lie in a position in which your body does not bend very much.  For example, avoid curling up on your side with your arms and knees near your chest (fetal position).  If you have pain with activities such as standing for a long time or reaching with your arms (extension-based activities), lie with your spine in a neutral position and bend your knees slightly. Try the following positions:  Lying on your side with a pillow between your knees.  Lying on your back with a pillow under your knees. Lifting   When lifting objects, keep your feet at least shoulder-width apart and tighten your abdominal muscles.  Bend your knees and hips and keep your spine neutral. It is important to lift using the strength of your legs, not your back. Do not lock your knees straight out.  Always ask for help to lift heavy or awkward objects. This information is not intended to replace advice given to you by your health care provider. Make sure you discuss any questions you have with your health care provider. Document Released: 10/15/2005 Document Revised: 06/21/2016 Document Reviewed: 07/27/2015 Elsevier Interactive Patient Education  2017 [employer].    IF you received an x-ray today, you will receive an invoice from [facility]. Please contact [facility] at [phone] with questions or concerns regarding your invoice.   IF you received labwork today, you will receive an invoice from [employer]. Please contact [facility] at [phone] with questions or concerns regarding your invoice.   Our billing staff will not be able to assist you with questions regarding bills from these  companies.  You will be contacted with the lab results as soon as they are available. The fastest way to get your results is to activate your My Chart account. Instructions are located on the last page of this paperwork. If you have not heard from [place] regarding the results in 2 weeks, please contact this office.     We recommend that you schedule a mammogram for breast cancer screening. Typically, you do not need a referral to do this. Please contact a local imaging center to schedule your mammogram.  [facility] - [phone]  *ask for the Radiology Department [facility]) - [phone] or [phone]  [facility] - [phone] [facility] - [phone] [facility] - [phone]  *ask for the Radiology Department [facility] - [phone]  *ask for the Radiology Department [facility] - [phone]  *ask for the Mammography Department [facility] - [phone]

## 2016-09-11 NOTE — Progress Notes (Signed)
By signing my name below, I, [name], attest that this documentation has been prepared under the direction and in the presence of Meredith StaggersJeffrey Cherisa Brucker, MD.  Electronically Signed: Arvilla MarketMesha Guinyard, Medical Scribe. 09/11/16. 8:39 AM.  Subjective:    Patient ID: Courtney Blackburn, female    DOB: 1955/11/06, 60 y.o.   MRN: [id number]  HPI Chief Complaint  Patient presents with  . Back Pain    HPI Comments: Courtney Blackburn is a 60 y.o. female who presents to the [facility] complaining of back pain onset 2-3 days ago. Initially, she felt some pain in her lower back which has now radiated up the left side of her back. Pt reports new urinary frequency for the past 2 weeks as well as nausea recently, and chills yesterday. Took BC for her symptoms. Pt has been doing more house chores and suspects her symptoms may have came from exertion. She has not had a new mattress and she's never had back pain similar to this before. Denies recent trauma/injury to her back, or recent UTI.  Patient Active Problem List   Diagnosis Date Noted  . Essential hypertension 02/14/2016  . Depression 02/14/2016   Past Medical History:  Diagnosis Date  . Acid reflux   . Allergy   . Anxiety   . Depression   . Hiatal hernia   . Hypertension    Past Surgical History:  Procedure Laterality Date  . APPENDECTOMY    . TUBAL LIGATION    . WISDOM TOOTH EXTRACTION     Allergies  Allergen Reactions  . Codeine Other (See Comments)    headaches   Prior to Admission medications   Medication Sig Start Date End Date Taking? Authorizing Provider  DULoxetine (CYMBALTA) 60 MG capsule Take 1 capsule (60 mg total) by mouth daily. Reported on 02/14/2016 02/14/16  Yes Elvina SidleKurt Lauenstein, MD  losartan-hydrochlorothiazide [facility](HYZAAR) 100-12.5 MG tablet Take 1 tablet by mouth daily. 02/14/16  Yes Elvina SidleKurt Lauenstein, MD   Social History   Social History  . Marital status: Divorced    Spouse name: N/A  . Number  of children: N/A  . Years of education: N/A   Occupational History  . Not on file.   Social History Main Topics  . Smoking status: Current Every Day Smoker    Packs/day: 1.00    Years: 43.00    Types: Cigarettes  . Smokeless tobacco: Never Used  . Alcohol use No  . Drug use: No  . Sexual activity: Not Currently    Birth control/ protection: Surgical   Other Topics Concern  . Not on file   Social History Narrative  . No narrative on file   Review of Systems  Constitutional: Positive for chills.  Gastrointestinal: Positive for nausea.  Genitourinary: Positive for frequency.  Musculoskeletal: Positive for back pain.   Objective:  Physical Exam  Constitutional: She appears well-developed and well-nourished. No distress.  HENT:  Head: Normocephalic and atraumatic.  Eyes: Conjunctivae are normal.  Neck: Neck supple.  Cardiovascular: Normal rate, regular rhythm and normal heart sounds.  Exam reveals no gallop and no friction rub.   No murmur heard. Pulmonary/Chest: Effort normal and breath sounds normal. No respiratory distress. She has no wheezes. She has no rales.  Abdominal: Soft. She exhibits no distension. There is no tenderness. There is no CVA tenderness.  Some urgency feeling with suprapubic palpation  Musculoskeletal:  Unable to reproduce pain with palpation Discomfort with L>R lateral flexion discomfort Rotation intact  Flexion and extension intact  Neurological: She is alert. She displays no Babinski's sign on the right side. She displays no Babinski's sign on the left side.  Reflex Scores:      Patellar reflexes are 2+ on the right side and 2+ on the left side.      Achilles reflexes are 2+ on the right side and 2+ on the left side. Able to heel and toe walk without difficulty  Skin: Skin is warm and dry.  Psychiatric: She has a normal mood and affect. Her behavior is normal.  Nursing note and vitals reviewed.  BP 130/80 (BP Location: Right Arm, Patient  Position: Sitting, Cuff Size: Normal)   Pulse 92   Temp 99.3 F (37.4 C) (Oral)   Resp 17   Ht 5\' 3"  (1.6 m)   Wt 170 lb (77.1 kg)   LMP 10/02/2012   SpO2 94%   BMI 30.11 kg/m    Results for orders placed or performed in visit on 09/11/16  POCT CBC  Result Value Ref Range   WBC 9.9 4.6 - 10.2 K/uL   Lymph, poc 2.1 0.6 - 3.4   POC LYMPH PERCENT 21.2 10 - 50 %L   MID (cbc) 0.8 0 - 0.9   POC MID % 8.2 0 - 12 %M   POC Granulocyte 7.0 (A) 2 - 6.9   Granulocyte percent 70.6 37 - 80 %G   RBC 3.83 (A) 4.04 - 5.48 M/uL   Hemoglobin 13.0 12.2 - 16.2 g/dL   HCT, POC 16.137.1 (A) 09.637.7 - 47.9 %   MCV 97.0 80 - 97 fL   MCH, POC 33.9 (A) 27 - 31.2 pg   MCHC 35.0 31.8 - 35.4 g/dL   RDW, POC 04.513.7 %   Platelet Count, POC 237 142 - 424 K/uL   MPV 7.5 0 - 99.8 fL  POCT Microscopic Urinalysis ([facility])  Result Value Ref Range   WBC,UR,HPF,POC None None WBC/hpf   RBC,UR,HPF,POC None None RBC/hpf   Bacteria None None, Too numerous to count   Mucus Absent Absent   Epithelial Cells, UR Per Microscopy None None, Too numerous to count cells/hpf  POCT urinalysis dipstick  Result Value Ref Range   Color, UA yellow yellow   Clarity, UA clear clear   Glucose, UA negative negative   Bilirubin, UA negative negative   Ketones, POC UA trace (5) (A) negative   Spec Grav, UA 1.015    Blood, UA negative negative   pH, UA 6.0    Protein Ur, POC negative negative   Urobilinogen, UA 1.0    Nitrite, UA Negative Negative   Leukocytes, UA Negative Negative   Assessment & Plan:    Courtney FlowSandra J Bond is a 60 y.o. female Acute left-sided low back pain without sciatica - Plan: POCT Microscopic Urinalysis ([facility]), POCT urinalysis dipstick, cyclobenzaprine (FLEXERIL) 5 MG tablet  Urinary frequency - Plan: POCT Microscopic Urinalysis ([facility]), POCT urinalysis dipstick  Chills - Plan: POCT CBC  Nausea without vomiting - Plan: POCT CBC  Suspected low back strain/overuse with history of increased housework, and  had been helping with bath time for 60-year-old few nights in a row. No red flags on exam or history, imaging deferred at present. Reported chills and urinary frequency, but really urinalysis reassuring in office as well as CBC.  -Ibuprofen or Aleve over-the-counter, Flexeril if needed up to every 8 hours, side effects discussed, other symptomatic care discussed, as well as handout with home exercise program, and RTC precautions  given.  Meds ordered this encounter  Medications  . cyclobenzaprine (FLEXERIL) 5 MG tablet    Sig: 1 pill by mouth up to every 8 hours as needed. Start with one pill by mouth each bedtime as needed due to sedation    Dispense:  15 tablet    Refill:  0   Patient Instructions    The urine test does not indicate infection today. I suspect you have a muscle strain of your low back that may be related to change in activity. Over-the-counter ibuprofen or Aleve, and I sent in a muscle relaxant that can be taken up to every 8 hours if needed. That does cause sedation, cyst or without a bedtime initially. See other information on low back strain and exercises you can perform at home. If you are not improving within the next week to 10 days or any worsening sooner, return for recheck and possible x-rays.  If nausea persists, or any fevers, return sooner. Return to the clinic or go to the nearest emergency room if any of your symptoms worsen or new symptoms occur.   Back Pain, Adult Back pain is very common in adults.The cause of back pain is rarely dangerous and the pain often gets better over time.The cause of your back pain may not be known. Some common causes of back pain include:  Strain of the muscles or ligaments supporting the spine.  Wear and tear (degeneration) of the spinal disks.  Arthritis.  Direct injury to the back. For many people, back pain may return. Since back pain is rarely dangerous, most people can learn to manage this condition on their own. Follow  these instructions at home: Watch your back pain for any changes. The following actions may help to lessen any discomfort you are feeling:  Remain active. It is stressful on your back to sit or stand in one place for long periods of time. Do not sit, drive, or stand in one place for more than 30 minutes at a time. Take short walks on even surfaces as soon as you are able.Try to increase the length of time you walk each day.  Exercise regularly as directed by your health care provider. Exercise helps your back heal faster. It also helps avoid future injury by keeping your muscles strong and flexible.  Do not stay in bed.Resting more than 1-2 days can delay your recovery.  Pay attention to your body when you bend and lift. The most comfortable positions are those that put less stress on your recovering back. Always use proper lifting techniques, including:  Bending your knees.  Keeping the load close to your body.  Avoiding twisting.  Find a comfortable position to sleep. Use a firm mattress and lie on your side with your knees slightly bent. If you lie on your back, put a pillow under your knees.  Avoid feeling anxious or stressed.Stress increases muscle tension and can worsen back pain.It is important to recognize when you are anxious or stressed and learn ways to manage it, such as with exercise.  Take medicines only as directed by your health care provider. Over-the-counter medicines to reduce pain and inflammation are often the most helpful.Your health care provider may prescribe muscle relaxant drugs.These medicines help dull your pain so you can more quickly return to your normal activities and healthy exercise.  Apply ice to the injured area:  Put ice in a plastic bag.  Place a towel between your skin and the bag.  Leave the ice  on for 20 minutes, 2-3 times a day for the first 2-3 days. After that, ice and heat may be alternated to reduce pain and spasms.  Maintain a  healthy weight. Excess weight puts extra stress on your back and makes it difficult to maintain good posture. Contact a health care provider if:  You have pain that is not relieved with rest or medicine.  You have increasing pain going down into the legs or buttocks.  You have pain that does not improve in one week.  You have night pain.  You lose weight.  You have a fever or chills. Get help right away if:  You develop new bowel or bladder control problems.  You have unusual weakness or numbness in your arms or legs.  You develop nausea or vomiting.  You develop abdominal pain.  You feel faint. This information is not intended to replace advice given to you by your health care provider. Make sure you discuss any questions you have with your health care provider. Document Released: 10/15/2005 Document Revised: 02/23/2016 Document Reviewed: 02/16/2014 Elsevier Interactive Patient Education  2017 Elsevier Inc.  Low Back Strain Rehab Ask your health care provider which exercises are safe for you. Do exercises exactly as told by your health care provider and adjust them as directed. It is normal to feel mild stretching, pulling, tightness, or discomfort as you do these exercises, but you should stop right away if you feel sudden pain or your pain gets worse. Do not begin these exercises until told by your health care provider. Stretching and range of motion exercises These exercises warm up your muscles and joints and improve the movement and flexibility of your back. These exercises also help to relieve pain, numbness, and tingling. Exercise A: Single knee to chest 1. Lie on your back on a firm surface with both legs straight. 2. Bend one of your knees. Use your hands to move your knee up toward your chest until you feel a gentle stretch in your lower back and buttock.  Hold your leg in this position by holding onto the front of your knee.  Keep your other leg as straight as  possible. 3. Hold for __________ seconds. 4. Slowly return to the starting position. 5. Repeat with your other leg. Repeat __________ times. Complete this exercise __________ times a day. Exercise B: Prone extension on elbows 1. Lie on your abdomen on a firm surface. 2. Prop yourself up on your elbows. 3. Use your arms to help lift your chest up until you feel a gentle stretch in your abdomen and your lower back.  This will place some of your body weight on your elbows. If this is uncomfortable, try stacking pillows under your chest.  Your hips should stay down, against the surface that you are lying on. Keep your hip and back muscles relaxed. 4. Hold for __________ seconds. 5. Slowly relax your upper body and return to the starting position. Repeat __________ times. Complete this exercise __________ times a day. Strengthening exercises These exercises build strength and endurance in your back. Endurance is the ability to use your muscles for a long time, even after they get tired. Exercise C: Pelvic tilt 1. Lie on your back on a firm surface. Bend your knees and keep your feet flat. 2. Tense your abdominal muscles. Tip your pelvis up toward the ceiling and flatten your lower back into the floor.  To help with this exercise, you may place a small towel under your lower  back and try to push your back into the towel. 3. Hold for __________ seconds. 4. Let your muscles relax completely before you repeat this exercise. Repeat __________ times. Complete this exercise __________ times a day. Exercise D: Alternating arm and leg raises 1. Get on your hands and knees on a firm surface. If you are on a hard floor, you may want to use padding to cushion your knees, such as an exercise mat. 2. Line up your arms and legs. Your hands should be below your shoulders, and your knees should be below your hips. 3. Lift your left leg behind you. At the same time, raise your right arm and straighten it in  front of you.  Do not lift your leg higher than your hip.  Do not lift your arm higher than your shoulder.  Keep your abdominal and back muscles tight.  Keep your hips facing the ground.  Do not arch your back.  Keep your balance carefully, and do not hold your breath. 4. Hold for __________ seconds. 5. Slowly return to the starting position and repeat with your right leg and your left arm. Repeat __________ times. Complete this exercise __________times a day. Exercise J: Single leg lower with bent knees 1. Lie on your back on a firm surface. 2. Tense your abdominal muscles and lift your feet off the floor, one foot at a time, so your knees and hips are bent in an "L" shape (at about 90 degrees).  Your knees should be over your hips and your lower legs should be parallel to the floor. 3. Keeping your abdominal muscles tense and your knee bent, slowly lower one of your legs so your toe touches the ground. 4. Lift your leg back up to return to the starting position.  Do not hold your breath.  Do not let your back arch. Keep your back flat against the ground. 5. Repeat with your other leg. Repeat __________ times. Complete this exercise __________ times a day. Posture and body mechanics   Body mechanics refers to the movements and positions of your body while you do your daily activities. Posture is part of body mechanics. Good posture and healthy body mechanics can help to relieve stress in your body's tissues and joints. Good posture means that your spine is in its natural S-curve position (your spine is neutral), your shoulders are pulled back slightly, and your head is not tipped forward. The following are general guidelines for applying improved posture and body mechanics to your everyday activities. Standing   When standing, keep your spine neutral and your feet about hip-width apart. Keep a slight bend in your knees. Your ears, shoulders, and hips should line up.  When you do  a task in which you stand in one place for a long time, place one foot up on a stable object that is 2-4 inches (5-10 cm) high, such as a footstool. This helps keep your spine neutral. Sitting  When sitting, keep your spine neutral and keep your feet flat on the floor. Use a footrest, if necessary, and keep your thighs parallel to the floor. Avoid rounding your shoulders, and avoid tilting your head forward.  When working at a desk or a computer, keep your desk at a height where your hands are slightly lower than your elbows. Slide your chair under your desk so you are close enough to maintain good posture.  When working at a computer, place your monitor at a height where you are looking straight  ahead and you do not have to tilt your head forward or downward to look at the screen. Resting   When lying down and resting, avoid positions that are most painful for you.  If you have pain with activities such as sitting, bending, stooping, or squatting (flexion-based activities), lie in a position in which your body does not bend very much. For example, avoid curling up on your side with your arms and knees near your chest (fetal position).  If you have pain with activities such as standing for a long time or reaching with your arms (extension-based activities), lie with your spine in a neutral position and bend your knees slightly. Try the following positions:  Lying on your side with a pillow between your knees.  Lying on your back with a pillow under your knees. Lifting   When lifting objects, keep your feet at least shoulder-width apart and tighten your abdominal muscles.  Bend your knees and hips and keep your spine neutral. It is important to lift using the strength of your legs, not your back. Do not lock your knees straight out.  Always ask for help to lift heavy or awkward objects. This information is not intended to replace advice given to you by your health care provider. Make sure  you discuss any questions you have with your health care provider. Document Released: 10/15/2005 Document Revised: 06/21/2016 Document Reviewed: 07/27/2015 Elsevier Interactive Patient Education  2017 [employer].    IF you received an x-ray today, you will receive an invoice from [facility]. Please contact [facility] at [phone] with questions or concerns regarding your invoice.   IF you received labwork today, you will receive an invoice from [employer]. Please contact [facility] at [phone] with questions or concerns regarding your invoice.   Our billing staff will not be able to assist you with questions regarding bills from these companies.  You will be contacted with the lab results as soon as they are available. The fastest way to get your results is to activate your My Chart account. Instructions are located on the last page of this paperwork. If you have not heard from [place] regarding the results in 2 weeks, please contact this office.     We recommend that you schedule a mammogram for breast cancer screening. Typically, you do not need a referral to do this. Please contact a local imaging center to schedule your mammogram.  [facility] - [phone]  *ask for the Radiology Department [facility]) - [phone] or [phone]  [facility] - [phone] [facility] - [phone] [facility] - [phone]  *ask for the Radiology Department [facility] - [phone]  *ask for the Radiology Department [facility] - [phone]  *ask for the Mammography Department [facility] - [phone]    I personally performed the services described in this documentation, which was scribed in my presence. The recorded information has been reviewed and considered, and addended by me as needed.   Signed,   Meredith Staggers,  MD [facility].  09/11/16 10:12 AM

## 2017-01-09 ENCOUNTER — Ambulatory Visit: Payer: BLUE CROSS/BLUE SHIELD

## 2017-01-10 ENCOUNTER — Ambulatory Visit (INDEPENDENT_AMBULATORY_CARE_PROVIDER_SITE_OTHER): Payer: Self-pay | Admitting: Physician Assistant

## 2017-01-10 VITALS — BP 124/78 | HR 82 | Temp 98.4°F | Resp 16 | Ht 63.0 in | Wt 172.0 lb

## 2017-01-10 DIAGNOSIS — F32A Depression, unspecified: Secondary | ICD-10-CM

## 2017-01-10 DIAGNOSIS — F329 Major depressive disorder, single episode, unspecified: Secondary | ICD-10-CM

## 2017-01-10 DIAGNOSIS — J9801 Acute bronchospasm: Secondary | ICD-10-CM

## 2017-01-10 LAB — POCT CBC
Granulocyte percent: 59.8 %G (ref 37–80)
HEMATOCRIT: 35.1 % — AB (ref 37.7–47.9)
HEMOGLOBIN: 12 g/dL — AB (ref 12.2–16.2)
LYMPH, POC: 2.7 (ref 0.6–3.4)
MCH, POC: 33.1 pg — AB (ref 27–31.2)
MCHC: 34.1 g/dL (ref 31.8–35.4)
MCV: 97 fL (ref 80–97)
MID (cbc): 0.7 (ref 0–0.9)
MPV: 7.7 fL (ref 0–99.8)
POC GRANULOCYTE: 5.1 (ref 2–6.9)
POC LYMPH PERCENT: 31.6 %L (ref 10–50)
POC MID %: 8.6 %M (ref 0–12)
Platelet Count, POC: 272 10*3/uL (ref 142–424)
RBC: 3.61 M/uL — AB (ref 4.04–5.48)
RDW, POC: 13.7 %
WBC: 8.5 10*3/uL (ref 4.6–10.2)

## 2017-01-10 MED ORDER — PROMETHAZINE-DM 6.25-15 MG/5ML PO SYRP: 5.0000 mL | ORAL_SOLUTION | Freq: Four times a day (QID) | ORAL | 0 refills | Status: DC | PRN

## 2017-01-10 MED ORDER — ALBUTEROL SULFATE HFA 108 (90 BASE) MCG/ACT IN AERS
2.0000 | INHALATION_SPRAY | RESPIRATORY_TRACT | 1 refills | Status: DC | PRN
Start: 2017-01-10 — End: 2017-02-11

## 2017-01-10 MED ORDER — BUSPIRONE HCL 7.5 MG PO TABS: 7.5000 mg | ORAL_TABLET | Freq: Two times a day (BID) | ORAL | 2 refills | Status: DC

## 2017-01-10 MED ORDER — BENZONATATE 100 MG PO CAPS: 100.0000 mg | ORAL_CAPSULE | Freq: Three times a day (TID) | ORAL | 0 refills | Status: DC | PRN

## 2017-01-10 NOTE — Patient Instructions (Addendum)
Please return in 4 weeks to let me know how the additional medication is doing. I would like you to take the medication as prescribed.    Bronchospasm, Adult Bronchospasm is when airways in the lungs get smaller. When this happens, it can be hard to breathe. You may cough. You may also make a whistling sound when you breathe (wheeze). Follow these instructions at home: Medicines   Take over-the-counter and prescription medicines only as told by your doctor.  If you need to use an inhaler or nebulizer to take your medicine, ask your doctor how to use it.  If you were given a spacer, always use it with your inhaler. Lifestyle   Change your heating and air conditioning filter. Do this at least once a month.  Try not to use fireplaces and wood stoves.  Do not  smoke. Do not  allow smoking in your home.  Try not to use things that have a strong smell, like perfume.  Get rid of pests (such as roaches and mice) and their poop.  Remove any mold from your home.  Keep your house clean. Get rid of dust.  Use cleaning products that have no smell.  Replace carpet with wood, tile, or vinyl flooring.  Use allergy-proof pillows, mattress covers, and box spring covers.  Wash bed sheets and blankets every week. Use hot water. Dry them in a dryer.  Use blankets that are made of polyester or cotton.  Wash your hands often.  Keep pets out of your bedroom.  When you exercise, try not to breathe in cold air. General instructions   Have a plan for getting medical care. Know these things:  When to call your doctor.  When to call local emergency services (911 in the [place]  Where to go in an emergency.  Stay up to date on your shots (immunizations).  When you have an episode:  Stay calm.  Relax.  Breathe slowly. Contact a doctor if:  Your muscles ache.  Your chest hurts.  The color of the mucus you cough up (sputum) changes from clear or white to yellow, green, gray, or  bloody.  The mucus you cough up gets thicker.  You have a fever. Get help right away if:  The whistling sound gets worse, even after you take your medicines.  Your coughing gets worse.  You find it even harder to breathe.  Your chest hurts very much. Summary  Bronchospasm is when airways in the lungs get smaller.  When this happens, it can be hard to breathe. You may cough. You may also make a whistling sound when you breathe.  Stay away from things that cause you to have episodes. These include smoke or dust. This information is not intended to replace advice given to you by your health care provider. Make sure you discuss any questions you have with your health care provider. Document Released: 08/12/2009 Document Revised: 10/18/2016 Document Reviewed: 10/18/2016 Elsevier Interactive Patient Education  2017 [employer]    IF you received an x-ray today, you will receive an invoice from [facility]. Please contact [facility] at [phone] with questions or concerns regarding your invoice.   IF you received labwork today, you will receive an invoice from [facility]. Please contact [facility] at [phone] with questions or concerns regarding your invoice.   Our billing staff will not be able to assist you with questions regarding bills from these companies.  You will be contacted with the lab results as soon as they  are available. The fastest way to get your results is to activate your My Chart account. Instructions are located on the last page of this paperwork. If you have not heard from [place] regarding the results in 2 weeks, please contact this office.

## 2017-01-10 NOTE — Progress Notes (Signed)
PRIMARY CARE AT [facility] [address], [place] [address] [phone]  Date:  01/10/2017   Name:  Courtney Blackburn   DOB:  [birth year]   MRN:  [id number]  PCP:  No PCP Per Patient    History of Present Illness:  Courtney Blackburn is a 61 y.o. female patient who presents to PCP with  Chief Complaint  Patient presents with  . Cough    With chest congestion/pain  . Medication Problem    Wanted to increase Cymbalta but has been noticing weight gain since starting medication     Symptoms started 5 days ago, with chest tightness, and nasal congestion.  She has a bad taste in mouth.  Fatigued.  Yellow and green sputum cough mildly.  Daughter states that she is wheezing.  She perhaps had a low grade fever, but they do not remember the temperature.  She is coughing to the point of urination and stool.   She has sob and dyspnea, generally following a attack.  She feels winded easily.   Smoking currently 1ppd.  Smoked less during the illness.   She would also like to increase her Cymbalta if possible.  She states that this helps her mood.  She is less fatigued and sociable.  She continues to feel kind of dull and "depressed".  No SI/HI.    Wt Readings from Last 3 Encounters:  01/10/17 172 lb (78 kg)  09/11/16 170 lb (77.1 kg)  02/14/16 169 lb (76.7 kg)     Patient Active Problem List   Diagnosis Date Noted  . Essential hypertension 02/14/2016  . Depression 02/14/2016    Past Medical History:  Diagnosis Date  . Acid reflux   . Allergy   . Anxiety   . Depression   . Hiatal hernia   . Hypertension     Past Surgical History:  Procedure Laterality Date  . APPENDECTOMY    . TUBAL LIGATION    . WISDOM TOOTH EXTRACTION      Social History  Substance Use Topics  . Smoking status: Current Every Day Smoker    Packs/day: 1.00    Years: 43.00    Types: Cigarettes  . Smokeless tobacco: Never Used  . Alcohol use No    Family History  Problem Relation Age of Onset  .  Heart disease Mother   . Heart disease Father   . Cancer Sister   . Cancer Brother     Allergies  Allergen Reactions  . Codeine Other (See Comments)    headaches    Medication list has been reviewed and updated.  Current Outpatient Prescriptions on File Prior to Visit  Medication Sig Dispense Refill  . DULoxetine (CYMBALTA) 60 MG capsule Take 1 capsule (60 mg total) by mouth daily. Reported on 02/14/2016 30 capsule 11  . losartan-hydrochlorothiazide (HYZAAR) 100-12.5 MG tablet Take 1 tablet by mouth daily. 90 tablet 3  . cyclobenzaprine (FLEXERIL) 5 MG tablet 1 pill by mouth up to every 8 hours as needed. Start with one pill by mouth each bedtime as needed due to sedation (Patient not taking: Reported on 01/10/2017) 15 tablet 0   No current facility-administered medications on file prior to visit.     ROS ROS otherwise unremarkable unless listed above.  Physical Examination: BP 124/78   Pulse 82   Temp 98.4 F (36.9 C) (Oral)   Resp 16   Ht 5\' 3"  (1.6 m)   Wt 172 lb (78 kg)   LMP 10/02/2012  SpO2 94%   BMI 30.47 kg/m  Ideal Body Weight: Weight in (lb) to have BMI = 25: 140.8  Physical Exam  Constitutional: She is oriented to person, place, and time. She appears well-developed and well-nourished. No distress.  HENT:  Head: Normocephalic and atraumatic.  Right Ear: Tympanic membrane, external ear and ear canal normal.  Left Ear: Tympanic membrane, external ear and ear canal normal.  Nose: Mucosal edema and rhinorrhea present. Right sinus exhibits no maxillary sinus tenderness and no frontal sinus tenderness. Left sinus exhibits no maxillary sinus tenderness and no frontal sinus tenderness.  Mouth/Throat: No uvula swelling. No oropharyngeal exudate, posterior oropharyngeal edema or posterior oropharyngeal erythema.  Eyes: Conjunctivae and EOM are normal. Pupils are equal, round, and reactive to light.  Cardiovascular: Normal rate and regular rhythm.  Exam reveals no  gallop, no distant heart sounds and no friction rub.   No murmur heard. Pulmonary/Chest: Effort normal. No respiratory distress. She has no decreased breath sounds. She has no wheezes. She has no rhonchi.  Lymphadenopathy:       Head (right side): No submandibular, no tonsillar, no preauricular and no posterior auricular adenopathy present.       Head (left side): No submandibular, no tonsillar, no preauricular and no posterior auricular adenopathy present.    She has no cervical adenopathy.  Neurological: She is alert and oriented to person, place, and time.  Skin: Skin is warm and dry. Capillary refill takes less than 2 seconds. She is not diaphoretic.  Psychiatric: She has a normal mood and affect. Her behavior is normal.    Results for orders placed or performed in visit on 01/10/17  POCT CBC  Result Value Ref Range   WBC 8.5 4.6 - 10.2 K/uL   Lymph, poc 2.7 0.6 - 3.4   POC LYMPH PERCENT 31.6 10 - 50 %L   MID (cbc) 0.7 0 - 0.9   POC MID % 8.6 0 - 12 %M   POC Granulocyte 5.1 2 - 6.9   Granulocyte percent 59.8 37 - 80 %G   RBC 3.61 (A) 4.04 - 5.48 M/uL   Hemoglobin 12.0 (A) 12.2 - 16.2 g/dL   HCT, POC 91.435.1 (A) 78.237.7 - 47.9 %   MCV 97.0 80 - 97 fL   MCH, POC 33.1 (A) 27 - 31.2 pg   MCHC 34.1 31.8 - 35.4 g/dL   RDW, POC 95.613.7 %   Platelet Count, POC 272 142 - 424 K/uL   MPV 7.7 0 - 99.8 fL    Assessment and Plan: Courtney Blackburn is a 61 y.o. female who is here today for cough, and medication change. We will keep the Cymbalta rather than taper or change ssri.  We will augment this with Buspar twice per day.   Advised to return in 4 weeks for recheck. Respiratory sxs appear likely viral respiratory causing bronchospasms.  Discussed the increasing complications of her continuous smoking.  She is not ready to quit at this time. Bronchospasm - Plan: POCT CBC, busPIRone (BUSPAR) 7.5 MG tablet, albuterol (PROVENTIL HFA;VENTOLIN HFA) 108 (90 Base) MCG/ACT inhaler, benzonatate  (TESSALON) 100 MG capsule, promethazine-dextromethorphan (PROMETHAZINE-DM) 6.25-15 MG/5ML syrup  Depression, unspecified depression type - Plan: busPIRone (BUSPAR) 7.5 MG tablet  Trena PlattStephanie Dariusz Brase, PA-C [facility] 3/15/20182:10 PM

## 2017-01-14 ENCOUNTER — Telehealth: Payer: Self-pay | Admitting: Physician Assistant

## 2017-01-14 NOTE — Telephone Encounter (Signed)
Left message to return message 

## 2017-01-14 NOTE — Telephone Encounter (Signed)
Pt returning Jills message

## 2017-01-14 NOTE — Telephone Encounter (Signed)
Spoke with patient- States,after using Albuterol HFA developed sx's anxiety and feeling jittery. Advised, sx's are normal with first time use of steroids. Advised to call office if sx's worsen with heart palpitations, feeling flushed or diaphoretic noted. Verbalized understanding

## 2017-01-14 NOTE — Telephone Encounter (Addendum)
PATIENT WAS SEEN Thursday BY STEPHANIE ENGLISH FOR INFECTION IN HER LUNGS AND BRONCHOSPASM. STEPHANIE GAVE HER THESE 2 NEWEST MEDICATIONS WHICH ARE BUSPIRONE 7.5 MG AND ALBUTEROL (PROVENTIL) HFA. SHE THINKS ONE OF THESE MEDICINES ARE CAUSING HER TO BE VERY SHAKY AND MAKING HER ANXIETY WORSE FOR THE LAST COUPLE OF DAYS. COULD THIS BE A SIDE EFFECT OF ONE OF THEM? BEST PHONE [phone] (CELL) PHARMACY CHOICE IS [facility] ON [address]. MBC

## 2017-02-09 ENCOUNTER — Ambulatory Visit: Payer: Self-pay

## 2017-02-11 ENCOUNTER — Ambulatory Visit (INDEPENDENT_AMBULATORY_CARE_PROVIDER_SITE_OTHER): Payer: Self-pay | Admitting: Family Medicine

## 2017-02-11 ENCOUNTER — Encounter: Payer: Self-pay | Admitting: Family Medicine

## 2017-02-11 VITALS — BP 171/81 | HR 81 | Temp 98.3°F | Resp 18 | Ht 63.0 in | Wt 168.0 lb

## 2017-02-11 DIAGNOSIS — I10 Essential (primary) hypertension: Secondary | ICD-10-CM

## 2017-02-11 DIAGNOSIS — J209 Acute bronchitis, unspecified: Secondary | ICD-10-CM

## 2017-02-11 DIAGNOSIS — J9801 Acute bronchospasm: Secondary | ICD-10-CM

## 2017-02-11 DIAGNOSIS — Z720 Tobacco use: Secondary | ICD-10-CM

## 2017-02-11 DIAGNOSIS — J42 Unspecified chronic bronchitis: Secondary | ICD-10-CM

## 2017-02-11 MED ORDER — PROMETHAZINE-CODEINE 6.25-10 MG/5ML PO SYRP: 5.0000 mL | ORAL_SOLUTION | Freq: Four times a day (QID) | ORAL | 0 refills | Status: DC | PRN

## 2017-02-11 MED ORDER — PREDNISONE 20 MG PO TABS: 40.0000 mg | ORAL_TABLET | Freq: Every day | ORAL | 0 refills | Status: DC

## 2017-02-11 MED ORDER — ALBUTEROL SULFATE HFA 108 (90 BASE) MCG/ACT IN AERS: 2.0000 | INHALATION_SPRAY | RESPIRATORY_TRACT | 1 refills | Status: DC | PRN

## 2017-02-11 MED ORDER — AZITHROMYCIN 250 MG PO TABS: ORAL_TABLET | ORAL | 0 refills | Status: DC

## 2017-02-11 NOTE — Progress Notes (Signed)
Subjective:    Patient ID: Courtney Blackburn, female    DOB: 1956/01/29, 61 y.o.   MRN: [id number] Chief Complaint  Patient presents with  . Follow-up    bronchospasm/ pt states that her cough comes and goes/ dry cough, meds didnt work  . cramps    pt states she has cramps all over body    HPI  Pt was seen 1 mo prior for similar chest congestion.  Suspected to be viral at that time and advised to decrease smoking though pt was not willing to stop.  She states she never improved but has been worsening over the past several weeks and had to miss work occasionally for this. Not coughing more but does feel like she has chest congsetion though has not been able to mobilize this. Does have sinus and nasal congestion. Sxs worsened acutely over the past 4d and she has been out of work this whole time. Altnerates between sweats and chills.  Throat scratchy, no h/o seasonal allergies. Has been using mucinex without improvement. The albuterol inhaler helped but not the tessalon pearles or the promethazine-DM - all are out.  1 Ppd smoker  CXR 15 mos prior shows acute on chronic bronchitis  Does not have health insurance. Does come here for her primary care.  She has been more emotionally labile.   HTN: 129/60s at home though not checked lately.  Past Medical History:  Diagnosis Date  . Acid reflux   . Allergy   . Anxiety   . Depression   . Hiatal hernia   . Hypertension    Past Surgical History:  Procedure Laterality Date  . APPENDECTOMY    . TUBAL LIGATION    . WISDOM TOOTH EXTRACTION     Current Outpatient Prescriptions on File Prior to Visit  Medication Sig Dispense Refill  . losartan-hydrochlorothiazide (HYZAAR) 100-12.5 MG tablet Take 1 tablet by mouth daily. 90 tablet 3   No current facility-administered medications on file prior to visit.    Allergies  Allergen Reactions  . Codeine Other (See Comments)    headaches   Family History  Problem Relation Age of Onset    . Heart disease Mother   . Heart disease Father   . Cancer Sister   . Cancer Brother    Social History   Social History  . Marital status: Divorced    Spouse name: N/A  . Number of children: N/A  . Years of education: N/A   Social History Main Topics  . Smoking status: Current Every Day Smoker    Packs/day: 1.00    Years: 43.00    Types: Cigarettes  . Smokeless tobacco: Never Used  . Alcohol use No  . Drug use: No  . Sexual activity: Not Currently    Birth control/ protection: Surgical   Other Topics Concern  . None   Social History Narrative  . None   Depression screen [facility] 2/9 02/11/2017 09/11/2016 02/14/2016 02/14/2016  Decreased Interest 0 0 3 0  Down, Depressed, Hopeless 0 0 3 0  PHQ - 2 Score 0 0 6 0  Altered sleeping - - 1 -  Tired, decreased energy - - 3 -  Change in appetite - - 3 -  Feeling bad or failure about yourself  - - 1 -  Trouble concentrating - - 3 -  Moving slowly or fidgety/restless - - 0 -  Suicidal thoughts - - 0 -  PHQ-9 Score - - 17 -  Difficult doing  work/chores - - Extremely dIfficult -    Review of Systems See hpi    Objective:   Physical Exam  Constitutional: She is oriented to person, place, and time. She appears well-developed and well-nourished. No distress.  HENT:  Head: Normocephalic and atraumatic.  Right Ear: External ear normal.  Left Ear: External ear normal.  Nose: Nose normal.  Eyes: Conjunctivae are normal. Right eye exhibits no discharge. Left eye exhibits no discharge. No scleral icterus.  Neck: Neck supple. No thyromegaly present.  Cardiovascular: Normal rate, regular rhythm and normal heart sounds.   Pulmonary/Chest: Effort normal. No accessory muscle usage. No respiratory distress. She has no decreased breath sounds. She has wheezes in the right lower field, the left upper field and the left lower field. She has rhonchi in the right lower field and the left lower field. She has no rales.  Not wearing oxygen. Talks  in complete sentences and walks through clinic w/o apparent dyspnea.  Musculoskeletal: She exhibits no edema or tenderness.  Lymphadenopathy:    She has no cervical adenopathy.  Neurological: She is alert and oriented to person, place, and time.  Skin: Skin is warm and dry. She is not diaphoretic. No erythema.  Psychiatric: She has a normal mood and affect. Her behavior is normal.     Room smells strongly of cigarette smoke    BP (!) 171/81   Pulse 81   Temp 98.3 F (36.8 C) (Oral)   Resp 18   Ht  (1.6 m)   Wt 168 lb (76.2 kg)   LMP 10/02/2012   SpO2 96%   BMI 29.76 kg/m   Assessment & Plan:   1. Acute exacerbation of chronic bronchitis (HCC)   2. Essential hypertension - checks outside office and near goal  3. Bronchospasm   4. Tobacco abuse - encouraged cessation - pt contemplative. Pt w/o health insurance so zyban/chantix cost-prohibitive    oow from tomorrow Fri through the following Sun (9d) 4/22 -works at [employer] ordered this encounter  Medications  . predniSONE (DELTASONE) 20 MG tablet    Sig: Take 2 tablets (40 mg total) by mouth daily with breakfast.    Dispense:  10 tablet    Refill:  0  . albuterol (PROVENTIL HFA;VENTOLIN HFA) 108 (90 Base) MCG/ACT inhaler    Sig: Inhale 2 puffs into the lungs every 4 (four) hours as needed for wheezing or shortness of breath (cough, shortness of breath or wheezing.).    Dispense:  1 Inhaler    Refill:  1  . azithromycin (ZITHROMAX) 250 MG tablet    Sig: Take 2 tabs PO x 1 dose, then 1 tab PO QD x 4 days    Dispense:  6 tablet    Refill:  0  . promethazine-codeine (PHENERGAN WITH CODEINE) 6.25-10 MG/5ML syrup    Sig: Take 5-10 mLs by mouth every 6 (six) hours as needed for cough.    Dispense:  120 mL    Refill:  0    Norberto Sorenson, M.D.  Primary Care at [facility] [address] [place], [place] [address] [phone] phone [phone] fax  02/18/17 11:41 PM

## 2017-02-11 NOTE — Patient Instructions (Addendum)
Check your blood pressure several times a week starting in 2 weeks (after the prescribed medication is out of your system) and let me and please call me and let me know what they are running in about 1 month from now so I know what to do with your medication.  If the codeine cough syrup causes a HA, let me now so we can decide whether to leave this on your allergy list or not.  Use www.goodrx.com to get coupons for ALL of your medicines before you fill them.  Take a dose of delsym along with the prescription cough syrup for better cough suppression.   IF you received an x-ray today, you will receive an invoice from [facility]. Please contact [facility] at [phone] with questions or concerns regarding your invoice.   IF you received labwork today, you will receive an invoice from [facility]. Please contact [facility] at [phone] with questions or concerns regarding your invoice.   Our billing staff will not be able to assist you with questions regarding bills from these companies.  You will be contacted with the lab results as soon as they are available. The fastest way to get your results is to activate your My Chart account. Instructions are located on the last page of this paperwork. If you have not heard from [place] regarding the results in 2 weeks, please contact this office.      Chronic Obstructive Pulmonary Disease Exacerbation Chronic obstructive pulmonary disease (COPD) is a common lung condition in which airflow from the lungs is limited. COPD is a general term that can be used to describe many different lung problems that limit airflow, including chronic bronchitis and emphysema. COPD exacerbations are episodes when breathing symptoms become much worse and require extra treatment. Without treatment, COPD exacerbations can be life threatening, and frequent COPD exacerbations can cause further damage to your lungs. What are the causes?  Respiratory  infections.  Exposure to smoke.  Exposure to air pollution, chemical fumes, or dust. Sometimes there is no apparent cause or trigger. What increases the risk?  Smoking cigarettes.  Older age.  Frequent prior COPD exacerbations. What are the signs or symptoms?  Increased coughing.  Increased thick spit (sputum) production.  Increased wheezing.  Increased shortness of breath.  Rapid breathing.  Chest tightness. How is this diagnosed? Your medical history, a physical exam, and tests will help your health care provider make a diagnosis. Tests may include:  A chest X-ray.  Basic lab tests.  Sputum testing.  An arterial blood gas test. How is this treated? Depending on the severity of your COPD exacerbation, you may need to be admitted to a hospital for treatment. Some of the treatments commonly used to treat COPD exacerbations are:  Antibiotic medicines.  Bronchodilators. These are drugs that expand the air passages. They may be given with an inhaler or nebulizer. Spacer devices may be needed to help improve drug delivery.  Corticosteroid medicines.  Supplemental oxygen therapy.  Airway clearing techniques, such as noninvasive ventilation (NIV) and positive expiratory pressure (PEP). These provide respiratory support through a mask or other noninvasive device. Follow these instructions at home:  Do not smoke. Quitting smoking is very important to prevent COPD from getting worse and exacerbations from happening as often.  Avoid exposure to all substances that irritate the airway, especially to tobacco smoke.  If you were prescribed an antibiotic medicine, finish it all even if you start to feel better.  Take all medicines as directed by your  health care provider.It is important to use correct technique with inhaled medicines.  Drink enough fluids to keep your urine clear or pale yellow (unless you have a medical condition that requires fluid restriction).  Use a  cool mist vaporizer. This makes it easier to clear your chest when you cough.  If you have a home nebulizer and oxygen, continue to use them as directed.  Maintain all necessary vaccinations to prevent infections.  Exercise regularly.  Eat a healthy diet.  Keep all follow-up appointments as directed by your health care provider. Get help right away if:  You have worsening shortness of breath.  You have trouble talking.  You have severe chest pain.  You have blood in your sputum.  You have a fever.  You have weakness, vomit repeatedly, or faint.  You feel confused.  You continue to get worse. This information is not intended to replace advice given to you by your health care provider. Make sure you discuss any questions you have with your health care provider. Document Released: 08/12/2007 Document Revised: 03/22/2016 Document Reviewed: 06/19/2013 Elsevier Interactive Patient Education  2017 [employer] Managing Your Hypertension Hypertension is commonly called high blood pressure. This is when the force of your blood pressing against the walls of your arteries is too strong. Arteries are blood vessels that carry blood from your heart throughout your body. Hypertension forces the heart to work harder to pump blood, and may cause the arteries to become narrow or stiff. Having untreated or uncontrolled hypertension can cause heart attack, stroke, kidney disease, and other problems. What are blood pressure readings? A blood pressure reading consists of a higher number over a lower number. Ideally, your blood pressure should be below 120/80. The first ("top") number is called the systolic pressure. It is a measure of the pressure in your arteries as your heart beats. The second ("bottom") number is called the diastolic pressure. It is a measure of the pressure in your arteries as the heart relaxes. What does my blood pressure reading mean? Blood pressure is classified into four  stages. Based on your blood pressure reading, your health care provider may use the following stages to determine what type of treatment you need, if any. Systolic pressure and diastolic pressure are measured in a unit called mm Hg. Normal   Systolic pressure: below 120.  Diastolic pressure: below 80. Elevated   Systolic pressure: 120-129.  Diastolic pressure: below 80. Hypertension stage 1     Diastolic pressure: 80-89. Hypertension stage 2   Systolic pressure: 140 or above.  Diastolic pressure: 90 or above. What health risks are associated with hypertension? Managing your hypertension is an important responsibility. Uncontrolled hypertension can lead to:  A heart attack.  A stroke.  A weakened blood vessel (aneurysm).  Heart failure.  Kidney damage.  Eye damage.  Metabolic syndrome.  Memory and concentration problems. What changes can I make to manage my hypertension? Eating and drinking   Eat a diet that is high in fiber and potassium, and low in salt (sodium), added sugar, and fat. An example eating plan is called the DASH (Dietary Approaches to Stop Hypertension) diet. To eat this way:  Eat plenty of fresh fruits and vegetables. Try to fill half of your plate at each meal with fruits and vegetables.  Eat whole grains, such as whole wheat pasta, brown rice, or whole grain bread. Fill about one quarter of your plate with whole grains.  Eat low-fat diary products.  Avoid fatty cuts  of meat, processed or cured meats, and poultry with skin. Fill about one quarter of your plate with lean proteins such as fish, chicken without skin, beans, eggs, and tofu.  Avoid premade and processed foods. These tend to be higher in sodium, added sugar, and fat.     Lifestyle   Work with your health care provider to maintain a healthy body weight, or to lose weight. Ask what an ideal weight is for you.  Get at least 30 minutes of exercise that causes your heart to beat  faster (aerobic exercise) most days of the week. Activities may include walking, swimming, or biking.       Monitoring   Monitor your blood pressure at home as told by your health care provider. Your personal target blood pressure may vary depending on your medical conditions, your age, and other factors.  Have your blood pressure checked regularly, as often as told by your health care provider. Working with your health care provider   Review all the medicines you take with your health care provider because there may be side effects or interactions.  Talk with your health care provider about your diet, exercise habits, and other lifestyle factors that may be contributing to hypertension.  Visit your health care provider regularly. Your health care provider can help you create and adjust your plan for managing hypertension. Will I need medicine to control my blood pressure? Your health care provider may prescribe medicine if lifestyle changes are not enough to get your blood pressure under control, and if:  Your systolic blood pressure is 130 or higher.  Your diastolic blood pressure is 80 or higher. Take medicines only as told by your health care provider. Follow the directions carefully. Blood pressure medicines must be taken as prescribed. The medicine does not work as well when you skip doses. Skipping doses also puts you at risk for problems. Contact a health care provider if:  You think you are having a reaction to medicines you have taken.  You have repeated (recurrent) headaches.  You feel dizzy.  You have swelling in your ankles.  You have trouble with your vision. Get help right away if:  You develop a severe headache or confusion.  You have unusual weakness or numbness, or you feel faint.  You have severe pain in your chest or abdomen.  You vomit repeatedly.  You have trouble breathing. Summary  Hypertension is when the force of blood pumping through your  arteries is too strong. If this condition is not controlled, it may put you at risk for serious complications.  Your personal target blood pressure may vary depending on your medical conditions, your age, and other factors. For most people, a normal blood pressure is less than 120/80.  Hypertension is managed by lifestyle changes, medicines, or both. Lifestyle changes include weight loss, eating a healthy, low-sodium diet, exercising more, and limiting alcohol. This information is not intended to replace advice given to you by your health care provider. Make sure you discuss any questions you have with your health care provider. Document Released: 07/09/2012 Document Revised: 09/12/2016 Document Reviewed: 09/12/2016 Elsevier Interactive Patient Education  2017 [employer].  Steps to Quit Smoking Smoking tobacco can be harmful to your health and can affect almost every organ in your body. Smoking puts you, and those around you, at risk for developing many serious chronic diseases. Quitting smoking is difficult, but it is one of the best things that you can do for your health. It  is never too late to quit. What are the benefits of quitting smoking? When you quit smoking, you lower your risk of developing serious diseases and conditions, such as:  Lung cancer or lung disease, such as COPD.  Heart disease.  Stroke.  Heart attack.  Infertility.  Osteoporosis and bone fractures. Additionally, symptoms such as coughing, wheezing, and shortness of breath may get better when you quit. You may also find that you get sick less often because your body is stronger at fighting off colds and infections. If you are pregnant, quitting smoking can help to reduce your chances of having a baby of low birth weight. How do I get ready to quit? When you decide to quit smoking, create a plan to make sure that you are successful. Before you quit:  Pick a date to quit. Set a date within the next two weeks to  give you time to prepare.  Write down the reasons why you are quitting. Keep this list in places where you will see it often, such as on your bathroom mirror or in your car or wallet.  Identify the people, places, things, and activities that make you want to smoke (triggers) and avoid them. Make sure to take these actions:  Throw away all cigarettes at home, at work, and in your car.  Throw away smoking accessories, such as Set designer.  Clean your car and make sure to empty the ashtray.  Clean your home, including curtains and carpets.  Tell your family, friends, and coworkers that you are quitting. Support from your loved ones can make quitting easier.  Talk with your health care provider about your options for quitting smoking.  Find out what treatment options are covered by your health insurance. What strategies can I use to quit smoking? Talk with your healthcare provider about different strategies to quit smoking. Some strategies include:  Quitting smoking altogether instead of gradually lessening how much you smoke over a period of time. Research shows that quitting "cold [place]" is more successful than gradually quitting.  Attending in-person counseling to help you build problem-solving skills. You are more likely to have success in quitting if you attend several counseling sessions. Even short sessions of 10 minutes can be effective.  Finding resources and support systems that can help you to quit smoking and remain smoke-free after you quit. These resources are most helpful when you use them often. They can include:  Online chats with a Veterinary surgeon.  Telephone quitlines.  Printed Materials engineer.  Support groups or group counseling.  Text messaging programs.  Mobile phone applications.  Taking medicines to help you quit smoking. (If you are pregnant or breastfeeding, talk with your health care provider first.) Some medicines contain nicotine and some do  not. Both types of medicines help with cravings, but the medicines that include nicotine help to relieve withdrawal symptoms. Your health care provider may recommend:  Nicotine patches, gum, or lozenges.  Nicotine inhalers or sprays.  Non-nicotine medicine that is taken by mouth. Talk with your health care provider about combining strategies, such as taking medicines while you are also receiving in-person counseling. Using these two strategies together makes you more likely to succeed in quitting than if you used either strategy on its own. If you are pregnant or breastfeeding, talk with your health care provider about finding counseling or other support strategies to quit smoking. Do not take medicine to help you quit smoking unless told to do so by your health care provider.  What things can I do to make it easier to quit? Quitting smoking might feel overwhelming at first, but there is a lot that you can do to make it easier. Take these important actions:  Reach out to your family and friends and ask that they support and encourage you during this time. Call telephone quitlines, reach out to support groups, or work with a counselor for support.  Ask people who smoke to avoid smoking around you.  Avoid places that trigger you to smoke, such as bars, parties, or smoke-break areas at work.  Spend time around people who do not smoke.  Lessen stress in your life, because stress can be a smoking trigger for some people. To lessen stress, try:  Exercising regularly.  Deep-breathing exercises.  Yoga.  Meditating.  Performing a body scan. This involves closing your eyes, scanning your body from head to toe, and noticing which parts of your body are particularly tense. Purposefully relax the muscles in those areas.  Download or purchase mobile phone or tablet apps (applications) that can help you stick to your quit plan by providing reminders, tips, and encouragement. There are many free apps,  such as QuitGuide from the [employer] developer for Disease Control and Prevention). You can find other support for quitting smoking (smoking cessation) through smokefree.gov and other websites. How will I feel when I quit smoking? Within the first 24 hours of quitting smoking, you may start to feel some withdrawal symptoms. These symptoms are usually most noticeable 2-3 days after quitting, but they usually do not last beyond 2-3 weeks. Changes or symptoms that you might experience include:  Mood swings.  Restlessness, anxiety, or irritation.  Difficulty concentrating.  Dizziness.  Strong cravings for sugary foods in addition to nicotine.  Mild weight gain.  Constipation.  Nausea.  Coughing or a sore throat.  Changes in how your medicines work in your body.  A depressed mood.  Difficulty sleeping (insomnia). After the first 2-3 weeks of quitting, you may start to notice more positive results, such as:  Improved sense of smell and taste.  Decreased coughing and sore throat.  Slower heart rate.  Lower blood pressure.  Clearer skin.  The ability to breathe more easily.  Fewer sick days. Quitting smoking is very challenging for most people. Do not get discouraged if you are not successful the first time. Some people need to make many attempts to quit before they achieve long-term success. Do your best to stick to your quit plan, and talk with your health care provider if you have any questions or concerns. This information is not intended to replace advice given to you by your health care provider. Make sure you discuss any questions you have with your health care provider. Document Released: 10/09/2001 Document Revised: 06/12/2016 Document Reviewed: 03/01/2015 Elsevier Interactive Patient Education  2017 [employer].

## 2017-02-12 ENCOUNTER — Other Ambulatory Visit: Payer: Self-pay | Admitting: Family Medicine

## 2017-02-12 DIAGNOSIS — F32A Depression, unspecified: Secondary | ICD-10-CM

## 2017-02-12 DIAGNOSIS — F329 Major depressive disorder, single episode, unspecified: Secondary | ICD-10-CM

## 2017-02-13 ENCOUNTER — Ambulatory Visit: Payer: Self-pay | Admitting: Physician Assistant

## 2017-03-07 ENCOUNTER — Ambulatory Visit: Payer: Self-pay | Admitting: Family Medicine

## 2017-03-15 ENCOUNTER — Ambulatory Visit (INDEPENDENT_AMBULATORY_CARE_PROVIDER_SITE_OTHER): Payer: Self-pay | Admitting: Family Medicine

## 2017-03-15 ENCOUNTER — Encounter: Payer: Self-pay | Admitting: Family Medicine

## 2017-03-15 VITALS — BP 121/81 | HR 91 | Temp 98.8°F | Resp 16 | Ht 63.0 in | Wt 169.8 lb

## 2017-03-15 DIAGNOSIS — F17200 Nicotine dependence, unspecified, uncomplicated: Secondary | ICD-10-CM | POA: Insufficient documentation

## 2017-03-15 DIAGNOSIS — Z720 Tobacco use: Secondary | ICD-10-CM

## 2017-03-15 DIAGNOSIS — J42 Unspecified chronic bronchitis: Secondary | ICD-10-CM

## 2017-03-15 DIAGNOSIS — T7491XS Unspecified adult maltreatment, confirmed, sequela: Secondary | ICD-10-CM

## 2017-03-15 DIAGNOSIS — I10 Essential (primary) hypertension: Secondary | ICD-10-CM

## 2017-03-15 DIAGNOSIS — F4310 Post-traumatic stress disorder, unspecified: Secondary | ICD-10-CM

## 2017-03-15 DIAGNOSIS — F331 Major depressive disorder, recurrent, moderate: Secondary | ICD-10-CM

## 2017-03-15 MED ORDER — LOSARTAN POTASSIUM-HCTZ 100-12.5 MG PO TABS: 1.0000 | ORAL_TABLET | Freq: Every day | ORAL | 0 refills | Status: DC

## 2017-03-15 MED ORDER — DULOXETINE HCL 60 MG PO CPEP: 60.0000 mg | ORAL_CAPSULE | Freq: Every day | ORAL | 1 refills | Status: DC

## 2017-03-15 MED ORDER — QUETIAPINE FUMARATE 25 MG PO TABS: 25.0000 mg | ORAL_TABLET | Freq: Every day | ORAL | 2 refills | Status: DC
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
## 2017-03-15 NOTE — Patient Instructions (Addendum)
[facility] [address], [place], [place] [address]  Phone: [phone]  [facility] [address], [place], [place] [address]  Phone: [phone]  [facility] [address] [place], [place] [address] Crisis Line Free, 24-hour Domestic Violence, Rape & Victim Assistance [phone] Available Services Rape & Victim Assistance [phone] [facility] [phone]) [phone] Consumer Credit Counseling Services [phone](336) [phone] Employee Assistance Program [phone](336) [phone] Group Counseling [phone](336) [phone] Healthy Start [phone]) [phone] Individual & Family Counseling [phone]) [phone] Female Batterers Program [phone](336) [phone] Victim Services [phone](336) [phone] Substance Abuse Services [phone]) [phone]     IF you received an x-ray today, you will receive an invoice from [facility]. Please contact [facility] at [phone] with questions or concerns regarding your invoice.   IF you received labwork today, you will receive an invoice from [facility]. Please contact [facility] at [phone] with questions or concerns regarding your invoice.   Our billing staff will not be able to assist you with questions regarding bills from these companies.  You will be contacted with the lab results as soon as they are available. The fastest way to get your results is to activate your My Chart account. Instructions are located on the last page of this paperwork. If you have not heard from us regarding the results in 2 weeks, please contact this office.    Domestic Violence Information What is domestic violence? Domestic violence, also called intimate partner violence, can involve physical, emotional, psychological, sexual, and economic abuse by a current or former intimate partner. Stalking is also considered a type of domestic violence. Domestic violence can happen between people who are or were:  Married.  Dating.  Living  together. Abusers repeatedly act to maintain control and power over their partner. Physical abuse  Physical abuse can include:  Slapping.  Hitting.  Kicking.  Punching.  Choking.  Pulling the victim's hair.  Damaging the victim's property.  Threatening or hurting the victim with weapons.  Trapping the victim in his or her home.  Forcing the victim to use drugs or alcohol. Emotional and psychological abuse  Emotional and psychological abuse can include:  Threats.  Insults.  Isolation.  Humiliation.  Jealousy and possessiveness.  Blame.  Withholding affection.  Intimidation.  Manipulation.  Limiting contact with friends and family. Sexual abuse  Sexual abuse can include:  Forcing sex.  Forcing sexual touching.  Hurting the victim during sex.  Forcing the victim to have sex with other people.  Giving the victim a sexually transmitted disease (STD) on purpose. Economic abuse  Economic abuse can include:  Controlling resources, such as money, food, transportation, a phone, or computer.  Stealing money from the victim or his or her family or friends.  Forbidding the victim to work.  Refusing to work or to contribute to the household. Stalking  Stalking can include:  Making repeated, unwanted phone calls, emails, or text messages.  Leaving cards, letters, flowers or other items the victim does not want.  Watching or following the victim from a distance.  Going to places where the victim does not want the abuser.  Entering the victim's home or car.  Damaging the victim's personal property. What are some warning signs of domestic violence? Physical signs   Bruises.  Broken bones.  Burns or cuts.  Physical pain.  Head injury. Emotional and psychological signs   Crying.  Depression.  Hopelessness.  Desperation.  Trouble sleeping.  Fear of partner.  Anxiety.  Suicidal behavior.  Antisocial behavior.  Low  self-esteem.  Fear of intimacy.  Flashbacks. Sexual signs   Bruising, swelling, or bleeding of the genital or rectal area.  Signs of a sexually transmitted infection, such as genital sores, warts, or discharge coming from the genital area.  Pain in the genital area.    Problems with pregnancy, including delayed prenatal care and prematurity. Economic signs   Having little money or food.  Homelessness.  Asking for or borrowing money. What are common behaviors of those affected by domestic violence? Those affected by domestic violence may:  Be late to work or other events.  Not show up to places as promised.  Have to let their partner know where they are and who they are with.  Be isolated or kept from seeing friends or family.  Make comments about their partner's temper or behavior.  Make excuses for their partner.  Engage in high-risk sexual behaviors.  Use drugs or alcohol.  Have unhealthy diet-related behaviors. What are common feelings of those affected by domestic violence? Those affected by domestic violence may feel that:  They have to be careful not to say or do things that trigger their partner's anger.  They cannot do anything right.  They deserve to be treated badly.  They are overreacting to their partner's behavior or temper.  They cannot trust their own feelings.  They cannot trust other people.  They are trapped.  Their partner would take away their children.  They are emotionally drained or numb.  Their life is in danger.  They might have to kill their partner to survive. Where can you get help? Domestic violence hotlines and websites  If you do not feel safe searching for help online at home, use a computer at [employer] to access [employer]. Call 911 if you are in immediate danger or need medical help.  [employer].  The 24-hour phone hotline is 254-647-2360 or 564-639-4822 (TTY).  The  videophone is available Monday through Friday, 9 a.m. to 5 p.m. Call [phone].  The website is http://thehotline.org  The National Sexual Assault Hotline.  The 24-hour phone hotline is 3122770370.  You can access the online hotline at [employer] Shelters for victims of domestic violence  If you are a victim of domestic violence, there are resources to help you find a temporary place for you and your children to live (shelter). The specific address of these shelters is often not known to the public. Police  Report assaults, threats, and stalking to the police. Counselors and counseling centers  People who have been victims of domestic violence can benefit from counseling. Counseling can help you cope with difficult emotions and empower you to plan for your future safety. The topics you discuss with a counselor are private and confidential. Children of domestic violence victims also might need counseling to manage stress and anxiety. The court system  You can work with a Clinical research associate or an advocate to get legal protection against an abuser. Protection includes restraining orders and private addresses. Crimes against you, such as assault, can also be prosecuted through the courts. Laws vary by state. This information is not intended to replace advice given to you by your health care provider. Make sure you discuss any questions you have with your health care provider. Document Released: 01/05/2004 Document Revised: 09/28/2016 Document Reviewed: 07/02/2014 Elsevier Interactive Patient Education  2017 Elsevier Inc.   Chronic Bronchitis Chronic bronchitis is a lasting inflammation of  the bronchial tubes, which are the tubes that carry air into your lungs. This is inflammation that occurs:  On most days of the week.  For at least three months at a time.  Over a period of two years in a row. When the bronchial tubes are inflamed, they start to produce mucus. The  inflammation and buildup of mucus make it more difficult to breathe. Chronic bronchitis is usually a permanent problem and is one type of chronic obstructive pulmonary disease (COPD). People with chronic bronchitis are at greater risk for getting repeated colds, or respiratory infections. What are the causes? Chronic bronchitis most often occurs in people who have:  Long-standing, severe asthma.  A history of smoking.  Asthma and who also smoke. What are the signs or symptoms? Chronic bronchitis may cause the following:  A cough that brings up mucus (productive cough).  Shortness of breath.  Early morning headache.  Wheezing.  Chest discomfort.  Recurring respiratory infections. How is this diagnosed? Your health care provider may confirm the diagnosis by:  Taking your medical history.  Performing a physical exam.  Taking a chest X-ray.  Performing pulmonary function tests. How is this treated? Treatment involves controlling symptoms with medicines, oxygen therapy, or making lifestyle changes, such as exercising and eating a healthy, well-balanced diet. Medicines could include:  Inhalers to improve air flow in and out of your lungs.  Antibiotics to treat bacterial infections, such as pneumonia, sinus infections, and acute bronchitis. As a preventative measure, your health care provider may recommend routine vaccinations for influenza and pneumonia. This is to prevent infection and hospitalization since you may be more at risk for these types of infections. Follow these instructions at home:  Take medicines only as directed by your health care provider.  If you smoke cigarettes, chew tobacco, or use electronic cigarettes, quit. If you need help quitting, ask your health care provider.  Avoid pollen, dust, animal dander, molds, smoke, and other things that cause shortness of breath or wheezing attacks.  Talk to your health care provider about possible exercise routines.  Regular exercise is very important to help you feel better.  If you are prescribed oxygen use at home follow these guidelines:  Never smoke while using oxygen. Oxygen does not burn or explode, but flammable materials will burn faster in the presence of oxygen.  Keep a Government social research officer close by. Let your fire department know that you have oxygen in your home.  Warn visitors not to smoke near you when you are using oxygen. Put up "no smoking" signs in your home where you most often use the oxygen.  Regularly test your smoke detectors at home to make sure they work. If you receive care in your home from a nurse or other health care provider, he or she may also check to make sure your smoke detectors work.  Ask your health care provider whether you would benefit from a pulmonary rehabilitation program.  Do not wait to get medical care if you have any concerning symptoms. Delays could cause permanent injury and may be life threatening. Contact a health care provider if:  You have increased coughing or shortness of breath or both.  You have muscle aches.  You have chest pain.  Your mucus gets thicker.  Your mucus changes from clear or white to yellow, green, gray, or bloody. Get help right away if:  Your usual medicines do not stop your wheezing.  You have increased difficulty breathing.  You have any problems  with the medicine you are taking, such as a rash, itching, swelling, or trouble breathing. This information is not intended to replace advice given to you by your health care provider. Make sure you discuss any questions you have with your health care provider. Document Released: 08/02/2006 Document Revised: 02/23/2016 Document Reviewed: 11/23/2013 Elsevier Interactive Patient Education  2017 [employer].

## 2017-03-25 ENCOUNTER — Telehealth: Payer: Self-pay | Admitting: Family Medicine

## 2017-03-25 NOTE — Telephone Encounter (Signed)
Patient called answering service to report depressive symptoms.  Returned call at [phone]; no answer; left message.  Will attempt to call later.  To PCP/Shaw FYI.

## 2017-03-25 NOTE — Telephone Encounter (Signed)
Called #; no answer, generic VM so did not LM.  Please call pt back. We had started her on seroquel last wk. If she is worsening on it, should stop it.   If she is a danger to herself or others, please call 911 and report. If she is having suicidal thoughts, then [facility]. If she having worsening depression, she really needs to see a psychiatrist - with everything she has gone through in the past, she needs a provider who is a specialist helping with her medications -    I recommend she call  [facility] [address], [place], [place] [address]  Phone: [phone]    You can call the 24-hour [facility] HelpLine at [phone] or [phone] for immediate assistance.   Or for more immediate assistance, she can go to the walk-in crisis services available at: [facility] [address] [place], [place] [address] [phone]) [phone]  Hour of operations: Monday-Friday, 8:30-5 p.m.  They take medicaid; and the patient can contact [facility] referral department at [phone] or [email or web address] for more information..Marland Kitchen

## 2017-03-26 ENCOUNTER — Ambulatory Visit: Payer: Self-pay | Admitting: Physician Assistant

## 2017-03-26 NOTE — Telephone Encounter (Signed)
She has an appt with english 03/27/17

## 2017-03-27 ENCOUNTER — Ambulatory Visit: Payer: Self-pay | Admitting: Physician Assistant

## 2017-03-29 ENCOUNTER — Encounter: Payer: Self-pay | Admitting: Family Medicine

## 2017-03-29 ENCOUNTER — Ambulatory Visit (INDEPENDENT_AMBULATORY_CARE_PROVIDER_SITE_OTHER): Payer: Self-pay | Admitting: Family Medicine

## 2017-03-29 ENCOUNTER — Ambulatory Visit (HOSPITAL_BASED_OUTPATIENT_CLINIC_OR_DEPARTMENT_OTHER)
Admit: 2017-03-29 | Discharge: 2017-03-29 | Disposition: A | Discharge status: Home / Self Care | Payer: BLUE CROSS/BLUE SHIELD | Attending: Family Medicine | Admitting: Family Medicine

## 2017-03-29 VITALS — BP 148/76 | HR 72 | Temp 98.0°F | Resp 16 | Ht 61.81 in | Wt 167.0 lb

## 2017-03-29 DIAGNOSIS — R270 Ataxia, unspecified: Secondary | ICD-10-CM

## 2017-03-29 DIAGNOSIS — R410 Disorientation, unspecified: Secondary | ICD-10-CM | POA: Insufficient documentation

## 2017-03-29 DIAGNOSIS — R531 Weakness: Secondary | ICD-10-CM

## 2017-03-29 DIAGNOSIS — R42 Dizziness and giddiness: Secondary | ICD-10-CM | POA: Insufficient documentation

## 2017-03-29 DIAGNOSIS — D649 Anemia, unspecified: Secondary | ICD-10-CM

## 2017-03-29 DIAGNOSIS — I6523 Occlusion and stenosis of bilateral carotid arteries: Secondary | ICD-10-CM | POA: Insufficient documentation

## 2017-03-29 LAB — POCT CBC
GRANULOCYTE PERCENT: 55.9 % (ref 37–80)
HEMATOCRIT: 36.2 % — AB (ref 37.7–47.9)
Hemoglobin: 12.5 g/dL (ref 12.2–16.2)
LYMPH, POC: 2.3 (ref 0.6–3.4)
MCH, POC: 31.8 pg — AB (ref 27–31.2)
MCHC: 34.4 g/dL (ref 31.8–35.4)
MCV: 92.4 fL (ref 80–97)
MID (CBC): 0.6 (ref 0–0.9)
MPV: 7.8 fL (ref 0–99.8)
POC GRANULOCYTE: 3.6 (ref 2–6.9)
POC LYMPH PERCENT: 35.5 %L (ref 10–50)
POC MID %: 8.6 %M (ref 0–12)
Platelet Count, POC: 216 10*3/uL (ref 142–424)
RBC: 3.92 M/uL — AB (ref 4.04–5.48)
RDW, POC: 14.9 %
WBC: 6.4 10*3/uL (ref 4.6–10.2)

## 2017-03-29 LAB — POCT URINALYSIS DIP (MANUAL ENTRY)
Bilirubin, UA: NEGATIVE
Blood, UA: NEGATIVE
Glucose, UA: NEGATIVE mg/dL
Ketones, POC UA: NEGATIVE mg/dL
Nitrite, UA: NEGATIVE
PH UA: 5.5 (ref 5.0–8.0)
PROTEIN UA: NEGATIVE mg/dL
Spec Grav, UA: 1.01 (ref 1.010–1.025)
UROBILINOGEN UA: 0.2 U/dL

## 2017-03-29 LAB — POCT GLYCOSYLATED HEMOGLOBIN (HGB A1C): Hemoglobin A1C: 6.1

## 2017-03-29 MED ORDER — LEVOFLOXACIN 750 MG PO TABS: 750.0000 mg | ORAL_TABLET | Freq: Every day | ORAL | 0 refills | Status: DC

## 2017-03-29 NOTE — Patient Instructions (Addendum)
Go to [facility] now [address], [place], [place] [address]      IF you received an x-ray today, you will receive an invoice from [facility]. Please contact [facility] at [phone] with questions or concerns regarding your invoice.   IF you received labwork today, you will receive an invoice from [facility]. Please contact [facility] at [phone] with questions or concerns regarding your invoice.   Our billing staff will not be able to assist you with questions regarding bills from these companies.  You will be contacted with the lab results as soon as they are available. The fastest way to get your results is to activate your My Chart account. Instructions are located on the last page of this paperwork. If you have not heard from us regarding the results in 2 weeks, please contact this office.

## 2017-03-29 NOTE — Progress Notes (Addendum)
Subjective:  By signing my name below, I, Essence Howell, attest that this documentation has been prepared under the direction and in the presence of Norberto SorensonEva Llewelyn Sheaffer, MD Electronically Signed: Charline BillsEssence Howell, ED Scribe 03/29/2017 at 5:39 PM.   Patient ID: Courtney FlowSandra J Blackburn, female    DOB: 1956/09/10, 61 y.o.   MRN: [id number]  Chief Complaint  Patient presents with  . Fatigue    feeling dizzy with no energy x 2 weeks  . Altered Mental Status     x 2 weeks    HPI Courtney Blackburn is a 61 y.o. female who [facility] complaining of gradually worsening fatigue for the past 2 weeks. Pt sates that she has been lethargic upon waking in the morning and her legs feel like "jelly". She states that she has to call her daughter Diannia RuderKara to come assist her with getting out of bed in the morning. Pt reports back pain that radiates into her lower extremities, unsteadiness, 1 fall while half asleep, difficulty staying awake, difficulty remembering recent events, confusion upon waking in the morning or from a nap, slurred speech, drooling on 1 side, intermittent blurred vision, tremors, urinary retention, dry cough, wheezing, mild sob. She denies sleep disturbances, constipation, diarrhea, HA, sinus pressure/pain, visual hallucinations. However, pt's daughter reports she has been recalling events that have not happened. Pt states she never started Seroquel; states it was not at the pharmacy. Pt is smoking 1 ppd. Last antibiotic use was azithromycin approximately 6 weeks ago.   Past Medical History:  Diagnosis Date  . Acid reflux   . Allergy   . Anxiety   . Depression   . Hiatal hernia   . Hypertension    Current Outpatient Prescriptions on File Prior to Visit  Medication Sig Dispense Refill  . albuterol (PROVENTIL HFA;VENTOLIN HFA) 108 (90 Base) MCG/ACT inhaler Inhale 2 puffs into the lungs every 4 (four) hours as needed for wheezing or shortness of breath (cough, shortness of breath or  wheezing.). 1 Inhaler 1  . DULoxetine (CYMBALTA) 60 MG capsule Take 1 capsule (60 mg total) by mouth daily. 90 capsule 1  . losartan-hydrochlorothiazide (HYZAAR) 100-12.5 MG tablet Take 1 tablet by mouth daily. 90 tablet 0  . QUEtiapine (SEROQUEL) 25 MG tablet Take 1 tablet (25 mg total) by mouth at bedtime. May increase to 2 tabs po qhs after 1 wk if needed. 30 tablet 2   No current facility-administered medications on file prior to visit.    Allergies  Allergen Reactions  . Codeine Other (See Comments)    headaches   Past Surgical History:  Procedure Laterality Date  . APPENDECTOMY    . TUBAL LIGATION    . WISDOM TOOTH EXTRACTION     Family History  Problem Relation Age of Onset  . Heart disease Mother   . Heart disease Father   . Cancer Sister   . Cancer Brother    Social History   Social History  . Marital status: Divorced    Spouse name: N/A  . Number of children: N/A  . Years of education: N/A   Social History Main Topics  . Smoking status: Current Every Day Smoker    Packs/day: 1.00    Years: 43.00    Types: Cigarettes  . Smokeless tobacco: Never Used  . Alcohol use No  . Drug use: No  . Sexual activity: Not Currently    Birth control/ protection: Surgical   Other Topics Concern  . None  Social History Narrative  . None   Depression screen [facility] 2/9 03/29/2017 03/15/2017 02/11/2017 09/11/2016 02/14/2016  Decreased Interest 0 0 0 0 3  Down, Depressed, Hopeless 0 0 0 0 3  PHQ - 2 Score 0 0 0 0 6  Altered sleeping - - - - 1  Tired, decreased energy - - - - 3  Change in appetite - - - - 3  Feeling bad or failure about yourself  - - - - 1  Trouble concentrating - - - - 3  Moving slowly or fidgety/restless - - - - 0  Suicidal thoughts - - - - 0  PHQ-9 Score - - - - 17  Difficult doing work/chores - - - - Extremely dIfficult    Review of Systems  Constitutional: Positive for fatigue.  HENT: Positive for drooling. Negative for sinus pain and sinus pressure.    Eyes: Positive for visual disturbance.  Respiratory: Positive for cough, shortness of breath and wheezing.   Gastrointestinal: Negative for constipation and diarrhea.  Musculoskeletal: Positive for back pain.  Neurological: Positive for dizziness, tremors and speech difficulty. Negative for headaches.  Psychiatric/Behavioral: Positive for confusion and decreased concentration. Negative for hallucinations and sleep disturbance.      Objective:   Physical Exam  Constitutional: She is oriented to person, place, and time. She appears well-developed and well-nourished. No distress.  HENT:  Head: Normocephalic and atraumatic.  Right Ear: Tympanic membrane is injected and erythematous.  Left Ear: Tympanic membrane is injected.  Nose: Rhinorrhea (purulent on the R) present.  Mouth/Throat: Posterior oropharyngeal erythema present.  Mouth/Throat: Postnasal drip  Eyes: EOM are normal. Right eye exhibits discharge (clear). Left eye exhibits discharge (clear). Right conjunctiva is injected (mild). Left conjunctiva is injected (mild).  Neck: Neck supple. No tracheal deviation present. No thyromegaly present.  Cardiovascular: Normal rate, regular rhythm, S1 normal, S2 normal and normal heart sounds.   Pulmonary/Chest: Effort normal. No respiratory distress.  Lungs are clear to auscultation. Decreased air movement.   Musculoskeletal: Normal range of motion.  No point tenderness over lumbar spine. No SI joint tenderness. Mild trochanteric bursa tenderness.   Lymphadenopathy:       Head (right side): No submandibular adenopathy present.       Head (left side): No submandibular adenopathy present.    She has no cervical adenopathy.       Right: No supraclavicular adenopathy present.       Left: No supraclavicular adenopathy present.  Neurological: She is alert and oriented to person, place, and time. No cranial nerve deficit. She displays a negative Romberg sign. Gait normal.  Reflex Scores:       Patellar reflexes are 2+ on the right side and 2+ on the left side.      Achilles reflexes are 1+ on the right side and 1+ on the left side. Cranial nerves 2-12 intact. Positive ataxia with position change. No pronator drift. Normal gait but unable to do tandem gait due to severe ataxia and imbalance.   Skin: Skin is warm and dry.  Psychiatric: She has a normal mood and affect. Her behavior is normal.  Nursing note and vitals reviewed.  BP (!) 148/76   Pulse 72   Temp 98 F (36.7 C) (Oral)   Resp 16   Ht 5' 1.81" (1.57 m)   Wt 167 lb (75.8 kg)   LMP 10/02/2012   SpO2 94%   BMI 30.73 kg/m     Results for orders placed or performed  in visit on 03/29/17  POCT urinalysis dipstick  Result Value Ref Range   Color, UA yellow yellow   Clarity, UA clear clear   Glucose, UA negative negative mg/dL   Bilirubin, UA negative negative   Ketones, POC UA negative negative mg/dL   Spec Grav, UA 9.147 8.295 - 1.025   Blood, UA negative negative   pH, UA 5.5 5.0 - 8.0   Protein Ur, POC negative negative mg/dL   Urobilinogen, UA 0.2 0.2 or 1.0 E.U./dL   Nitrite, UA Negative Negative   Leukocytes, UA Trace (A) Negative  POCT CBC  Result Value Ref Range   WBC 6.4 4.6 - 10.2 K/uL   Lymph, poc 2.3 0.6 - 3.4   POC LYMPH PERCENT 35.5 10 - 50 %L   MID (cbc) 0.6 0 - 0.9   POC MID % 8.6 0 - 12 %M   POC Granulocyte 3.6 2 - 6.9   Granulocyte percent 55.9 37 - 80 %G   RBC 3.92 (A) 4.04 - 5.48 M/uL   Hemoglobin 12.5 12.2 - 16.2 g/dL   HCT, POC 62.1 (A) 30.8 - 47.9 %   MCV 92.4 80 - 97 fL   MCH, POC 31.8 (A) 27 - 31.2 pg   MCHC 34.4 31.8 - 35.4 g/dL   RDW, POC 65.7 %   Platelet Count, POC 216 142 - 424 K/uL   MPV 7.8 0 - 99.8 fL  POCT glycosylated hemoglobin (Hb A1C)  Result Value Ref Range   Hemoglobin A1C 6.1    NEGATIVE ORTHOSTATIC BP AND PULSE Assessment & Plan:   1. Dizziness and giddiness   2. Anemia, unspecified type   3. Weakness   4. Ataxia   5. Delirium     Orders Placed  This Encounter  Procedures  . CT Head Wo Contrast    Standing Status:   Future    Number of Occurrences:   1    Standing Expiration Date:   06/29/2018    Order Specific Question:   Reason for Exam (SYMPTOM  OR DIAGNOSIS REQUIRED)    Answer:   delerium, ataxia, slurred speech, headache    Order Specific Question:   Is patient pregnant?    Answer:   No    Order Specific Question:   Preferred imaging location?    Answer:   External    Order Specific Question:   Radiology Contrast Protocol - do NOT remove file path    Answer:   [email or web address]  . Comprehensive metabolic panel  . TSH  . TSH  . Specimen status report  . Orthostatic vital signs  . POCT urinalysis dipstick  . POCT CBC  . POCT glycosylated hemoglobin (Hb A1C)    Meds ordered this encounter  Medications  . levofloxacin (LEVAQUIN) 750 MG tablet    Sig: Take 1 tablet (750 mg total) by mouth daily.    Dispense:  10 tablet    Refill:  0    I personally performed the services described in this documentation, which was scribed in my presence. The recorded information has been reviewed and considered, and addended by me as needed.   Norberto Sorenson, M.D.  Primary Care at [facility] [address] [place], [place] [address] [phone] phone [phone] fax  03/31/17 11:54 PM

## 2017-03-30 ENCOUNTER — Other Ambulatory Visit: Payer: Self-pay

## 2017-03-30 ENCOUNTER — Inpatient Hospital Stay (HOSPITAL_COMMUNITY)
Admission: EM | Admit: 2017-03-30 | Discharge: 2017-04-03 | DRG: 208 | Disposition: A | Discharge status: Home / Self Care | Payer: Self-pay | Attending: Pulmonary Disease | Admitting: Pulmonary Disease

## 2017-03-30 ENCOUNTER — Emergency Department (HOSPITAL_COMMUNITY): Payer: Self-pay

## 2017-03-30 ENCOUNTER — Encounter (HOSPITAL_COMMUNITY): Payer: Self-pay

## 2017-03-30 DIAGNOSIS — J9622 Acute and chronic respiratory failure with hypercapnia: Secondary | ICD-10-CM | POA: Diagnosis present

## 2017-03-30 DIAGNOSIS — R000 Tachycardia, unspecified: Secondary | ICD-10-CM | POA: Diagnosis present

## 2017-03-30 DIAGNOSIS — Z9911 Dependence on respirator [ventilator] status: Secondary | ICD-10-CM

## 2017-03-30 DIAGNOSIS — E039 Hypothyroidism, unspecified: Secondary | ICD-10-CM | POA: Diagnosis present

## 2017-03-30 DIAGNOSIS — J9621 Acute and chronic respiratory failure with hypoxia: Secondary | ICD-10-CM | POA: Diagnosis present

## 2017-03-30 DIAGNOSIS — R34 Anuria and oliguria: Secondary | ICD-10-CM | POA: Diagnosis present

## 2017-03-30 DIAGNOSIS — Z8249 Family history of ischemic heart disease and other diseases of the circulatory system: Secondary | ICD-10-CM

## 2017-03-30 DIAGNOSIS — F331 Major depressive disorder, recurrent, moderate: Secondary | ICD-10-CM | POA: Diagnosis present

## 2017-03-30 DIAGNOSIS — E876 Hypokalemia: Secondary | ICD-10-CM | POA: Diagnosis present

## 2017-03-30 DIAGNOSIS — J9602 Acute respiratory failure with hypercapnia: Secondary | ICD-10-CM

## 2017-03-30 DIAGNOSIS — I10 Essential (primary) hypertension: Secondary | ICD-10-CM

## 2017-03-30 DIAGNOSIS — Z79891 Long term (current) use of opiate analgesic: Secondary | ICD-10-CM

## 2017-03-30 DIAGNOSIS — J181 Lobar pneumonia, unspecified organism: Secondary | ICD-10-CM

## 2017-03-30 DIAGNOSIS — J9801 Acute bronchospasm: Secondary | ICD-10-CM

## 2017-03-30 DIAGNOSIS — R739 Hyperglycemia, unspecified: Secondary | ICD-10-CM | POA: Diagnosis present

## 2017-03-30 DIAGNOSIS — E872 Acidosis: Secondary | ICD-10-CM | POA: Diagnosis present

## 2017-03-30 DIAGNOSIS — G9341 Metabolic encephalopathy: Secondary | ICD-10-CM | POA: Diagnosis present

## 2017-03-30 DIAGNOSIS — Z9851 Tubal ligation status: Secondary | ICD-10-CM

## 2017-03-30 DIAGNOSIS — Z885 Allergy status to narcotic agent status: Secondary | ICD-10-CM

## 2017-03-30 DIAGNOSIS — T380X5A Adverse effect of glucocorticoids and synthetic analogues, initial encounter: Secondary | ICD-10-CM | POA: Diagnosis present

## 2017-03-30 DIAGNOSIS — R9401 Abnormal electroencephalogram [EEG]: Secondary | ICD-10-CM | POA: Diagnosis present

## 2017-03-30 DIAGNOSIS — J441 Chronic obstructive pulmonary disease with (acute) exacerbation: Secondary | ICD-10-CM

## 2017-03-30 DIAGNOSIS — J9601 Acute respiratory failure with hypoxia: Secondary | ICD-10-CM

## 2017-03-30 DIAGNOSIS — F17210 Nicotine dependence, cigarettes, uncomplicated: Secondary | ICD-10-CM | POA: Diagnosis present

## 2017-03-30 DIAGNOSIS — Z79899 Other long term (current) drug therapy: Secondary | ICD-10-CM

## 2017-03-30 DIAGNOSIS — J440 Chronic obstructive pulmonary disease with acute lower respiratory infection: Secondary | ICD-10-CM | POA: Diagnosis present

## 2017-03-30 DIAGNOSIS — K219 Gastro-esophageal reflux disease without esophagitis: Secondary | ICD-10-CM | POA: Diagnosis present

## 2017-03-30 DIAGNOSIS — G9340 Encephalopathy, unspecified: Secondary | ICD-10-CM

## 2017-03-30 DIAGNOSIS — J189 Pneumonia, unspecified organism: Principal | ICD-10-CM

## 2017-03-30 DIAGNOSIS — F418 Other specified anxiety disorders: Secondary | ICD-10-CM | POA: Diagnosis present

## 2017-03-30 DIAGNOSIS — F1110 Opioid abuse, uncomplicated: Secondary | ICD-10-CM | POA: Diagnosis present

## 2017-03-30 DIAGNOSIS — J42 Unspecified chronic bronchitis: Secondary | ICD-10-CM

## 2017-03-30 LAB — CBC WITH DIFFERENTIAL/PLATELET
BASOS PCT: 1 %
Basophils Absolute: 0 10*3/uL (ref 0.0–0.1)
Eosinophils Absolute: 0.2 10*3/uL (ref 0.0–0.7)
Eosinophils Relative: 3 %
HEMATOCRIT: 41.6 % (ref 36.0–46.0)
HEMOGLOBIN: 12.9 g/dL (ref 12.0–15.0)
LYMPHS ABS: 2.3 10*3/uL (ref 0.7–4.0)
Lymphocytes Relative: 36 %
MCH: 30.4 pg (ref 26.0–34.0)
MCHC: 31 g/dL (ref 30.0–36.0)
MCV: 97.9 fL (ref 78.0–100.0)
MONO ABS: 0.5 10*3/uL (ref 0.1–1.0)
MONOS PCT: 7 %
NEUTROS ABS: 3.5 10*3/uL (ref 1.7–7.7)
NEUTROS PCT: 53 %
Platelets: 215 10*3/uL (ref 150–400)
RBC: 4.25 MIL/uL (ref 3.87–5.11)
RDW: 13.8 % (ref 11.5–15.5)
WBC: 6.5 10*3/uL (ref 4.0–10.5)

## 2017-03-30 LAB — URINALYSIS, ROUTINE W REFLEX MICROSCOPIC
BILIRUBIN URINE: NEGATIVE
Glucose, UA: NEGATIVE mg/dL
HGB URINE DIPSTICK: NEGATIVE
KETONES UR: NEGATIVE mg/dL
Leukocytes, UA: NEGATIVE
NITRITE: NEGATIVE
PH: 5 (ref 5.0–8.0)
Protein, ur: NEGATIVE mg/dL
SPECIFIC GRAVITY, URINE: 1.008 (ref 1.005–1.030)

## 2017-03-30 LAB — I-STAT ARTERIAL BLOOD GAS, ED
Acid-Base Excess: 5 mmol/L — ABNORMAL HIGH (ref 0.0–2.0)
Acid-Base Excess: 5 mmol/L — ABNORMAL HIGH (ref 0.0–2.0)
BICARBONATE: 34.6 mmol/L — AB (ref 20.0–28.0)
Bicarbonate: 34.3 mmol/L — ABNORMAL HIGH (ref 20.0–28.0)
O2 SAT: 94 %
O2 Saturation: 96 %
PO2 ART: 82 mmHg — AB (ref 83.0–108.0)
TCO2: 37 mmol/L (ref 0–100)
TCO2: 37 mmol/L (ref 0–100)
pCO2 arterial: 76.5 mmHg (ref 32.0–48.0)
pCO2 arterial: 76.5 mmHg (ref 32.0–48.0)
pH, Arterial: 7.26 — ABNORMAL LOW (ref 7.350–7.450)
pH, Arterial: 7.257 — ABNORMAL LOW (ref 7.350–7.450)
pO2, Arterial: 93 mmHg (ref 83.0–108.0)

## 2017-03-30 LAB — COMPREHENSIVE METABOLIC PANEL
A/G RATIO: 1.5 (ref 1.2–2.2)
ALBUMIN: 3.8 g/dL (ref 3.5–5.0)
ALK PHOS: 85 U/L (ref 38–126)
ALT: 15 IU/L (ref 0–32)
ALT: 16 U/L (ref 14–54)
ANION GAP: 10 (ref 5–15)
AST: 19 IU/L (ref 0–40)
AST: 23 U/L (ref 15–41)
Albumin: 4.1 g/dL (ref 3.5–5.5)
Alkaline Phosphatase: 82 IU/L (ref 39–117)
BUN/Creatinine Ratio: 8 — ABNORMAL LOW (ref 9–23)
BUN: 9 mg/dL (ref 6–24)
BUN: 7 mg/dL (ref 6–20)
Bilirubin Total: 0.2 mg/dL (ref 0.0–1.2)
CALCIUM: 9.1 mg/dL (ref 8.9–10.3)
CHLORIDE: 96 mmol/L — AB (ref 101–111)
CO2: 29 mmol/L (ref 18–29)
CO2: 30 mmol/L (ref 22–32)
Calcium: 9.4 mg/dL (ref 8.7–10.2)
Chloride: 96 mmol/L (ref 96–106)
Creatinine, Ser: 1.11 mg/dL — ABNORMAL HIGH (ref 0.57–1.00)
Creatinine, Ser: 1.11 mg/dL — ABNORMAL HIGH (ref 0.44–1.00)
GFR calc Af Amer: 63 mL/min/{1.73_m2} (ref >59)
GFR calc Af Amer: >60 mL/min (ref >60)
GFR calc non Af Amer: 54 mL/min/{1.73_m2} — ABNORMAL LOW (ref >59)
GFR calc non Af Amer: 53 mL/min — ABNORMAL LOW (ref >60)
GLUCOSE: 97 mg/dL (ref 65–99)
Globulin, Total: 2.8 g/dL (ref 1.5–4.5)
Glucose: 84 mg/dL (ref 65–99)
Potassium: 3.6 mmol/L (ref 3.5–5.2)
Potassium: 3.4 mmol/L — ABNORMAL LOW (ref 3.5–5.1)
Sodium: 142 mmol/L (ref 134–144)
Sodium: 136 mmol/L (ref 135–145)
Total Bilirubin: 0.6 mg/dL (ref 0.3–1.2)
Total Protein: 6.9 g/dL (ref 6.0–8.5)
Total Protein: 7.6 g/dL (ref 6.5–8.1)

## 2017-03-30 LAB — RAPID URINE DRUG SCREEN, HOSP PERFORMED
Amphetamines: NOT DETECTED
BARBITURATES: NOT DETECTED
BENZODIAZEPINES: POSITIVE — AB
COCAINE: NOT DETECTED
Opiates: NOT DETECTED
TETRAHYDROCANNABINOL: NOT DETECTED

## 2017-03-30 LAB — I-STAT CG4 LACTIC ACID, ED
LACTIC ACID, VENOUS: 0.95 mmol/L (ref 0.5–1.9)
Lactic Acid, Venous: <0.3 mmol/L — ABNORMAL LOW (ref 0.5–1.9)

## 2017-03-30 LAB — CBG MONITORING, ED: Glucose-Capillary: 99 mg/dL (ref 65–99)

## 2017-03-30 LAB — SALICYLATE LEVEL: Salicylate Lvl: <7 mg/dL (ref 2.8–30.0)

## 2017-03-30 LAB — TSH

## 2017-03-30 LAB — ACETAMINOPHEN LEVEL

## 2017-03-30 MED ORDER — ALBUTEROL (5 MG/ML) CONTINUOUS INHALATION SOLN
10.0000 mg/h | INHALATION_SOLUTION | Freq: Once | RESPIRATORY_TRACT | Status: AC
  Administered 2017-03-30: 10 mg/h via RESPIRATORY_TRACT
  Filled 2017-03-30: qty 20

## 2017-03-30 MED ORDER — SODIUM CHLORIDE 0.9 % IV BOLUS (SEPSIS)
1000.0000 mL | Freq: Once | INTRAVENOUS | Status: AC
  Administered 2017-03-30: 1000 mL via INTRAVENOUS

## 2017-03-30 MED ORDER — DEXAMETHASONE SODIUM PHOSPHATE 10 MG/ML IJ SOLN
10.0000 mg | Freq: Once | INTRAMUSCULAR | Status: AC
  Administered 2017-03-31: 10 mg via INTRAVENOUS
  Filled 2017-03-30: qty 1

## 2017-03-30 MED ORDER — IPRATROPIUM BROMIDE 0.02 % IN SOLN
0.5000 mg | Freq: Once | RESPIRATORY_TRACT | Status: AC
  Administered 2017-03-30: 0.5 mg via RESPIRATORY_TRACT
  Filled 2017-03-30: qty 2.5

## 2017-03-30 MED ORDER — LEVOFLOXACIN IN D5W 500 MG/100ML IV SOLN
500.0000 mg | Freq: Once | INTRAVENOUS | Status: DC
  Filled 2017-03-30: qty 100

## 2017-03-30 NOTE — ED Notes (Signed)
Patient still very lethargic at this time.  Responsive to painful stimuli with facial expressions. Fluids running at this time.  Will continue to monitor patient closely.

## 2017-03-30 NOTE — ED Notes (Signed)
Attempted iv x2 Unsuccessful

## 2017-03-30 NOTE — ED Notes (Signed)
Pt placed on O2 @ 2L via [facility] for oxygen sats 85%.

## 2017-03-30 NOTE — Progress Notes (Signed)
Critical ABG values given to MD Rhunette CroftNanavati

## 2017-03-30 NOTE — Progress Notes (Signed)
  CAT started.  

## 2017-03-30 NOTE — Progress Notes (Signed)
Pt started on NIV for hypercarbic Resp Failure. MD aware

## 2017-03-30 NOTE — ED Notes (Addendum)
Courtney Blackburn [phone] contact info for daughter. Per daughter pt has had symptoms for a week. Pt seen at [place] yesterday.

## 2017-03-30 NOTE — ED Triage Notes (Addendum)
Pts daughter dropped off pt and told Deanna ArtisKeisha, RN that pt "has been lethargic, really tired, confused".  Daughter had to leave.  Pt sitting in wheelchair, drifts off to sleep, easily aroused, returns to sleep quickly. Pt c/o shortness of breath and cough.  Recent pneumonia. No c/o chest pain.  No swallowing or respiratory difficulties. Talks in complete sentences, slurs words.

## 2017-03-30 NOTE — Progress Notes (Signed)
No improvement on ABG values regarding CO2 retention . MD aware of acidosis.  BiPAP settings adjusted to try to aid better ventilation. Refer to Flowsheet.

## 2017-03-30 NOTE — ED Notes (Signed)
Dr Rhunette CroftNanavati given a copy of lactic acid results <0.30. Resp drew sample. Arterial sample. OK per Dr Rhunette CroftNanavati.

## 2017-03-30 NOTE — ED Notes (Signed)
Patient transported to X-ray 

## 2017-03-30 NOTE — ED Notes (Signed)
Pt aware of need for urine  

## 2017-03-30 NOTE — ED Provider Notes (Signed)
[facility]-EMERGENCY DEPT Provider Note   CSN: [id number] Arrival date & time: 03/30/17  1733     History   Chief Complaint Chief Complaint  Patient presents with  . Shortness of Breath  . Cough    HPI Courtney Blackburn is a 61 y.o. female.  HPI  Patient with past medical history of hypertension And COPD presents with 5-6 week history of shortness of breath and cough. She states that she was seen approximately 5 weeks ago for her shortness of breath and was told she had a lung infection and was put on antibiotics. She states that since then her symptoms have not improved. She also complains of generalized weakness and decreased energy for the past few weeks. She states that she has felt more tired than usual and has not been eating as much as she usually does. Denies chest pain, hemoptysis, productive cough, fever, trouble walking, numbness, vision changes.  History is limited due to patient's lethargy difficulty arousing. Part of history is provided by daughter.  Past Medical History:  Diagnosis Date  . Acid reflux   . Allergy   . Anxiety   . Depression   . Hiatal hernia   . Hypertension     Patient Active Problem List   Diagnosis Date Noted  . Community acquired pneumonia of left lower lobe of lung (HCC) 03/31/2017  . Acute respiratory failure with hypercapnia (HCC) 03/31/2017  . Tobacco use disorder 03/15/2017  . Chronic bronchitis (HCC) 03/15/2017  . Essential hypertension 02/14/2016  . Depression 02/14/2016    Past Surgical History:  Procedure Laterality Date  . APPENDECTOMY    . TUBAL LIGATION    . WISDOM TOOTH EXTRACTION      OB History    Gravida Para Term Preterm AB Living   1         1   SAB TAB Ectopic Multiple Live Births                   Home Medications    Prior to Admission medications   Medication Sig Start Date End Date Taking? Authorizing Provider  albuterol (PROVENTIL HFA;VENTOLIN HFA) 108 (90 Base) MCG/ACT inhaler Inhale 2 puffs into  the lungs every 4 (four) hours as needed for wheezing or shortness of breath (cough, shortness of breath or wheezing.). Patient taking differently: Inhale 2 puffs into the lungs every 4 (four) hours as needed for wheezing or shortness of breath (cough).  02/11/17  Yes Sherren Mocha, MD  Aspirin-Salicylamide-Caffeine [facility] HEADACHE POWDER PO) Take 1 packet by mouth 4 (four) times daily as needed (pain/headache).   Yes [provider]  DULoxetine (CYMBALTA) 60 MG capsule Take 1 capsule (60 mg total) by mouth daily. 03/15/17  Yes Sherren Mocha, MD  losartan-hydrochlorothiazide (HYZAAR) 100-12.5 MG tablet Take 1 tablet by mouth daily. Patient taking differently: Take 0.5 tablets by mouth daily.  03/15/17  Yes Sherren Mocha, MD  Multiple Vitamin (MULTIVITAMIN WITH MINERALS) TABS tablet Take 1 tablet by mouth daily.   Yes [provider]  omeprazole (PRILOSEC OTC) 20 MG tablet Take 20 mg by mouth daily.   Yes [provider]  OVER THE COUNTER MEDICATION Apply 1 application topically 3 (three) times daily as needed (rash under breasts). OTC antifungal cream   Yes [provider]  levofloxacin (LEVAQUIN) 750 MG tablet Take 1 tablet (750 mg total) by mouth daily. 03/29/17   Sherren Mocha, MD    Family History Family History  Problem  Relation Age of Onset  . Heart disease Mother   . Heart disease Father   . Cancer Sister   . Cancer Brother     Social History Social History  Substance Use Topics  . Smoking status: Current Every Day Smoker    Packs/day: 1.00    Years: 43.00    Types: Cigarettes  . Smokeless tobacco: Never Used  . Alcohol use No     Allergies   Codeine   Review of Systems Review of Systems  Constitutional: Positive for activity change and fatigue. Negative for appetite change, chills and fever.  HENT: Negative for ear pain, rhinorrhea, sneezing and sore throat.   Eyes: Negative for photophobia and visual disturbance.  Respiratory: Positive for cough  and shortness of breath. Negative for chest tightness and wheezing.   Cardiovascular: Negative for chest pain and palpitations.  Gastrointestinal: Negative for abdominal pain, blood in stool, constipation, diarrhea, nausea and vomiting.  Genitourinary: Negative for dysuria.  Musculoskeletal: Negative for myalgias.  Skin: Negative for rash.  Neurological: Positive for weakness. Negative for dizziness and light-headedness.     Physical Exam Updated Vital Signs BP 110/69   Pulse 65   Temp 97.5 F (36.4 C) (Oral)   Resp 16   LMP 10/02/2012   SpO2 96%   Physical Exam  Constitutional: She appears well-developed and well-nourished. She appears lethargic. She is sleeping. No distress.  HENT:  Head: Normocephalic and atraumatic.  Nose: Nose normal.  Eyes: Conjunctivae and EOM are normal. Right eye exhibits no discharge. Left eye exhibits no discharge. No scleral icterus.  Neck: Normal range of motion. Neck supple.  Cardiovascular: Normal rate, regular rhythm, normal heart sounds and intact distal pulses.  Exam reveals no gallop and no friction rub.   No murmur heard. Pulmonary/Chest: Effort normal. No respiratory distress. She has rales.  Abdominal: Soft. Bowel sounds are normal. She exhibits no distension. There is no tenderness. There is no guarding.  Musculoskeletal: Normal range of motion. She exhibits no edema.  Neurological: She appears lethargic. She exhibits normal muscle tone. Coordination normal.  Skin: Skin is warm and dry. No rash noted.  Psychiatric: She has a normal mood and affect.  Nursing note and vitals reviewed.    ED Treatments / Results  Labs (all labs ordered are listed, but only abnormal results are displayed) Labs Reviewed  COMPREHENSIVE METABOLIC PANEL - Abnormal; Notable for the following:       Result Value   Potassium 3.4 (*)    Chloride 96 (*)    Creatinine, Ser 1.11 (*)    GFR calc non Af Amer 53 (*)    All other components within normal limits    RAPID URINE DRUG SCREEN, HOSP PERFORMED - Abnormal; Notable for the following:    Benzodiazepines POSITIVE (*)    All other components within normal limits  ACETAMINOPHEN LEVEL - Abnormal; Notable for the following:    Acetaminophen (Tylenol), Serum <10 (*)    All other components within normal limits  I-STAT CG4 LACTIC ACID, ED - Abnormal; Notable for the following:    Lactic Acid, Venous <0.30 (*)    All other components within normal limits  I-STAT ARTERIAL BLOOD GAS, ED - Abnormal; Notable for the following:    pH, Arterial 7.260 (*)    pCO2 arterial 76.5 (*)    Bicarbonate 34.6 (*)    Acid-Base Excess 5.0 (*)    All other components within normal limits  I-STAT ARTERIAL BLOOD GAS, ED - Abnormal; Notable  for the following:    pH, Arterial 7.257 (*)    pCO2 arterial 76.5 (*)    pO2, Arterial 82.0 (*)    Bicarbonate 34.3 (*)    Acid-Base Excess 5.0 (*)    All other components within normal limits  I-STAT ARTERIAL BLOOD GAS, ED - Abnormal; Notable for the following:    pH, Arterial 7.261 (*)    pCO2 arterial 75.1 (*)    pO2, Arterial 69.0 (*)    Bicarbonate 33.8 (*)    Acid-Base Excess 4.0 (*)    All other components within normal limits  CULTURE, BLOOD (ROUTINE X 2)  CULTURE, BLOOD (ROUTINE X 2)  CULTURE, EXPECTORATED SPUTUM-ASSESSMENT  GRAM STAIN  CBC WITH DIFFERENTIAL/PLATELET  URINALYSIS, ROUTINE W REFLEX MICROSCOPIC  SALICYLATE LEVEL  BLOOD GAS, ARTERIAL  BLOOD GAS, ARTERIAL  HIV ANTIBODY (ROUTINE TESTING)  STREP PNEUMONIAE URINARY ANTIGEN  I-STAT CG4 LACTIC ACID, ED  CBG MONITORING, ED    EKG  EKG Interpretation None       Radiology Dg Chest 2 View  Result Date: 03/30/2017 CLINICAL DATA:  Patient with cough for 6 months.  Lethargy. EXAM: CHEST  2 VIEW COMPARISON:  Chest radiograph 10/30/2015. FINDINGS: Monitoring leads overlie the patient. Stable cardiomegaly. Patchy consolidation within the left lower hemithorax. No pleural effusion or pneumothorax.  Thoracic spine degenerative changes. IMPRESSION: Patchy consolidation within the left lower hemithorax concerning for pneumonia in the appropriate clinical setting. Followup PA and lateral chest X-ray is recommended in 3-4 weeks following trial of antibiotic therapy to ensure resolution and exclude underlying malignancy. Electronically Signed   By: Annia Belt M.D.   On: 03/30/2017 18:45   Ct Head Wo Contrast  Result Date: 03/29/2017 CLINICAL DATA:  Dizziness with slurred speech and memory problems EXAM: CT HEAD WITHOUT CONTRAST TECHNIQUE: Contiguous axial images were obtained from the base of the skull through the vertex without intravenous contrast. COMPARISON:  None. FINDINGS: Brain: No large territorial infarction, hemorrhage or intracranial mass is seen. Questionable subtle gyral edema in the right occipital lobe. Nonenlarged ventricles. No midline shift. Vascular: No hyperdense vessels. Scattered calcifications at the carotid siphons. Skull: Normal. Negative for fracture or focal lesion. Sinuses/Orbits: No acute finding. Other: None IMPRESSION: Questionable subtle gyral edema in the right occipital lobe. Correlation with MRI is suggested. There are otherwise no acute abnormalities seen. Electronically Signed   By: Jasmine Pang M.D.   On: 03/29/2017 21:09    Procedures Procedures (including critical care time) CRITICAL CARE Performed by: Dietrich Pates   Total critical care time: 40 minutes  Critical care time was exclusive of separately billable procedures and treating other patients.  Critical care was necessary to treat or prevent imminent or life-threatening deterioration.  Critical care was time spent personally by me on the following activities: development of treatment plan with patient and/or surrogate as well as nursing, discussions with consultants, evaluation of patient's response to treatment, examination of patient, obtaining history from patient or surrogate, ordering and  performing treatments and interventions, ordering and review of laboratory studies, ordering and review of radiographic studies, pulse oximetry and re-evaluation of patient's condition.   Medications Ordered in ED Medications  albuterol (PROVENTIL) (2.5 MG/3ML) 0.083% nebulizer solution 3 mL (not administered)  DULoxetine (CYMBALTA) DR capsule 60 mg (not administered)  pantoprazole (PROTONIX) EC tablet 40 mg (not administered)  multivitamin with minerals tablet 1 tablet (not administered)  enoxaparin (LOVENOX) injection 40 mg (not administered)  cefTRIAXone (ROCEPHIN) 1 g in dextrose 5 % 50 mL IVPB (  not administered)  azithromycin (ZITHROMAX) 500 mg in dextrose 5 % 250 mL IVPB (not administered)  acetaminophen (TYLENOL) tablet 650 mg (not administered)  azithromycin (ZITHROMAX) 500 mg in dextrose 5 % 250 mL IVPB (not administered)  sodium chloride 0.9 % bolus 1,000 mL (0 mLs Intravenous Stopped 03/30/17 2210)  dexamethasone (DECADRON) injection 10 mg (10 mg Intravenous Given 03/31/17 0019)  albuterol (PROVENTIL,VENTOLIN) solution continuous neb (10 mg/hr Nebulization Given 03/30/17 2339)  ipratropium (ATROVENT) nebulizer solution 0.5 mg (0.5 mg Nebulization Given 03/30/17 2339)  cefTRIAXone (ROCEPHIN) 1 g in dextrose 5 % 50 mL IVPB (1 g Intravenous New Bag/Given 03/31/17 0052)     Initial Impression / Assessment and Plan / ED Course  I have reviewed the triage vital signs and the nursing notes.  Pertinent labs & imaging results that were available during my care of the patient were reviewed by me and considered in my medical decision making (see chart for details).     Patient's history and symptoms concerning for pneumonia versus respiratory distress. Patient appears lethargic and hard to arouse on physical exam. She does have coarse breath sounds present on the left side. Patient was originally satting 85% in triage and was put on nasal cannula. Family states that she has been having this  progressive weakness and lethargy for the past few weeks. She was seen by her PCP and CT was ordered yesterday. This showed subtle questionable gyral edema. UDS positive for benzos. UA unremarkable. CBC, CMP unremarkable. Chest x-ray showed left-sided pneumonia. Patient's ABG on nasal cannula showed PCO2 of 76.5. Patient was put on BiPAP and CO2 remained 76.5. BiPAP settings were adjusted and PCO2 changed to 75. Patient remains difficult to arouse on exam. She is afebrile with no history of fever. Concern for hypercapnic respiratory failure due to her pneumonia. Began patient on Levaquin, continuous nebulizer, steroids. Called the hospitalist for admission. Consulted critical care for further adjustments of BiPAP.  Patient discussed with and seen by Dr. Rhunette CroftNanavati.   Final Clinical Impressions(s) / ED Diagnoses   Final diagnoses:  Acute hypercapnic respiratory failure [facility](HCC)    New Prescriptions New Prescriptions   No medications on file     Dietrich PatesKhatri, Everitt Wenner, PA-C 03/31/17 0149    Derwood KaplanNanavati, Ankit, MD 03/31/17 1734

## 2017-03-31 ENCOUNTER — Inpatient Hospital Stay (HOSPITAL_COMMUNITY): Payer: Self-pay

## 2017-03-31 DIAGNOSIS — J9602 Acute respiratory failure with hypercapnia: Secondary | ICD-10-CM | POA: Diagnosis present

## 2017-03-31 DIAGNOSIS — J189 Pneumonia, unspecified organism: Secondary | ICD-10-CM | POA: Diagnosis present

## 2017-03-31 DIAGNOSIS — J181 Lobar pneumonia, unspecified organism: Secondary | ICD-10-CM

## 2017-03-31 DIAGNOSIS — I10 Essential (primary) hypertension: Secondary | ICD-10-CM

## 2017-03-31 DIAGNOSIS — J441 Chronic obstructive pulmonary disease with (acute) exacerbation: Secondary | ICD-10-CM | POA: Diagnosis present

## 2017-03-31 DIAGNOSIS — J42 Unspecified chronic bronchitis: Secondary | ICD-10-CM

## 2017-03-31 LAB — BASIC METABOLIC PANEL
ANION GAP: 15 (ref 5–15)
BUN: 9 mg/dL (ref 6–20)
CALCIUM: 8.9 mg/dL (ref 8.9–10.3)
CO2: 22 mmol/L (ref 22–32)
Chloride: 100 mmol/L — ABNORMAL LOW (ref 101–111)
Creatinine, Ser: 1 mg/dL (ref 0.44–1.00)
GFR calc non Af Amer: >60 mL/min (ref >60)
Glucose, Bld: 187 mg/dL — ABNORMAL HIGH (ref 65–99)
POTASSIUM: 3.1 mmol/L — AB (ref 3.5–5.1)
Sodium: 137 mmol/L (ref 135–145)

## 2017-03-31 LAB — I-STAT ARTERIAL BLOOD GAS, ED
Acid-Base Excess: 4 mmol/L — ABNORMAL HIGH (ref 0.0–2.0)
Acid-Base Excess: 6 mmol/L — ABNORMAL HIGH (ref 0.0–2.0)
BICARBONATE: 30.2 mmol/L — AB (ref 20.0–28.0)
Bicarbonate: 33.8 mmol/L — ABNORMAL HIGH (ref 20.0–28.0)
O2 Saturation: 89 %
O2 Saturation: 99 %
PCO2 ART: 75.1 mmHg — AB (ref 32.0–48.0)
PH ART: 7.261 — AB (ref 7.350–7.450)
PO2 ART: 138 mmHg — AB (ref 83.0–108.0)
Patient temperature: 97.5
TCO2: 36 mmol/L (ref 0–100)
TCO2: 31 mmol/L (ref 0–100)
pCO2 arterial: 40.7 mmHg (ref 32.0–48.0)
pH, Arterial: 7.477 — ABNORMAL HIGH (ref 7.350–7.450)
pO2, Arterial: 69 mmHg — ABNORMAL LOW (ref 83.0–108.0)

## 2017-03-31 LAB — MRSA PCR SCREENING: MRSA by PCR: NEGATIVE

## 2017-03-31 LAB — CBC
HEMATOCRIT: 42.9 % (ref 36.0–46.0)
HEMOGLOBIN: 13.4 g/dL (ref 12.0–15.0)
MCH: 30.6 pg (ref 26.0–34.0)
MCHC: 31.2 g/dL (ref 30.0–36.0)
MCV: 97.9 fL (ref 78.0–100.0)
Platelets: 193 10*3/uL (ref 150–400)
RBC: 4.38 MIL/uL (ref 3.87–5.11)
RDW: 13.7 % (ref 11.5–15.5)
WBC: 7.8 10*3/uL (ref 4.0–10.5)

## 2017-03-31 LAB — STREP PNEUMONIAE URINARY ANTIGEN: Strep Pneumo Urinary Antigen: NEGATIVE

## 2017-03-31 LAB — PHOSPHORUS
PHOSPHORUS: 1.4 mg/dL — AB (ref 2.5–4.6)
Phosphorus: <1 mg/dL — CL (ref 2.5–4.6)

## 2017-03-31 LAB — HIV ANTIBODY (ROUTINE TESTING W REFLEX): HIV SCREEN 4TH GENERATION: NONREACTIVE

## 2017-03-31 LAB — MAGNESIUM
MAGNESIUM: 1.6 mg/dL — AB (ref 1.7–2.4)
MAGNESIUM: 2.4 mg/dL (ref 1.7–2.4)

## 2017-03-31 LAB — PROTIME-INR
INR: 1.06
PROTHROMBIN TIME: 13.8 s (ref 11.4–15.2)

## 2017-03-31 LAB — TRIGLYCERIDES: Triglycerides: 165 mg/dL — ABNORMAL HIGH (ref <150)

## 2017-03-31 LAB — TSH: TSH: 6.26 u[IU]/mL — ABNORMAL HIGH (ref 0.450–4.500)

## 2017-03-31 LAB — CREATININE, SERUM
CREATININE: 1.02 mg/dL — AB (ref 0.44–1.00)
GFR, EST NON AFRICAN AMERICAN: 59 mL/min — AB (ref >60)

## 2017-03-31 LAB — SPECIMEN STATUS REPORT

## 2017-03-31 MED ORDER — SODIUM CHLORIDE 0.9 % IV SOLN: 250.0000 mL | INTRAVENOUS | Status: DC | PRN

## 2017-03-31 MED ORDER — ENOXAPARIN SODIUM 40 MG/0.4ML ~~LOC~~ SOLN
40.0000 mg | SUBCUTANEOUS | Status: DC
  Administered 2017-03-31 – 2017-04-02 (×3): 40 mg via SUBCUTANEOUS
  Filled 2017-03-31 (×3): qty 0.4

## 2017-03-31 MED ORDER — ACETAMINOPHEN 325 MG PO TABS: 650.0000 mg | ORAL_TABLET | Freq: Four times a day (QID) | ORAL | Status: DC | PRN

## 2017-03-31 MED ORDER — ASPIRIN 81 MG PO CHEW: 324.0000 mg | CHEWABLE_TABLET | ORAL | Status: AC

## 2017-03-31 MED ORDER — ADULT MULTIVITAMIN W/MINERALS CH
1.0000 | ORAL_TABLET | Freq: Every day | ORAL | Status: DC
  Administered 2017-03-31 – 2017-04-03 (×4): 1 via ORAL
  Filled 2017-03-31 (×4): qty 1

## 2017-03-31 MED ORDER — PANTOPRAZOLE SODIUM 40 MG PO TBEC
40.0000 mg | DELAYED_RELEASE_TABLET | Freq: Every day | ORAL | Status: DC
Start: 2017-03-31 — End: 2017-03-31

## 2017-03-31 MED ORDER — ALBUTEROL SULFATE (2.5 MG/3ML) 0.083% IN NEBU: 2.5000 mg | INHALATION_SOLUTION | RESPIRATORY_TRACT | Status: DC | PRN

## 2017-03-31 MED ORDER — FENTANYL CITRATE (PF) 100 MCG/2ML IJ SOLN
100.0000 ug | INTRAMUSCULAR | Status: AC | PRN
  Administered 2017-03-31 (×3): 100 ug via INTRAVENOUS
  Filled 2017-03-31 (×4): qty 2

## 2017-03-31 MED ORDER — MIDAZOLAM HCL 2 MG/2ML IJ SOLN
INTRAMUSCULAR | Status: AC
  Filled 2017-03-31: qty 2

## 2017-03-31 MED ORDER — DULOXETINE HCL 60 MG PO CPEP
60.0000 mg | ORAL_CAPSULE | Freq: Every day | ORAL | Status: DC
  Administered 2017-03-31 – 2017-04-03 (×4): 60 mg via ORAL
  Filled 2017-03-31 (×4): qty 1

## 2017-03-31 MED ORDER — PROPOFOL 1000 MG/100ML IV EMUL
5.0000 ug/kg/min | INTRAVENOUS | Status: DC
  Administered 2017-03-31: 10 ug/kg/min via INTRAVENOUS
  Filled 2017-03-31: qty 100

## 2017-03-31 MED ORDER — ASPIRIN 300 MG RE SUPP
300.0000 mg | RECTAL | Status: AC
  Administered 2017-03-31: 300 mg via RECTAL
  Filled 2017-03-31: qty 1

## 2017-03-31 MED ORDER — ORAL CARE MOUTH RINSE
15.0000 mL | Freq: Four times a day (QID) | OROMUCOSAL | Status: DC
  Administered 2017-03-31 – 2017-04-01 (×4): 15 mL via OROMUCOSAL

## 2017-03-31 MED ORDER — BUDESONIDE 0.25 MG/2ML IN SUSP
0.2500 mg | Freq: Two times a day (BID) | RESPIRATORY_TRACT | Status: DC
  Administered 2017-03-31 – 2017-04-02 (×5): 0.25 mg via RESPIRATORY_TRACT
  Filled 2017-03-31 (×5): qty 2

## 2017-03-31 MED ORDER — ALBUTEROL SULFATE (2.5 MG/3ML) 0.083% IN NEBU: 3.0000 mL | INHALATION_SOLUTION | RESPIRATORY_TRACT | Status: DC | PRN

## 2017-03-31 MED ORDER — MAGNESIUM SULFATE 4 GM/100ML IV SOLN
4.0000 g | Freq: Once | INTRAVENOUS | Status: AC
  Administered 2017-03-31: 4 g via INTRAVENOUS
  Filled 2017-03-31: qty 100

## 2017-03-31 MED ORDER — PROPOFOL 1000 MG/100ML IV EMUL
INTRAVENOUS | Status: AC
  Administered 2017-03-31: 35 ug/kg/min via INTRAVENOUS
  Filled 2017-03-31: qty 100

## 2017-03-31 MED ORDER — PROPOFOL 1000 MG/100ML IV EMUL
0.0000 ug/kg/min | INTRAVENOUS | Status: DC
  Administered 2017-03-31: 35 ug/kg/min via INTRAVENOUS
  Administered 2017-03-31 (×2): 50 ug/kg/min via INTRAVENOUS
  Administered 2017-03-31: 40 ug/kg/min via INTRAVENOUS
  Administered 2017-04-01: 50 ug/kg/min via INTRAVENOUS
  Administered 2017-04-01: 30 ug/kg/min via INTRAVENOUS
  Filled 2017-03-31 (×7): qty 100

## 2017-03-31 MED ORDER — PREDNISONE 20 MG PO TABS
40.0000 mg | ORAL_TABLET | Freq: Every day | ORAL | Status: DC
Start: 2017-03-31 — End: 2017-04-01
  Administered 2017-03-31 – 2017-04-01 (×2): 40 mg via ORAL
  Filled 2017-03-31 (×2): qty 2

## 2017-03-31 MED ORDER — DEXTROSE 5 % IV SOLN
1.0000 g | Freq: Once | INTRAVENOUS | Status: AC
  Administered 2017-03-31: 1 g via INTRAVENOUS
  Filled 2017-03-31: qty 10

## 2017-03-31 MED ORDER — ETOMIDATE 2 MG/ML IV SOLN
INTRAVENOUS | Status: AC | PRN
  Administered 2017-03-31: 20 mg via INTRAVENOUS

## 2017-03-31 MED ORDER — FAMOTIDINE 40 MG/5ML PO SUSR
20.0000 mg | Freq: Two times a day (BID) | ORAL | Status: DC
  Administered 2017-03-31 – 2017-04-01 (×3): 20 mg
  Filled 2017-03-31 (×4): qty 2.5

## 2017-03-31 MED ORDER — MIDAZOLAM HCL 2 MG/2ML IJ SOLN
2.0000 mg | Freq: Once | INTRAMUSCULAR | Status: AC
  Administered 2017-03-31: 2 mg via INTRAVENOUS

## 2017-03-31 MED ORDER — POTASSIUM PHOSPHATES 15 MMOLE/5ML IV SOLN
30.0000 mmol | Freq: Once | INTRAVENOUS | Status: AC
  Administered 2017-03-31: 30 mmol via INTRAVENOUS
  Filled 2017-03-31: qty 10

## 2017-03-31 MED ORDER — PREDNISONE 5 MG/5ML PO SOLN: 40.0000 mg | Freq: Every day | ORAL | Status: DC

## 2017-03-31 MED ORDER — ENOXAPARIN SODIUM 40 MG/0.4ML ~~LOC~~ SOLN: 40.0000 mg | SUBCUTANEOUS | Status: DC

## 2017-03-31 MED ORDER — FENTANYL CITRATE (PF) 100 MCG/2ML IJ SOLN
INTRAMUSCULAR | Status: AC
  Filled 2017-03-31: qty 2

## 2017-03-31 MED ORDER — DEXTROSE 5 % IV SOLN
500.0000 mg | Freq: Once | INTRAVENOUS | Status: AC
  Administered 2017-03-31: 500 mg via INTRAVENOUS
  Filled 2017-03-31: qty 500

## 2017-03-31 MED ORDER — IPRATROPIUM-ALBUTEROL 0.5-2.5 (3) MG/3ML IN SOLN
3.0000 mL | RESPIRATORY_TRACT | Status: DC
  Administered 2017-03-31 (×4): 3 mL via RESPIRATORY_TRACT
  Filled 2017-03-31 (×4): qty 3

## 2017-03-31 MED ORDER — SUCCINYLCHOLINE CHLORIDE 20 MG/ML IJ SOLN
INTRAMUSCULAR | Status: AC | PRN
  Administered 2017-03-31: 100 mg via INTRAVENOUS

## 2017-03-31 MED ORDER — IPRATROPIUM-ALBUTEROL 0.5-2.5 (3) MG/3ML IN SOLN
3.0000 mL | Freq: Three times a day (TID) | RESPIRATORY_TRACT | Status: DC
  Administered 2017-04-01 – 2017-04-02 (×4): 3 mL via RESPIRATORY_TRACT
  Filled 2017-03-31 (×4): qty 3

## 2017-03-31 MED ORDER — FENTANYL CITRATE (PF) 100 MCG/2ML IJ SOLN
50.0000 ug | Freq: Once | INTRAMUSCULAR | Status: AC
  Administered 2017-03-31: 50 ug via INTRAVENOUS

## 2017-03-31 MED ORDER — DEXTROSE 5 % IV SOLN
1.0000 g | INTRAVENOUS | Status: DC
  Administered 2017-03-31 – 2017-04-02 (×3): 1 g via INTRAVENOUS
  Filled 2017-03-31 (×3): qty 10

## 2017-03-31 MED ORDER — MIDAZOLAM HCL 2 MG/2ML IJ SOLN
2.0000 mg | Freq: Once | INTRAMUSCULAR | Status: AC
  Administered 2017-03-31: 2 mg via INTRAVENOUS
  Filled 2017-03-31: qty 2

## 2017-03-31 MED ORDER — AZITHROMYCIN 500 MG IV SOLR
500.0000 mg | INTRAVENOUS | Status: DC
  Administered 2017-03-31 – 2017-04-02 (×3): 500 mg via INTRAVENOUS
  Filled 2017-03-31 (×3): qty 500

## 2017-03-31 MED ORDER — ARFORMOTEROL TARTRATE 15 MCG/2ML IN NEBU
15.0000 ug | INHALATION_SOLUTION | Freq: Two times a day (BID) | RESPIRATORY_TRACT | Status: DC
  Administered 2017-03-31 – 2017-04-02 (×5): 15 ug via RESPIRATORY_TRACT
  Filled 2017-03-31 (×5): qty 2

## 2017-03-31 MED ORDER — FENTANYL CITRATE (PF) 100 MCG/2ML IJ SOLN
100.0000 ug | INTRAMUSCULAR | Status: DC | PRN
  Administered 2017-03-31: 100 ug via INTRAVENOUS

## 2017-03-31 MED ORDER — CHLORHEXIDINE GLUCONATE 0.12% ORAL RINSE (MEDLINE KIT)
15.0000 mL | Freq: Two times a day (BID) | OROMUCOSAL | Status: DC
  Administered 2017-03-31 – 2017-04-01 (×2): 15 mL via OROMUCOSAL

## 2017-03-31 NOTE — H&P (Addendum)
History and Physical    Lynnell JudeSandra J Schaus ZOX:[id number] DOB: 11/03/1955 DOA: 03/30/2017  PCP: Sherren MochaShaw, Eva N, MD  Patient coming from: Home  I have personally briefly reviewed patient's [facility] records [facility]  Chief Complaint: SOB, cough  HPI: Mateo FlowSandra J Lover is a 61 y.o. female with medical history significant of HTN, chronic bronchitis.  Patient presents to the ED with c/o 5-6 week history of SOB and cough.  Symptoms have been ongoing, worsening, and are now associated with AMS per patients family.  Also of note just had MRI last night at "[place]" (cant see in care everywhere though) for "2 spots on brain".   ED Course: Initially patient lethargic with ABGs demonstrating respiratory acidosis.  Patient put on BIPAP but second ABG hasnt improved.  BIPAP settings adjusted, the patient is starting to wake up some, and 3rd ABG is ordered now.   Review of Systems: As per HPI otherwise 10 point review of systems negative.   Past Medical History:  Diagnosis Date  . Acid reflux   . Allergy   . Anxiety   . Depression   . Hiatal hernia   . Hypertension     Past Surgical History:  Procedure Laterality Date  . APPENDECTOMY    . TUBAL LIGATION    . WISDOM TOOTH EXTRACTION       reports that she has been smoking Cigarettes.  She has a 43.00 pack-year smoking history. She has never used smokeless tobacco. She reports that she does not drink alcohol or use drugs.  Allergies  Allergen Reactions  . Codeine Other (See Comments)    headaches    Family History  Problem Relation Age of Onset  . Heart disease Mother   . Heart disease Father   . Cancer Sister   . Cancer Brother      Prior to Admission medications   Medication Sig Start Date End Date Taking? Authorizing Provider  albuterol (PROVENTIL HFA;VENTOLIN HFA) 108 (90 Base) MCG/ACT inhaler Inhale 2 puffs into the lungs every 4 (four) hours as needed for wheezing or shortness of breath (cough,  shortness of breath or wheezing.). Patient taking differently: Inhale 2 puffs into the lungs every 4 (four) hours as needed for wheezing or shortness of breath (cough).  02/11/17  Yes Sherren MochaShaw, Eva N, MD  Aspirin-Salicylamide-Caffeine [facility](BC HEADACHE POWDER PO) Take 1 packet by mouth 4 (four) times daily as needed (pain/headache).   Yes [provider]  DULoxetine (CYMBALTA) 60 MG capsule Take 1 capsule (60 mg total) by mouth daily. 03/15/17  Yes Sherren MochaShaw, Eva N, MD  losartan-hydrochlorothiazide (HYZAAR) 100-12.5 MG tablet Take 1 tablet by mouth daily. Patient taking differently: Take 0.5 tablets by mouth daily.  03/15/17  Yes Sherren MochaShaw, Eva N, MD  Multiple Vitamin (MULTIVITAMIN WITH MINERALS) TABS tablet Take 1 tablet by mouth daily.   Yes [provider]  omeprazole (PRILOSEC OTC) 20 MG tablet Take 20 mg by mouth daily.   Yes [provider]  OVER THE COUNTER MEDICATION Apply 1 application topically 3 (three) times daily as needed (rash under breasts). OTC antifungal cream   Yes [provider]  levofloxacin (LEVAQUIN) 750 MG tablet Take 1 tablet (750 mg total) by mouth daily. 03/29/17   Sherren MochaShaw, Eva N, MD    Physical Exam: Vitals:   03/30/17 2245 03/30/17 2332 03/30/17 2345 03/31/17 0000  BP: 120/62 99/60 101/60 (!) 103/55  Pulse:  83 79 81  Resp: 11 15 14 16   Temp:  TempSrc:      SpO2:  100% 100% 96%    Constitutional: NAD, calm, comfortable Eyes: PERRL, lids and conjunctivae normal ENMT: Mucous membranes are moist. Posterior pharynx clear of any exudate or lesions.Normal dentition.  Neck: normal, supple, no masses, no thyromegaly Respiratory: no audible wheezes, adequate air movement Cardiovascular: Regular rate and rhythm, no murmurs / rubs / gallops. No extremity edema. 2+ pedal pulses. No carotid bruits.  Abdomen: no tenderness, no masses palpated. No hepatosplenomegaly. Bowel sounds positive.  Musculoskeletal: no clubbing / cyanosis. No joint deformity upper and  lower extremities. Good ROM, no contractures. Normal muscle tone.  Skin: no rashes, lesions, ulcers. No induration Neurologic: CN 2-12 grossly intact. Sensation intact, DTR normal. Strength 5/5 in all 4.  Psychiatric: Sleepy, but is able to wake up and open eyes to voice.   Labs on Admission: I have personally reviewed following labs and imaging studies  CBC:  Recent Labs Lab 03/29/17 1703 03/30/17 1801  WBC 6.4 6.5  NEUTROABS  --  3.5  HGB 12.5 12.9  HCT 36.2* 41.6  MCV 92.4 97.9  PLT  --  215   Basic Metabolic Panel:  Recent Labs Lab 03/29/17 1649 03/30/17 1801  NA 142 136  K 3.6 3.4*  CL 96 96*  CO2 29 30  GLUCOSE 84 97  BUN 9 7  CREATININE 1.11* 1.11*  CALCIUM 9.4 9.1   GFR: Estimated Creatinine Clearance: 51.8 mL/min (A) (by C-G formula based on SCr of 1.11 mg/dL (H)). Liver Function Tests:  Recent Labs Lab 03/29/17 1649 03/30/17 1801  AST 19 23  ALT 15 16  ALKPHOS 82 85  BILITOT 0.2 0.6  PROT 6.9 7.6  ALBUMIN 4.1 3.8   No results for input(s): LIPASE, AMYLASE in the last 168 hours. No results for input(s): AMMONIA in the last 168 hours. Coagulation Profile: No results for input(s): INR, PROTIME in the last 168 hours. Cardiac Enzymes: No results for input(s): CKTOTAL, CKMB, CKMBINDEX, TROPONINI in the last 168 hours. BNP (last 3 results) No results for input(s): PROBNP in the last 8760 hours. HbA1C:  Recent Labs  03/29/17 1838  HGBA1C 6.1   CBG:  Recent Labs Lab 03/30/17 2015  GLUCAP 99   Lipid Profile: No results for input(s): CHOL, HDL, LDLCALC, TRIG, CHOLHDL, LDLDIRECT in the last 72 hours. Thyroid Function Tests:  Recent Labs  03/29/17 1813  TSH CANCELED   Anemia Panel: No results for input(s): VITAMINB12, FOLATE, FERRITIN, TIBC, IRON, RETICCTPCT in the last 72 hours. Urine analysis:    Component Value Date/Time   COLORURINE YELLOW 03/30/2017 2257   APPEARANCEUR CLEAR 03/30/2017 2257   LABSPEC 1.008 03/30/2017 2257    PHURINE 5.0 03/30/2017 2257   GLUCOSEU NEGATIVE 03/30/2017 2257   HGBUR NEGATIVE 03/30/2017 2257   BILIRUBINUR NEGATIVE 03/30/2017 2257   BILIRUBINUR negative 03/29/2017 1702   KETONESUR NEGATIVE 03/30/2017 2257   PROTEINUR NEGATIVE 03/30/2017 2257   UROBILINOGEN 0.2 03/29/2017 1702   UROBILINOGEN 0.2 02/17/2014 1623   NITRITE NEGATIVE 03/30/2017 2257   LEUKOCYTESUR NEGATIVE 03/30/2017 2257    Radiological Exams on Admission: Dg Chest 2 View  Result Date: 03/30/2017 CLINICAL DATA:  Patient with cough for 6 months.  Lethargy. EXAM: CHEST  2 VIEW COMPARISON:  Chest radiograph 10/30/2015. FINDINGS: Monitoring leads overlie the patient. Stable cardiomegaly. Patchy consolidation within the left lower hemithorax. No pleural effusion or pneumothorax. Thoracic spine degenerative changes. IMPRESSION: Patchy consolidation within the left lower hemithorax concerning for pneumonia in the appropriate clinical setting. Followup  PA and lateral chest X-ray is recommended in 3-4 weeks following trial of antibiotic therapy to ensure resolution and exclude underlying malignancy. Electronically Signed   By: Annia Belt M.D.   On: 03/30/2017 18:45   Ct Head Wo Contrast  Result Date: 03/29/2017 CLINICAL DATA:  Dizziness with slurred speech and memory problems EXAM: CT HEAD WITHOUT CONTRAST TECHNIQUE: Contiguous axial images were obtained from the base of the skull through the vertex without intravenous contrast. COMPARISON:  None. FINDINGS: Brain: No large territorial infarction, hemorrhage or intracranial mass is seen. Questionable subtle gyral edema in the right occipital lobe. Nonenlarged ventricles. No midline shift. Vascular: No hyperdense vessels. Scattered calcifications at the carotid siphons. Skull: Normal. Negative for fracture or focal lesion. Sinuses/Orbits: No acute finding. Other: None IMPRESSION: Questionable subtle gyral edema in the right occipital lobe. Correlation with MRI is suggested. There are  otherwise no acute abnormalities seen. Electronically Signed   By: Jasmine Pang M.D.   On: 03/29/2017 21:09    EKG: Independently reviewed.  Assessment/Plan Principal Problem:   Community acquired pneumonia of left lower lobe of lung (HCC) Active Problems:   Essential hypertension   Chronic bronchitis (HCC)   Acute respiratory failure with hypercapnia (HCC)    1. LLL CAP with acute hypercapnic resp failure - 1. PNA pathway 2. Rocephin and azithromycin 3. Cx pending 4. Continue bipap 5. Repeat ABG pending to assess effectiveness 6. Adult wheeze protocol 7. But not really wheezing at this time so will hold off on adding steroids 2. HTN - continue home meds 3. "2 spots on brain" - will send for outside records since not showing up in care everywhere  DVT prophylaxis: Lovenox Code Status: Full Family Communication: No family in room Disposition Plan: Home after admit Consults called: None Admission status: Admit to inpatient (patient on rescue BIPAP).   Hillary Bow DO [facility] Pager [phone]  If 7AM-7PM, please contact day team taking care of patient [email or web address] Password [facility]  03/31/2017, 12:29 AM

## 2017-03-31 NOTE — ED Notes (Signed)
Attempted to call report

## 2017-03-31 NOTE — Consult Note (Addendum)
PULMONARY / CRITICAL CARE MEDICINE   Name: Courtney Blackburn MRN: [id number] DOB: 1956-01-17    ADMISSION DATE:  03/30/2017 CONSULTATION DATE:  03/31/2017  REFERRING MD :  Dr. Beacher May  CHIEF COMPLAINT:  SOB   HISTORY OF PRESENT ILLNESS:   61 yo CF with h/o depression, tobacco abuse and COPD with chronic bronchitis, (no home O2).  She is intubated and unable to provide HPI.  Information provided by her daughter at bedside.  The patient had apparently been "acting strange" for the last two weeks with SOB, non productive cough, wheezing, subjective fevers, somnolence (falling asleep while talking to daughter).  She tried to use her inhalers without benefit.  She also reportedly had hallucinations at home.    This is her first ED visit this year (per daughter) for breathing issues.  Her daughter reports that patient was at [facility] yesterday and had a CT head  PAST MEDICAL HISTORY :   has a past medical history of Acid reflux; Allergy; Anxiety; Depression; Hiatal hernia; and Hypertension.  has a past surgical history that includes Tubal ligation; Appendectomy; and Wisdom tooth extraction. Prior to Admission medications   Medication Sig Start Date End Date Taking? Authorizing Provider  albuterol (PROVENTIL HFA;VENTOLIN HFA) 108 (90 Base) MCG/ACT inhaler Inhale 2 puffs into the lungs every 4 (four) hours as needed for wheezing or shortness of breath (cough, shortness of breath or wheezing.). Patient taking differently: Inhale 2 puffs into the lungs every 4 (four) hours as needed for wheezing or shortness of breath (cough).  02/11/17  Yes Sherren Mocha, MD  Aspirin-Salicylamide-Caffeine [facility] HEADACHE POWDER PO) Take 1 packet by mouth 4 (four) times daily as needed (pain/headache).   Yes [provider]  DULoxetine (CYMBALTA) 60 MG capsule Take 1 capsule (60 mg total) by mouth daily. 03/15/17  Yes Sherren Mocha, MD  losartan-hydrochlorothiazide (HYZAAR) 100-12.5 MG tablet Take 1 tablet by  mouth daily. Patient taking differently: Take 0.5 tablets by mouth daily.  03/15/17  Yes Sherren Mocha, MD  Multiple Vitamin (MULTIVITAMIN WITH MINERALS) TABS tablet Take 1 tablet by mouth daily.   Yes [provider]  omeprazole (PRILOSEC OTC) 20 MG tablet Take 20 mg by mouth daily.   Yes [provider]  OVER THE COUNTER MEDICATION Apply 1 application topically 3 (three) times daily as needed (rash under breasts). OTC antifungal cream   Yes [provider]  levofloxacin (LEVAQUIN) 750 MG tablet Take 1 tablet (750 mg total) by mouth daily. 03/29/17   Sherren Mocha, MD   Allergies  Allergen Reactions  . Codeine Other (See Comments)    headaches    FAMILY HISTORY:  indicated that her mother is deceased. She indicated that her father is deceased. She indicated that her sister is alive. She indicated that her brother is alive.   SOCIAL HISTORY:  reports that she has been smoking Cigarettes.  She has a 43.00 pack-year smoking history. She has never used smokeless tobacco. She reports that she does not drink alcohol or use drugs.  REVIEW OF SYSTEMS:  Unable to obtain  SUBJECTIVE:   VITAL SIGNS: Temp:  [97.5 F (36.4 C)-97.8 F (36.6 C)] 97.5 F (36.4 C) (06/02 2021) Pulse Rate:  [52-83] 65 (06/03 0100) Resp:  [9-16] 16 (06/03 0100) BP: (99-125)/(51-77) 110/69 (06/03 0100) SpO2:  [87 %-100 %] 96 % (06/03 0100) FiO2 (%):  [40 %] 40 % (06/02 2343) HEMODYNAMICS:   VENTILATOR SETTINGS: FiO2 (%):  [40 %] 40 % INTAKE /  OUTPUT:  Intake/Output Summary (Last 24 hours) at 03/31/17 0239 Last data filed at 03/31/17 0159  Gross per 24 hour  Intake             1050 ml  Output                0 ml  Net             1050 ml    PHYSICAL EXAMINATION: General:  Mild acute distress. Intubated and sedated Neuro:  Sedated on propofol.  Moves all extremities spontaneously. HEENT:  NCAT, PERLA, ETT in place Cardiovascular:  RRR, s1/s2 no m/r/g Lungs:  Bilateral mechanical  breath sounds without pronounced wheezing Abdomen:  Soft, non tender non distended, normal bowel sounds Musculoskeletal:  Normal bulk and tone Skin:  No c/c.  1+ pitting edema b/l LE  LABS:  CBC  Recent Labs Lab 03/29/17 1703 03/30/17 1801  WBC 6.4 6.5  HGB 12.5 12.9  HCT 36.2* 41.6  PLT  --  215   Coag's No results for input(s): APTT, INR in the last 168 hours. BMET  Recent Labs Lab 03/29/17 1649 03/30/17 1801  NA 142 136  K 3.6 3.4*  CL 96 96*  CO2 29 30  BUN 9 7  CREATININE 1.11* 1.11*  GLUCOSE 84 97   Electrolytes  Recent Labs Lab 03/29/17 1649 03/30/17 1801  CALCIUM 9.4 9.1   Sepsis Markers  Recent Labs Lab 03/30/17 1810 03/30/17 2133  LATICACIDVEN 0.95 <0.30*   ABG  Recent Labs Lab 03/30/17 2131 03/30/17 2328 03/31/17 0110  PHART 7.260* 7.257* 7.261*  PCO2ART 76.5* 76.5* 75.1*  PO2ART 93.0 82.0* 69.0*   Liver Enzymes  Recent Labs Lab 03/29/17 1649 03/30/17 1801  AST 19 23  ALT 15 16  ALKPHOS 82 85  BILITOT 0.2 0.6  ALBUMIN 4.1 3.8   Cardiac Enzymes No results for input(s): TROPONINI, PROBNP in the last 168 hours. Glucose  Recent Labs Lab 03/30/17 2015  GLUCAP 99    Imaging Dg Chest 2 View  Result Date: 03/30/2017 CLINICAL DATA:  Patient with cough for 6 months.  Lethargy. EXAM: CHEST  2 VIEW COMPARISON:  Chest radiograph 10/30/2015. FINDINGS: Monitoring leads overlie the patient. Stable cardiomegaly. Patchy consolidation within the left lower hemithorax. No pleural effusion or pneumothorax. Thoracic spine degenerative changes. IMPRESSION: Patchy consolidation within the left lower hemithorax concerning for pneumonia in the appropriate clinical setting. Followup PA and lateral chest X-ray is recommended in 3-4 weeks following trial of antibiotic therapy to ensure resolution and exclude underlying malignancy. Electronically Signed   By: Annia Beltrew  Davis M.D.   On: 03/30/2017 18:45     ASSESSMENT / PLAN: 61 yo CF with AECOPD 2/2  CAP and evidence of hypercapnic narcosis.  PULMONARY A: Acute on chronic hypercapnic respiratory failure AECOPD LLL CAP P:   - ETT in appropriate location - VAP prevention bundle - PRVC at 8cc/kg - Check ABG - CTX and azithromycin - Prednisone 40mg  Per tube Qday x5 days - Brovana/pulmicort BID - Duonebs Q4hours - drug screen + benzos  CARDIOVASCULAR A:  Sinus tachycardia P:  - telemetry  RENAL A:   Acute respiratory acidosis HypoK P:   - electrolyte replacement  GASTROINTESTINAL A:   Not active P:   - PPI for GI ppx - APAP negative - ASA neg - Lactic acid negative  HEMATOLOGIC A:   Not active P:  - Lovenox for DVT ppx  INFECTIOUS A:   CAP P:   BCx2 03/31/2017  UA pending Sputum 03/31/2017 Abx: CTX/Azithromycin day 1/5  ENDOCRINE A:   Not active   P:   - maintain BG <180  NEUROLOGIC A:   Hypercapnic narcosis Acute metabolic encephalopathy P:   RASS goal: 0 - propofol gtt - obtain CT head from [place]  DVT ppx: Lovenox FEN: Tube feed in AM Code status: FULL   FAMILY  - Updates: discussed plan with family at bedside  Total critical care time: 30 min  Critical care time was exclusive of separately billable procedures and treating other patients.  Critical care was necessary to treat or prevent imminent or life-threatening deterioration.  Critical care was time spent personally by me on the following activities: development of treatment plan with patient and/or surrogate as well as nursing, discussions with consultants, evaluation of patient's response to treatment, examination of patient, obtaining history from patient or surrogate, ordering and performing treatments and interventions, ordering and review of laboratory studies, ordering and review of radiographic studies, pulse oximetry and re-evaluation of patient's condition.   Galvin Proffer, DO., MS [facility]    Pulmonary and Critical Care  Medicine [facility] Pager: [phone]  03/31/2017, 2:39 AM

## 2017-03-31 NOTE — Progress Notes (Signed)
[facility] and is concerned of pts previous drug abuse history. Sister feels pt is currently abusing percocet with daughter. Nursing will cont to monitor.

## 2017-03-31 NOTE — Progress Notes (Signed)
eLink Physician-Brief Progress Note Patient Name: Mateo FlowSandra J Tardiff DOB: 1956-04-02 MRN: [id number]   Date of Service  03/31/2017  HPI/Events of Note  Oliguria - Bladder scan with 900 mL residual.   eICU Interventions  Will place Foley catheter.      Intervention Category Intermediate Interventions: Oliguria - evaluation and management  Sommer,Steven Eugene 03/31/2017, 10:36 PM

## 2017-03-31 NOTE — Progress Notes (Signed)
Pt failed BiPAP hypercarbic respiratory failure. Pt remain difficult to arouse on the NIV machine.  Pt has had multiple arterial sticks to assess acidosis change. Pt remained acidotic pH: 7.26 and CO2 in the mid 70's. Pt was intubated per EDMD.

## 2017-03-31 NOTE — Progress Notes (Signed)
S:  RN reports pt not following commands, currently on propofol   O: Blood pressure 134/78, pulse 91, temperature 97.7 F (36.5 C), temperature source Oral, resp. rate (!) 25, last menstrual period 10/02/2012, SpO2 98 %.  General:  Adult female in NAD on vnet  HEENT: MM pink/moist, ETT Neuro: sedate on vent, localizes to painful stimulation, no follow commands, propofol turned off > pt tries to turn in bed, moves all extremities, mild tremor/shaking of arms CV: s1s2 rrr, no m/r/g PULM: even/non-labored, lungs bilaterally coarse ZO:[id number]:soft, non-tender, bsx4 active  Extremities: warm/dry, no edema  Skin: no rashes or lesions    Recent Labs Lab 03/29/17 1703 03/30/17 1801 03/31/17 0437  HGB 12.5 12.9 13.4  HCT 36.2* 41.6 42.9  WBC 6.4 6.5 7.8  PLT  --  215 193    Recent Labs Lab 03/29/17 1649 03/30/17 1801 03/31/17 0437  NA 142 136  --   K 3.6 3.4*  --   CL 96 96*  --   CO2 29 30  --   GLUCOSE 84 97  --   BUN 9 7  --   CREATININE 1.11* 1.11* 1.02*  CALCIUM 9.4 9.1  --   MG  --   --  1.6*  PHOS  --   --  <1.0*    A: Acute Exacerbation of COPD CAP Acute on Chronic Hypercarbic Respiratory Failure Sinus Tachycardia  Hypokalemia Hypomagnesemia  Hypophosphatemia   Acute Metabolic Encephalopathy - reportedly normal mental status on presentation, ? [facility] with inability to release info as date of birth mixed up Depression / Anxiety  UDS positive for Benzo's on admit  P: PRVC 8cc/kg  Wean PEEP / FiO2 for sats > 88% Intermittent CXR  Rocephin / Azithro, D2/x  Hold propofol now for neuro exam  Follow up ABG, BMP, CBC in am  Obtain EEG  Follow up BMP, Mg, Phos this afternoon given replacement   Additional CC Time: 30 minutes  Courtney BrimBrandi Ollis, NP-C [place] [facility] Pgr: [phone] or if no answer [phone] 03/31/2017, 12:53 PM  Attending:  Seen and examined independently today. History taken from sister at length. She reports  suspicion of narcotic abuse over the last several months. Some decline in mental status which is been unexplained, however the sister has suspected narcotic abuse.  Overnight no acute events, when we positive propofol this morning we saw some improvement in mental status.  Exam: Sedated on ventilator Lungs clear to auscultation normal air movement with ventilator supported breathing Cardiovascular regular rate and rhythm no murmurs gallops or rubs Belly soft nontender  Acute respiratory failure with hypoxemia: Continue full ventilator support, bronchodilators for possible COPD Likely COPD exacerbation: Continue bronchodilators and antibiotics Acute encephalopathy: Continue propofol for now for ventilator synchrony, likely extubation in a.m.  Critical care time 30 minutes  Courtney CarolinaBrent Janelie Goltz, MD [place] Pager: [phone] Cell: [phone] After 3pm or if no response, call [phone]

## 2017-03-31 NOTE — ED Notes (Signed)
[phone] Diannia RuderKara [phone] Tammy SoursGreg

## 2017-03-31 NOTE — ED Notes (Signed)
Critical lab result called to CCM. Orders received

## 2017-03-31 NOTE — ED Notes (Signed)
edp at bedside  

## 2017-03-31 NOTE — Progress Notes (Signed)
eLink Physician-Brief Progress Note Patient Name: Courtney Blackburn DOB: [birth year] MRN: [id number]   Date of Service  03/31/2017  HPI/Events of Note  Hypophos, mag, and potassium   eICU Interventions  Phos, mag and potassium replaced     Intervention Category Intermediate Interventions: Electrolyte abnormality - evaluation and management  [name] 03/31/2017, 6:11 AM

## 2017-03-31 NOTE — Progress Notes (Signed)
eLink Physician-Brief Progress Note Patient Name: Courtney FlowSandra J Blackburn DOB: 1956/09/22 MRN: [id number]   Date of Service  03/31/2017  HPI/Events of Note  K+ = 3.1, PO4--- = 1.4 and Creatinine = 1.0.  eICU Interventions  Will replace K+ and PO4---.     Intervention Category Major Interventions: Electrolyte abnormality - evaluation and management  Perri Aragones Eugene 03/31/2017, 6:45 PM

## 2017-03-31 NOTE — ED Provider Notes (Signed)
Patient admitted to medicine service for hypoxic respiratory distress from pneumonia, COPD. Had multiple ABGs after being on bipap. PH remained 7.26, with CO2 75. Remained difficult to arouse and began to drop bp to 80s. I think at this point, she will need intubation. Had extensive discussion with husband and daughter regarding code status and they state that she is full code. I intubated her and it was confirmed by cxr. Critical care will admit now.   INTUBATION Performed by: Richardean Canalavid H Mazey Mantell  Required items: required blood products, implants, devices, and special equipment available Patient identity confirmed: provided demographic data and hospital-assigned identification number Time out: Immediately prior to procedure a "time out" was called to verify the correct patient, procedure, equipment, support staff and site/side marked as required.  Indications: hypoxia, lethargy  Intubation method: Glidescope Laryngoscopy   Preoxygenation: BVM  Sedatives: 20 mg Etomidate Paralytic: 100 mg Succinylcholine  Tube Size: 7.5 cuffed  Post-procedure assessment: chest rise and ETCO2 monitor Breath sounds: equal and absent over the epigastrium Tube secured with: ETT holder Chest x-ray interpreted by radiologist and me.  Chest x-ray findings: endotracheal tube in appropriate position  Patient tolerated the procedure well with no immediate complications.  CRITICAL CARE Performed by: Richardean Canalavid H Alylah Blakney   Total critical care time: 30 minutes  Critical care time was exclusive of separately billable procedures and treating other patients.  Critical care was necessary to treat or prevent imminent or life-threatening deterioration.  Critical care was time spent personally by me on the following activities: development of treatment plan with patient and/or surrogate as well as nursing, discussions with consultants, evaluation of patient's response to treatment, examination of patient, obtaining history from  patient or surrogate, ordering and performing treatments and interventions, ordering and review of laboratory studies, ordering and review of radiographic studies, pulse oximetry and re-evaluation of patient's condition.       Charlynne PanderYao, Paxtyn Boyar Hsienta, MD 03/31/17 0230

## 2017-04-01 ENCOUNTER — Telehealth: Payer: Self-pay | Admitting: Family Medicine

## 2017-04-01 ENCOUNTER — Inpatient Hospital Stay (HOSPITAL_COMMUNITY): Payer: Self-pay

## 2017-04-01 DIAGNOSIS — J441 Chronic obstructive pulmonary disease with (acute) exacerbation: Secondary | ICD-10-CM

## 2017-04-01 DIAGNOSIS — G9340 Encephalopathy, unspecified: Secondary | ICD-10-CM

## 2017-04-01 LAB — BASIC METABOLIC PANEL
ANION GAP: 12 (ref 5–15)
BUN: 10 mg/dL (ref 6–20)
CHLORIDE: 98 mmol/L — AB (ref 101–111)
CO2: 23 mmol/L (ref 22–32)
Calcium: 8.5 mg/dL — ABNORMAL LOW (ref 8.9–10.3)
Creatinine, Ser: 0.97 mg/dL (ref 0.44–1.00)
GFR calc Af Amer: >60 mL/min (ref >60)
GFR calc non Af Amer: >60 mL/min (ref >60)
GLUCOSE: 231 mg/dL — AB (ref 65–99)
POTASSIUM: 3.7 mmol/L (ref 3.5–5.1)
Sodium: 133 mmol/L — ABNORMAL LOW (ref 135–145)

## 2017-04-01 LAB — GLUCOSE, CAPILLARY
GLUCOSE-CAPILLARY: 112 mg/dL — AB (ref 65–99)
GLUCOSE-CAPILLARY: 132 mg/dL — AB (ref 65–99)
GLUCOSE-CAPILLARY: 123 mg/dL — AB (ref 65–99)
Glucose-Capillary: 112 mg/dL — ABNORMAL HIGH (ref 65–99)
Glucose-Capillary: 122 mg/dL — ABNORMAL HIGH (ref 65–99)

## 2017-04-01 LAB — CBC
HEMATOCRIT: 37.1 % (ref 36.0–46.0)
HEMOGLOBIN: 11.9 g/dL — AB (ref 12.0–15.0)
MCH: 30 pg (ref 26.0–34.0)
MCHC: 32.1 g/dL (ref 30.0–36.0)
MCV: 93.5 fL (ref 78.0–100.0)
Platelets: 201 10*3/uL (ref 150–400)
RBC: 3.97 MIL/uL (ref 3.87–5.11)
RDW: 14 % (ref 11.5–15.5)
WBC: 13.9 10*3/uL — ABNORMAL HIGH (ref 4.0–10.5)

## 2017-04-01 LAB — PHOSPHORUS: Phosphorus: 3.7 mg/dL (ref 2.5–4.6)

## 2017-04-01 LAB — MAGNESIUM: Magnesium: 2 mg/dL (ref 1.7–2.4)

## 2017-04-01 MED ORDER — INSULIN ASPART 100 UNIT/ML ~~LOC~~ SOLN
2.0000 [IU] | SUBCUTANEOUS | Status: DC
  Administered 2017-04-01 (×3): 2 [IU] via SUBCUTANEOUS

## 2017-04-01 MED ORDER — WHITE PETROLATUM GEL
Status: AC
  Administered 2017-04-02: 04:00:00
  Filled 2017-04-01: qty 1

## 2017-04-01 MED ORDER — VITAL HIGH PROTEIN PO LIQD: 1000.0000 mL | ORAL | Status: DC

## 2017-04-01 MED ORDER — PRO-STAT SUGAR FREE PO LIQD
30.0000 mL | Freq: Two times a day (BID) | ORAL | Status: DC
  Administered 2017-04-01: 30 mL
  Filled 2017-04-01: qty 30

## 2017-04-01 MED ORDER — METHYLPREDNISOLONE SODIUM SUCC 40 MG IJ SOLR
40.0000 mg | Freq: Two times a day (BID) | INTRAMUSCULAR | Status: DC
  Administered 2017-04-01 – 2017-04-02 (×2): 40 mg via INTRAVENOUS
  Filled 2017-04-01 (×2): qty 1

## 2017-04-01 MED ORDER — PANTOPRAZOLE SODIUM 40 MG IV SOLR
40.0000 mg | INTRAVENOUS | Status: DC
  Administered 2017-04-01: 40 mg via INTRAVENOUS
  Filled 2017-04-01: qty 40

## 2017-04-01 MED ORDER — POTASSIUM CHLORIDE 20 MEQ/15ML (10%) PO SOLN
20.0000 meq | Freq: Once | ORAL | Status: AC
Start: 2017-04-01 — End: 2017-04-01
  Administered 2017-04-01: 20 meq
  Filled 2017-04-01: qty 15

## 2017-04-01 NOTE — Progress Notes (Signed)
EEG Completed; Results Pending  

## 2017-04-01 NOTE — Progress Notes (Signed)
Pt removed IV that RN has placed. RN replaced IV in hand. Safety mitts applied to patient. Pt is oriented to self, time, and situation.

## 2017-04-01 NOTE — Progress Notes (Addendum)
PULMONARY / CRITICAL CARE MEDICINE   Name: Courtney Blackburn MRN: [id number] DOB: December 21, 1955    ADMISSION DATE:  03/30/2017 CONSULTATION DATE:  03/31/2017  REFERRING MD :  Dr. Beacher May  CHIEF COMPLAINT:  SOB  Brief:   61 yo CF with h/o depression, tobacco abuse and COPD with chronic bronchitis, (no home O2).    Presented to ED on 6/2 with 5-6 week reported dyspnea/cough and increasing somnolence. Upon arrival to ED patient was lethargic, ABG with respiratory acidosis, CXR revealed possible LLL CAP and was placed on BiPAP. Admitted under [facility], however remained difficult to arouse, ABG 7.26 with CO2 70, was then intubated.   Family states concerns for narcotic abuse over the last several months.   SUBJECTIVE:  No events overnight. Currently weaning. Remains on low does propofol.   VITAL SIGNS: Temp:  [97.2 F (36.2 C)-98.2 F (36.8 C)] 97.4 F (36.3 C) (06/04 0736) Pulse Rate:  [71-97] 95 (06/04 0830) Resp:  [18-40] 21 (06/04 0830) BP: (87-136)/(44-88) 117/66 (06/04 0800) SpO2:  [93 %-100 %] 100 % (06/04 0830) FiO2 (%):  [30 %-40 %] 40 % (06/04 0802) Weight:  [75.1 kg (165 lb 9.1 oz)-77.7 kg (171 lb 4.8 oz)] 77.7 kg (171 lb 4.8 oz) (06/04 0401) HEMODYNAMICS:   VENTILATOR SETTINGS: Vent Mode: PSV;CPAP FiO2 (%):  [30 %-40 %] 40 % Set Rate:  [23 bmp-25 bmp] 25 bmp Vt Set:  [450 mL] 450 mL PEEP:  [5 cmH20] 5 cmH20 Pressure Support:  [10 cmH20] 10 cmH20 Plateau Pressure:  [18 cmH20-22 cmH20] 18 cmH20 INTAKE / OUTPUT:  Intake/Output Summary (Last 24 hours) at 04/01/17 0843 Last data filed at 04/01/17 0800  Gross per 24 hour  Intake           763.38 ml  Output             1300 ml  Net          -536.62 ml    PHYSICAL EXAMINATION: General appearance: female, well nourished, no distress Eyes:  PERRL, EOMI bilaterally. Mouth:  membranes and no mucosal ulcerations Neck: Trachea midline, ETT in place  Lungs/chest: CTA, no wheeze, no crackles  CV: RRR, no MRGs  Abdomen:  Soft, non-tender; no masses Extremities: No peripheral edema  Skin: Normal temperature, turgor and texture Psych: Sedated, follows commands, moves all extremities   LABS:  CBC  Recent Labs Lab 03/30/17 1801 03/31/17 0437 04/01/17 0304  WBC 6.5 7.8 13.9*  HGB 12.9 13.4 11.9*  HCT 41.6 42.9 37.1  PLT 215 193 201   Coag's  Recent Labs Lab 03/31/17 0437  INR 1.06   BMET  Recent Labs Lab 03/30/17 1801 03/31/17 0437 03/31/17 1640 04/01/17 0304  NA 136  --  137 133*  K 3.4*  --  3.1* 3.7  CL 96*  --  100* 98*  CO2 30  --  22 23  BUN 7  --  9 10  CREATININE 1.11* 1.02* 1.00 0.97  GLUCOSE 97  --  187* 231*   Electrolytes  Recent Labs Lab 03/30/17 1801 03/31/17 0437 03/31/17 1640 04/01/17 0304  CALCIUM 9.1  --  8.9 8.5*  MG  --  1.6* 2.4  --   PHOS  --  <1.0* 1.4*  --    Sepsis Markers  Recent Labs Lab 03/30/17 1810 03/30/17 2133  LATICACIDVEN 0.95 <0.30*   ABG  Recent Labs Lab 03/30/17 2328 03/31/17 0110 03/31/17 0411  PHART 7.257* 7.261* 7.477*  PCO2ART 76.5* 75.1* 40.7  PO2ART  82.0* 69.0* 138.0*   Liver Enzymes  Recent Labs Lab 03/29/17 1649 03/30/17 1801  AST 19 23  ALT 15 16  ALKPHOS 82 85  BILITOT 0.2 0.6  ALBUMIN 4.1 3.8   Cardiac Enzymes No results for input(s): TROPONINI, PROBNP in the last 168 hours. Glucose  Recent Labs Lab 03/30/17 2015  GLUCAP 99    Imaging Dg Chest Port 1 View  Result Date: 04/01/2017 CLINICAL DATA:  Acute respiratory failure with hypoxia. EXAM: PORTABLE CHEST 1 VIEW COMPARISON:  03/31/2017 FINDINGS: Endotracheal tube tip is 3 cm above the carina. Nasogastric tube enters the stomach. Line artifact overlies the chest. Persistent mild basilar infiltrate/volume loss, slightly worsened radiographically. No qualitatively new finding. IMPRESSION: Slight radiographic worsening of patchy pneumonia in both lower lobes. Electronically Signed   By: Paulina FusiMark  Shogry M.D.   On: 04/01/2017 06:59   STUDIES: CT Head  6/1 > ?subtle gyral edema in the right occipital lobe CXR 6/2 > Patchy consolidation within the left lower hemithorax concerning for pneumonia in the appropriate clinical setting CXR 6/4 > Slight radiographic worsening of patchy pneumonia in both lower lobes EEG 6/4 >>   ASSESSMENT / PLAN: 61 yo CF with AECOPD 2/2 CAP and evidence of hypercapnic narcosis.  PULMONARY A: Acute on chronic hypercapnic respiratory failure +/- AECOPD LLL CAP P:   Vent Support - PRVC at 8cc/kg Wean as tolerated > currently weaning > will access for ability to extubate   VAP prevention bundle Continue Prednisone  Trend CXR  Brovana/pulmicort BID Duonebs Q4hours  CARDIOVASCULAR A:  Sinus tachycardia - Improved  P:  Cardiac Monitoring  Maintain MAP > 65   RENAL A:   Hypokalemia  P:   BMP Replace electrolytes as needed   GASTROINTESTINAL A:   Not active P:   PPI for GI ppx NPO Start TF if do not extubate    HEMATOLOGIC A:   DVT ppx P:  Continue Lovenox  Trend CBC   INFECTIOUS A:   CAP P:   Trend WBC and Fever curve  Follow Culture Data  Continue CTX/Azithromycin (Day 3/5)  ENDOCRINE A:   Hyperglycemia  P:   Trend Glucose  SSI   NEUROLOGIC A:   Acute metabolic encephalopathy  -reported narcotic abuse  -UDS +for benzo  H/O Depression  P:   RASS goal: 0/-1 Wean propofol gtt to achieve RASS  EEG pending  Daily WUA  Continue Cymbalta   FAMILY  - Updates: no family at bedside    CC Time: 32 minutes   Jovita KussmaulKatalina Eubanks, AGACNP-BC [facility]  Pgr: [phone]  PCCM Pgr: [phone]  Tolerating SBT.    BP (!) 158/86   Pulse 94   Temp 98.7 F (37.1 C) (Oral)   Resp 15   Ht 5\' 5"  (1.651 m)   Wt 171 lb 4.8 oz (77.7 kg)   LMP 10/02/2012   SpO2 100%   BMI 28.51 kg/m   Follows commands.  HR regular.  Abd soft.  No wheeze.  No edema.  CMP Latest Ref Rng & Units 04/01/2017 03/31/2017 03/31/2017  Glucose 65 - 99 mg/dL 132(G231(H) 401(U187(H) -   BUN 6 - 20 mg/dL 10 9 -  Creatinine 2.720.44 - 1.00 mg/dL 5.360.97 6.441.00 0.34(V1.02(H)  Sodium 135 - 145 mmol/L 133(L) 137 -  Potassium 3.5 - 5.1 mmol/L 3.7 3.1(L) -  Chloride 101 - 111 mmol/L 98(L) 100(L) -  CO2 22 - 32 mmol/L 23 22 -  Calcium 8.9 - 10.3 mg/dL 4.2(V8.5(L)  8.9 -  Total Protein 6.5 - 8.1 g/dL - - -  Total Bilirubin 0.3 - 1.2 mg/dL - - -  Alkaline Phos 38 - 126 U/L - - -  AST 15 - 41 U/L - - -  ALT 14 - 54 U/L - - -   CBC Latest Ref Rng & Units 04/01/2017 03/31/2017 03/30/2017  WBC 4.0 - 10.5 K/uL 13.9(H) 7.8 6.5  Hemoglobin 12.0 - 15.0 g/dL 11.9(L) 13.4 12.9  Hematocrit 36.0 - 46.0 % 37.1 42.9 41.6  Platelets 150 - 400 K/uL 201 193 215   ABG    Component Value Date/Time   PHART 7.477 (H) 03/31/2017 0411   PCO2ART 40.7 03/31/2017 0411   PO2ART 138.0 (H) 03/31/2017 0411   HCO3 30.2 (H) 03/31/2017 0411   TCO2 31 03/31/2017 0411   O2SAT 99.0 03/31/2017 0411   Dg Chest 2 View  Result Date: 03/30/2017 CLINICAL DATA:  Patient with cough for 6 months.  Lethargy. EXAM: CHEST  2 VIEW COMPARISON:  Chest radiograph 10/30/2015. FINDINGS: Monitoring leads overlie the patient. Stable cardiomegaly. Patchy consolidation within the left lower hemithorax. No pleural effusion or pneumothorax. Thoracic spine degenerative changes. IMPRESSION: Patchy consolidation within the left lower hemithorax concerning for pneumonia in the appropriate clinical setting. Followup PA and lateral chest X-ray is recommended in 3-4 weeks following trial of antibiotic therapy to ensure resolution and exclude underlying malignancy. Electronically Signed   By: Annia Belt M.D.   On: 03/30/2017 18:45   Dg Chest Port 1 View  Result Date: 04/01/2017 CLINICAL DATA:  Acute respiratory failure with hypoxia. EXAM: PORTABLE CHEST 1 VIEW COMPARISON:  03/31/2017 FINDINGS: Endotracheal tube tip is 3 cm above the carina. Nasogastric tube enters the stomach. Line artifact overlies the chest. Persistent mild basilar infiltrate/volume loss, slightly  worsened radiographically. No qualitatively new finding. IMPRESSION: Slight radiographic worsening of patchy pneumonia in both lower lobes. Electronically Signed   By: Paulina Fusi M.D.   On: 04/01/2017 06:59   Dg Chest Portable 1 View  Result Date: 03/31/2017 CLINICAL DATA:  Intubation EXAM: PORTABLE CHEST 1 VIEW COMPARISON:  03/30/2017 FINDINGS: Interval placement of endotracheal tube, the tip is about 4.3 cm superior to the carina. Patchy infiltrate at the left lung base. No large effusion. Stable cardiomediastinal silhouette. Esophageal tube tip is below the diaphragm but not included on the image. IMPRESSION: Endotracheal tube tip approximately 4.3 cm superior to the carina. Patchy infiltrate at the left lung base. Electronically Signed   By: Jasmine Pang M.D.   On: 03/31/2017 03:05   Dg Abd Portable 1 View  Result Date: 03/31/2017 CLINICAL DATA:  OG tube placement EXAM: PORTABLE ABDOMEN - 1 VIEW COMPARISON:  None. FINDINGS: Tip of the esophageal tube is in the left upper quadrant over the proximal stomach, side-port in the region of GE junction. Gas pattern unremarkable. IMPRESSION: Esophageal tube tip in the left upper quadrant over the proximal stomach, side-port overlies the GE junction, consider further advancement by several cm for more optimal position. Electronically Signed   By: Jasmine Pang M.D.   On: 03/31/2017 03:06    EEG - moderate slowing  Assessment/plan:  Acute respiratory failure hypoxia AECOPD, PNA - extubate - BDs - continue Abx  Acute metabolic encephalopathy  - monitor mental status  CC time by me independent of APP time 31 minutes  Coralyn Helling, MD [facility] 04/01/2017, 1:28 PM Pager:  [phone] After 3pm call: [phone]

## 2017-04-01 NOTE — Procedures (Signed)
ELECTROENCEPHALOGRAM REPORT  Date of Study: 04/01/2017  Patient's Name: Courtney Blackburn MRN: [id number] Date of Birth: 11/13/1955  Referring Provider: Noe Gens, NP  Clinical History: This is a 61 year old woman with altered mental status, twitching.  Medications: propofol (DIPRIVAN) 1000 MG/100ML infusion  acetaminophen (TYLENOL) tablet 650 mg  albuterol (PROVENTIL) (2.5 MG/3ML) 0.083% nebulizer solution 2.5 mg  arformoterol (BROVANA) nebulizer solution 15 mcg  azithromycin (ZITHROMAX) 500 mg in dextrose 5 % 250 mL IVPB  budesonide (PULMICORT) nebulizer solution 0.25 mg  cefTRIAXone (ROCEPHIN) 1 g in dextrose 5 % 50 mL IVPB  chlorhexidine gluconate (MEDLINE KIT) (PERIDEX) 0.12 % solution 15 mL  DULoxetine (CYMBALTA) DR capsule 60 mg  enoxaparin (LOVENOX) injection 40 mg  famotidine (PEPCID) 40 MG/5ML suspension 20 mg  pratropium-albuterol (DUONEB) 0.5-2.5 (3) MG/3ML nebulizer solution 3 mL  multivitamin with minerals tablet 1 tablet  predniSONE (DELTASONE) tablet 40 mg   Technical Summary: A multichannel digital EEG recording measured by the international 10-20 system with electrodes applied with paste and impedances below 5000 ohms performed as portable with EKG monitoring in an awake and asleep patient.  Hyperventilation and photic stimulation were not performed.  The digital EEG was referentially recorded, reformatted, and digitally filtered in a variety of bipolar and referential montages for optimal display.   Description: The patient is awake and asleep during the recording. She is intubated and sedated on Propofol. Propofol is turned off 20 minutes into the recording. There is no clear posterior dominant rhythm. The background consists of a large amount of diffuse 4-5 Hz theta and 2-3 Hz delta slowing admixed with alpha activity.  During drowsiness and sleep, there is an increase in theta and delta slowing of the background with poorly formed vertex waves seen.   Hyperventilation and photic stimulation were not performed. Patient is noted to have right arm and shoulder twitching with no associated epileptiform correlate seen.  There were no epileptiform discharges or electrographic seizures seen.    EKG lead was unremarkable.  Impression: This awake and asleep EEG is abnormal due to moderate diffuse slowing of the background.  Clinical Correlation of the above findings indicates diffuse cerebral dysfunction that is non-specific in etiology and can be seen with hypoxic/ischemic injury, toxic/metabolic encephalopathies, or medication effect from Propofol. Right arm/shoulder twitching did not show any epileptiform correlate, however simple partial seizures can have scalp negative EEG. Clinical correlation is advised.   Ellouise Newer, M.D.

## 2017-04-01 NOTE — Procedures (Signed)
Extubation Procedure Note  Patient Details:   Name: Courtney Blackburn DOB: Jul 04, 1956 MRN: [id number]   Airway Documentation:  Airway 7.5 mm (Active)  Secured at (cm) 24 cm 04/01/2017  8:02 AM  Measured From Lips 04/01/2017  8:02 AM  Secured Location Right 04/01/2017  8:02 AM  Secured By [employer] Tube Holder 04/01/2017  8:02 AM  Tube Holder Repositioned Yes 04/01/2017  8:02 AM  Cuff Pressure (cm H2O) 24 cm H2O 04/01/2017  8:02 AM  Site Condition Dry 04/01/2017  8:00 AM    Evaluation  O2 sats: stable throughout and currently acceptable Complications: No apparent complications Patient did tolerate procedure well. Bilateral Breath Sounds: Clear, Diminished   Yes  Antoine Pocherogdon, Leonte Horrigan Caroline 04/01/2017, 11:16 AM

## 2017-04-01 NOTE — Progress Notes (Signed)
Patients daughter here to stay with patient tonight. Daughter is stating she wants to take a shower, she was informed that the we do not encourage this and had to advise her that the hospital is not held responsible for slips, falls and injury. Charge nurse was made aware of the suturation.
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
## 2017-04-01 NOTE — Progress Notes (Signed)
Pt removed both of her IVs at this time. RN placed new PIV in right hand.

## 2017-04-02 ENCOUNTER — Inpatient Hospital Stay (HOSPITAL_COMMUNITY): Payer: Self-pay

## 2017-04-02 LAB — CBC
HEMATOCRIT: 37.4 % (ref 36.0–46.0)
Hemoglobin: 11.6 g/dL — ABNORMAL LOW (ref 12.0–15.0)
MCH: 30.4 pg (ref 26.0–34.0)
MCHC: 31 g/dL (ref 30.0–36.0)
MCV: 98.2 fL (ref 78.0–100.0)
Platelets: 216 10*3/uL (ref 150–400)
RBC: 3.81 MIL/uL — ABNORMAL LOW (ref 3.87–5.11)
RDW: 15.1 % (ref 11.5–15.5)
WBC: 15.7 10*3/uL — AB (ref 4.0–10.5)

## 2017-04-02 LAB — BASIC METABOLIC PANEL
ANION GAP: 10 (ref 5–15)
BUN: 13 mg/dL (ref 6–20)
CO2: 28 mmol/L (ref 22–32)
Calcium: 8.8 mg/dL — ABNORMAL LOW (ref 8.9–10.3)
Chloride: 101 mmol/L (ref 101–111)
Creatinine, Ser: 0.89 mg/dL (ref 0.44–1.00)
GFR calc Af Amer: >60 mL/min (ref >60)
Glucose, Bld: 97 mg/dL (ref 65–99)
POTASSIUM: 4.8 mmol/L (ref 3.5–5.1)
SODIUM: 139 mmol/L (ref 135–145)

## 2017-04-02 LAB — PHOSPHORUS: PHOSPHORUS: 3.5 mg/dL (ref 2.5–4.6)

## 2017-04-02 LAB — GLUCOSE, CAPILLARY
GLUCOSE-CAPILLARY: 102 mg/dL — AB (ref 65–99)
GLUCOSE-CAPILLARY: 130 mg/dL — AB (ref 65–99)
GLUCOSE-CAPILLARY: 225 mg/dL — AB (ref 65–99)
Glucose-Capillary: 135 mg/dL — ABNORMAL HIGH (ref 65–99)
Glucose-Capillary: 92 mg/dL (ref 65–99)

## 2017-04-02 LAB — MAGNESIUM: Magnesium: 2.4 mg/dL (ref 1.7–2.4)

## 2017-04-02 MED ORDER — MOMETASONE FURO-FORMOTEROL FUM 100-5 MCG/ACT IN AERO
2.0000 | INHALATION_SPRAY | Freq: Two times a day (BID) | RESPIRATORY_TRACT | Status: DC
  Filled 2017-04-02 (×2): qty 8.8

## 2017-04-02 MED ORDER — INSULIN ASPART 100 UNIT/ML ~~LOC~~ SOLN: 0.0000 [IU] | Freq: Every day | SUBCUTANEOUS | Status: DC

## 2017-04-02 MED ORDER — PANTOPRAZOLE SODIUM 40 MG PO TBEC
40.0000 mg | DELAYED_RELEASE_TABLET | Freq: Every day | ORAL | Status: DC
  Administered 2017-04-02 – 2017-04-03 (×2): 40 mg via ORAL
  Filled 2017-04-02 (×2): qty 1

## 2017-04-02 MED ORDER — ALBUTEROL SULFATE (2.5 MG/3ML) 0.083% IN NEBU: 2.5000 mg | INHALATION_SOLUTION | RESPIRATORY_TRACT | Status: DC | PRN

## 2017-04-02 MED ORDER — LEVOTHYROXINE SODIUM 25 MCG PO TABS
25.0000 ug | ORAL_TABLET | Freq: Every day | ORAL | Status: DC
  Administered 2017-04-02 – 2017-04-03 (×2): 25 ug via ORAL
  Filled 2017-04-02 (×2): qty 1

## 2017-04-02 MED ORDER — TIOTROPIUM BROMIDE MONOHYDRATE 18 MCG IN CAPS
18.0000 ug | ORAL_CAPSULE | Freq: Every day | RESPIRATORY_TRACT | Status: DC
  Filled 2017-04-02 (×2): qty 5

## 2017-04-02 MED ORDER — PREDNISONE 20 MG PO TABS
20.0000 mg | ORAL_TABLET | Freq: Every day | ORAL | Status: DC
  Administered 2017-04-02 – 2017-04-03 (×2): 20 mg via ORAL
  Filled 2017-04-02 (×2): qty 1

## 2017-04-02 MED ORDER — INSULIN ASPART 100 UNIT/ML ~~LOC~~ SOLN
0.0000 [IU] | Freq: Three times a day (TID) | SUBCUTANEOUS | Status: DC
  Administered 2017-04-02: 2 [IU] via SUBCUTANEOUS
  Administered 2017-04-02: 5 [IU] via SUBCUTANEOUS
  Administered 2017-04-02: 2 [IU] via SUBCUTANEOUS

## 2017-04-02 NOTE — Progress Notes (Signed)
Report called to Erin, RN at this time.

## 2017-04-02 NOTE — Progress Notes (Signed)
RN 2nd attempt to call report on pt going to 5W.

## 2017-04-02 NOTE — Progress Notes (Addendum)
PULMONARY / CRITICAL CARE MEDICINE   Name: Mateo FlowSandra J Horn MRN: [id number] DOB: 05/17/1956    ADMISSION DATE:  03/30/2017 CONSULTATION DATE:  03/31/2017  REFERRING MD :  Dr. Beacher MayGardener  CHIEF COMPLAINT:  SOB  HPI: 61 yo female smoker presented to ER with dyspnea, cough and altered mental status.  Found to have PNA, AECOPD, and VDRF.  She has hx of COPD, depression, and chronic narcotic use.  SUBJECTIVE:  Wants something to ear.  VITAL SIGNS: Temp:  [97.4 F (36.3 C)-98.9 F (37.2 C)] 98.2 F (36.8 C) (06/05 0000) Pulse Rate:  [81-116] 114 (06/05 0500) Resp:  [9-25] 17 (06/05 0500) BP: (107-164)/(54-124) 146/91 (06/05 0500) SpO2:  [86 %-100 %] 90 % (06/05 0716) FiO2 (%):  [30 %-40 %] 40 % (06/04 0802) Weight:  [167 lb 1.7 oz (75.8 kg)] 167 lb 1.7 oz (75.8 kg) (06/05 0500)   INTAKE / OUTPUT: I/O last 3 completed shifts: In: 1223.5 [P.O.:240; I.V.:323.5; NG/GT:60; IV Piggyback:600] Out: 4275 [Urine:4275]  General - pleasant Eyes - pupils reactive ENT - no sinus tenderness, no oral exudate, no LAN Cardiac - regular, no murmur Chest - no wheeze, rales Abd - soft, non tender Ext - no edema Skin - no rashes Neuro - normal strength Psych - normal mood  LABS:  CBC  Recent Labs Lab 03/31/17 0437 04/01/17 0304 04/02/17 0448  WBC 7.8 13.9* 15.7*  HGB 13.4 11.9* 11.6*  HCT 42.9 37.1 37.4  PLT 193 201 216   Coag's  Recent Labs Lab 03/31/17 0437  INR 1.06   BMET  Recent Labs Lab 03/31/17 1640 04/01/17 0304 04/02/17 0448  NA 137 133* 139  K 3.1* 3.7 4.8  CL 100* 98* 101  CO2 22 23 28   BUN 9 10 13   CREATININE 1.00 0.97 0.89  GLUCOSE 187* 231* 97   Electrolytes  Recent Labs Lab 03/31/17 1640 04/01/17 0304 04/02/17 0448  CALCIUM 8.9 8.5* 8.8*  MG 2.4 2.0 2.4  PHOS 1.4* 3.7 3.5   Sepsis Markers  Recent Labs Lab 03/30/17 1810 03/30/17 2133  LATICACIDVEN 0.95 <0.30*   ABG  Recent Labs Lab 03/30/17 2328 03/31/17 0110 03/31/17 0411   PHART 7.257* 7.261* 7.477*  PCO2ART 76.5* 75.1* 40.7  PO2ART 82.0* 69.0* 138.0*   Liver Enzymes  Recent Labs Lab 03/29/17 1649 03/30/17 1801  AST 19 23  ALT 15 16  ALKPHOS 82 85  BILITOT 0.2 0.6  ALBUMIN 4.1 3.8   Cardiac Enzymes No results for input(s): TROPONINI, PROBNP in the last 168 hours. Glucose  Recent Labs Lab 04/01/17 0934 04/01/17 1132 04/01/17 1655 04/01/17 1944 04/01/17 2307 04/02/17 0412  GLUCAP 112* 112* 132* 123* 122* 102*    Lab Results  Component Value Date   TSH CANCELED 03/29/2017    Imaging Dg Chest Port 1 View  Result Date: 04/01/2017 CLINICAL DATA:  Acute respiratory failure with hypoxia. EXAM: PORTABLE CHEST 1 VIEW COMPARISON:  03/31/2017 FINDINGS: Endotracheal tube tip is 3 cm above the carina. Nasogastric tube enters the stomach. Line artifact overlies the chest. Persistent mild basilar infiltrate/volume loss, slightly worsened radiographically. No qualitatively new finding. IMPRESSION: Slight radiographic worsening of patchy pneumonia in both lower lobes. Electronically Signed   By: Paulina FusiMark  Shogry M.D.   On: 04/01/2017 06:59      STUDIES: CT Head 6/1 > ?subtle gyral edema in the right occipital lobe EEG 6/4 > moderate diffuse slowing  CULTURES: Blood 6/03 >>  ANTIBIOTICS: Rocephin 6/02 >> Zithromax 6/02 >>  LINES/TUBES: ETT 6/02 >>  6/04  ASSESSMENT / PLAN:  Acute on chronic hypoxic/hypercapnic respiratory failure from AECOPD and LLL CAP.- Tobacco abuse. - change to dulera, spiriva 6/05 - day 4 of Abx - prn albuterol - oxygen to keep SpO2 > 92% - bronchial hygiene - change to prednisone 20 mg daily on 6/05 and wean off as tolerated  Steroid induced hyperglycemia. - SSI while on steroids  Hypothyroidism. - add synthroid 6/05  Acute metabolic encephalopathy. Hx of depression, narcotic use. - continue cymbalta - try to minimize narcotic use  ?subtle gyral edema in the right occipital lobe. - can f/u as outpt with  PCP and determine if MRI brain needed  Hypokalemia. - f/u BMET  DVT prophylaxis - lovenox SUP - protonix Nutrition - heart healthy diet Goals of care - Full code  Transfer to med surg bed 6/05 >> To [facility] 6/06 and PCCM off.  Updated pt's daughter at bedside.  Coralyn Helling, MD [facility] Pulmonary/Critical Care 04/02/2017, 7:34 AM Pager:  [phone] After 3pm call: [phone]

## 2017-04-03 ENCOUNTER — Telehealth: Payer: Self-pay | Admitting: Internal Medicine

## 2017-04-03 ENCOUNTER — Inpatient Hospital Stay (HOSPITAL_COMMUNITY): Payer: Self-pay

## 2017-04-03 DIAGNOSIS — J9801 Acute bronchospasm: Secondary | ICD-10-CM

## 2017-04-03 DIAGNOSIS — J9601 Acute respiratory failure with hypoxia: Secondary | ICD-10-CM

## 2017-04-03 LAB — BASIC METABOLIC PANEL
Anion gap: 9 (ref 5–15)
BUN: 12 mg/dL (ref 6–20)
CHLORIDE: 102 mmol/L (ref 101–111)
CO2: 28 mmol/L (ref 22–32)
Calcium: 8.6 mg/dL — ABNORMAL LOW (ref 8.9–10.3)
Creatinine, Ser: 0.89 mg/dL (ref 0.44–1.00)
GFR calc non Af Amer: >60 mL/min (ref >60)
Glucose, Bld: 84 mg/dL (ref 65–99)
POTASSIUM: 3.6 mmol/L (ref 3.5–5.1)
SODIUM: 139 mmol/L (ref 135–145)

## 2017-04-03 LAB — GLUCOSE, CAPILLARY
Glucose-Capillary: 77 mg/dL (ref 65–99)
Glucose-Capillary: 110 mg/dL — ABNORMAL HIGH (ref 65–99)

## 2017-04-03 LAB — CBC
HCT: 35.5 % — ABNORMAL LOW (ref 36.0–46.0)
Hemoglobin: 11 g/dL — ABNORMAL LOW (ref 12.0–15.0)
MCH: 30.5 pg (ref 26.0–34.0)
MCHC: 31 g/dL (ref 30.0–36.0)
MCV: 98.3 fL (ref 78.0–100.0)
Platelets: 204 10*3/uL (ref 150–400)
RBC: 3.61 MIL/uL — ABNORMAL LOW (ref 3.87–5.11)
RDW: 14.6 % (ref 11.5–15.5)
WBC: 16.4 10*3/uL — ABNORMAL HIGH (ref 4.0–10.5)

## 2017-04-03 MED ORDER — ALBUTEROL SULFATE HFA 108 (90 BASE) MCG/ACT IN AERS: 2.0000 | INHALATION_SPRAY | RESPIRATORY_TRACT | 4 refills | Status: DC | PRN

## 2017-04-03 MED ORDER — AZITHROMYCIN 500 MG PO TABS: 500.0000 mg | ORAL_TABLET | Freq: Every day | ORAL | Status: DC

## 2017-04-03 MED ORDER — CEFPODOXIME PROXETIL 200 MG PO TABS: 200.0000 mg | ORAL_TABLET | Freq: Two times a day (BID) | ORAL | 0 refills | Status: DC

## 2017-04-03 MED ORDER — INSULIN ASPART 100 UNIT/ML ~~LOC~~ SOLN: 0.0000 [IU] | Freq: Three times a day (TID) | SUBCUTANEOUS | Status: DC

## 2017-04-03 MED ORDER — CEFPODOXIME PROXETIL 200 MG PO TABS: 200.0000 mg | ORAL_TABLET | Freq: Two times a day (BID) | ORAL | Status: DC

## 2017-04-03 MED ORDER — AZITHROMYCIN 500 MG PO TABS
500.0000 mg | ORAL_TABLET | Freq: Every day | ORAL | 0 refills | Status: DC
Start: 2017-04-03 — End: 2017-09-15

## 2017-04-03 MED ORDER — NICOTINE 21 MG/24HR TD PT24: 21.0000 mg | MEDICATED_PATCH | Freq: Every day | TRANSDERMAL | 3 refills | Status: DC

## 2017-04-03 MED ORDER — LOSARTAN POTASSIUM-HCTZ 100-12.5 MG PO TABS: 0.5000 | ORAL_TABLET | Freq: Every day | ORAL | 1 refills | Status: DC

## 2017-04-03 MED ORDER — PREDNISONE 10 MG PO TABS: ORAL_TABLET | ORAL | 0 refills | Status: DC

## 2017-04-03 MED ORDER — PANTOPRAZOLE SODIUM 40 MG PO TBEC: 40.0000 mg | DELAYED_RELEASE_TABLET | Freq: Every day | ORAL | 0 refills | Status: DC

## 2017-04-03 MED ORDER — TIOTROPIUM BROMIDE MONOHYDRATE 18 MCG IN CAPS: 18.0000 ug | ORAL_CAPSULE | Freq: Every day | RESPIRATORY_TRACT | 12 refills | Status: DC

## 2017-04-03 MED ORDER — LEVOTHYROXINE SODIUM 25 MCG PO TABS: 25.0000 ug | ORAL_TABLET | Freq: Every day | ORAL | 3 refills | Status: DC

## 2017-04-03 MED ORDER — NICOTINE 21 MG/24HR TD PT24
21.0000 mg | MEDICATED_PATCH | Freq: Every day | TRANSDERMAL | Status: DC
  Administered 2017-04-03: 21 mg via TRANSDERMAL
  Filled 2017-04-03: qty 1

## 2017-04-03 MED ORDER — MOMETASONE FURO-FORMOTEROL FUM 100-5 MCG/ACT IN AERO: 2.0000 | INHALATION_SPRAY | Freq: Two times a day (BID) | RESPIRATORY_TRACT | 4 refills | Status: DC

## 2017-04-03 NOTE — Evaluation (Addendum)
Physical Therapy Evaluation Patient Details Name: Courtney Blackburn MRN: [id number] DOB: 05-26-1956 Today's Date: 04/03/2017   History of Present Illness  61 yo female smoker presented to ER with dyspnea, cough and altered mental status.  Found to have PNA, AECOPD, and VDRF.  She has hx of COPD, depression, and chronic narcotic use.  Clinical Impression  Patient presents currently without skilled PT needs.  She is independent with mobility, but cautioned against returning to full activities initially upon d/c as she is very active working and taking care of her grandaughter.  Noted some drop in SpO2 at rest on RA, (87%), but maintains in low 90's with ambulation.  No further skilled PT needs at this time.     Follow Up Recommendations No PT follow up    Equipment Recommendations  None recommended by PT    Recommendations for Other Services       Precautions / Restrictions Precautions Precautions: None Restrictions Weight Bearing Restrictions: No      Mobility  Bed Mobility Overal bed mobility: Modified Independent                Transfers Overall transfer level: Modified independent                  Ambulation/Gait Ambulation/Gait assistance: Independent Ambulation Distance (Feet): 200 Feet Assistive device: None Gait Pattern/deviations: Step-through pattern;Decreased stride length     General Gait Details: no LOB, no physical help needed  Stairs            Wheelchair Mobility    Modified Rankin (Stroke Patients Only)       Balance Overall balance assessment: No apparent balance deficits (not formally assessed)                                           Pertinent Vitals/Pain Pain Assessment: 0-10 Pain Score: 7  Pain Location: lower back, chronic Pain Descriptors / Indicators: Aching Pain Intervention(s): Monitored during session    Home Living Family/patient expects to be discharged to:: Private residence Living  Arrangements: Children Available Help at Discharge: Family;Available PRN/intermittently Type of Home: House Home Access: Stairs to enter Entrance Stairs-Rails: None (posts on both sides) Secretary/administrator of Steps: 3 Home Layout: One level Home Equipment: None      Prior Function Level of Independence: Independent         Comments: works in Research scientist (physical sciences) at [employer]        Extremity/Trunk Assessment   Upper Extremity Assessment Upper Extremity Assessment: Overall WFL for tasks assessed    Lower Extremity Assessment Lower Extremity Assessment: Overall WFL for tasks assessed       Communication   Communication: No difficulties  Cognition Arousal/Alertness: Awake/alert Behavior During Therapy: WFL for tasks assessed/performed Overall Cognitive Status: Within Functional Limits for tasks assessed                                        General Comments General comments (skin integrity, edema, etc.): SpO2 on RA at rest dropped to 90 then 87, with ambulation 90-92% on RA.  RN aware, left on RA at rest    Exercises     Assessment/Plan    PT Assessment Patent does not need any further PT services  PT Problem List         PT Treatment Interventions      PT Goals (Current goals can be found in the Care Plan section)  Acute Rehab PT Goals PT Goal Formulation: All assessment and education complete, DC therapy    Frequency     Barriers to discharge        Co-evaluation               AM-PAC PT "6 Clicks" Daily Activity  Outcome Measure Difficulty turning over in bed (including adjusting bedclothes, sheets and blankets)?: None Difficulty moving from lying on back to sitting on the side of the bed? : None Difficulty sitting down on and standing up from a chair with arms (e.g., wheelchair, bedside commode, etc,.)?: None Help needed moving to and from a bed to chair (including a wheelchair)?: None Help needed walking  in hospital room?: None Help needed climbing 3-5 steps with a railing? : A Little 6 Click Score: 23    End of Session Equipment Utilized During Treatment: Gait belt Activity Tolerance: Patient tolerated treatment well Patient left: in bed;with call bell/phone within reach Nurse Communication: Other (comment) (did not set bed alarm as pt independent with mobility) PT Visit Diagnosis: Other abnormalities of gait and mobility (R26.89)    Time: 1035-1105 PT Time Calculation (min) (ACUTE ONLY): 30 min   Charges:   PT Evaluation $PT Eval Low Complexity: 1 Procedure PT Treatments $Gait Training: 8-22 mins   PT G CodesSheran Blackburn:        Courtney Blackburn, [place] [phone] 04/03/2017   Elray Mcgregorynthia Cerise Lieber 04/03/2017, 11:17 AM

## 2017-04-03 NOTE — Discharge Summary (Signed)
Physician Discharge Summary   Patient ID: Courtney Blackburn MRN: [id number] DOB/AGE: 04/22/1956 61 y.o.  Admit date: 03/30/2017 Discharge date: 04/03/2017  Primary Care Physician:  Sherren Mocha, MD  Discharge Diagnoses:   . Acute hypercapnic respiratory failure (HCC) . COPD with acute exacerbation (HCC) . Community acquired pneumonia of left lower lobe of lung (HCC) . Acute hypercapnic respiratory failure (HCC) . Essential hypertension . Tobacco abuse   hypothyroidism   Consults:  Pulmonary critical care   Recommendations for Outpatient Follow-up:  1. Please repeat CBC/BMET at next visit 2. Subtle gyral edema in the right occipital lobe - CT head showed a questionable subtle gyral edema in the right occipital lobe otherwise no acute abnormalities. Outpatient follow-up with PCP and determine if MRI brain as needed    DIET: heart healthy diet      Allergies:   Allergies  Allergen Reactions  . Codeine Other (See Comments)    headaches     DISCHARGE MEDICATIONS: Current Discharge Medication List    START taking these medications   Details  azithromycin (ZITHROMAX) 500 MG tablet Take 1 tablet (500 mg total) by mouth daily. X 5 days Qty: 5 tablet, Refills: 0    cefpodoxime (VANTIN) 200 MG tablet Take 1 tablet (200 mg total) by mouth 2 (two) times daily. X 5 days Qty: 10 tablet, Refills: 0    levothyroxine (SYNTHROID, LEVOTHROID) 25 MCG tablet Take 1 tablet (25 mcg total) by mouth daily before breakfast. Qty: 30 tablet, Refills: 3    mometasone-formoterol (DULERA) 100-5 MCG/ACT AERO Inhale 2 puffs into the lungs 2 (two) times daily. Qty: 1 Inhaler, Refills: 4    nicotine (NICODERM CQ - DOSED IN MG/24 HOURS) 21 mg/24hr patch Place 1 patch (21 mg total) onto the skin daily. Qty: 28 patch, Refills: 3    pantoprazole (PROTONIX) 40 MG tablet Take 1 tablet (40 mg total) by mouth daily. Qty: 30 tablet, Refills: 0    predniSONE (DELTASONE) 10 MG tablet Prednisone  dosing: Take  Prednisone 20mg  (2 tabs) x 3days, then 10mg  (1 tab) x 3days, then OFF. Qty: 9 tablet, Refills: 0    tiotropium (SPIRIVA) 18 MCG inhalation capsule Place 1 capsule (18 mcg total) into inhaler and inhale daily. Qty: 30 capsule, Refills: 12      CONTINUE these medications which have CHANGED   Details  albuterol (PROVENTIL HFA;VENTOLIN HFA) 108 (90 Base) MCG/ACT inhaler Inhale 2 puffs into the lungs every 4 (four) hours as needed for wheezing or shortness of breath (cough, shortness of breath or wheezing.). Qty: 1 Inhaler, Refills: 4   Associated Diagnoses: Bronchospasm    losartan-hydrochlorothiazide (HYZAAR) 100-12.5 MG tablet Take 0.5 tablets by mouth daily. Qty: 30 tablet, Refills: 1   Associated Diagnoses: Essential hypertension      CONTINUE these medications which have NOT CHANGED   Details  Aspirin-Salicylamide-Caffeine (BC HEADACHE POWDER PO) Take 1 packet by mouth 4 (four) times daily as needed (pain/headache).    DULoxetine (CYMBALTA) 60 MG capsule Take 1 capsule (60 mg total) by mouth daily. Qty: 90 capsule, Refills: 1   Associated Diagnoses: Moderate episode of recurrent major depressive disorder (HCC)    Multiple Vitamin (MULTIVITAMIN WITH MINERALS) TABS tablet Take 1 tablet by mouth daily.    omeprazole (PRILOSEC OTC) 20 MG tablet Take 20 mg by mouth daily.    OVER THE COUNTER MEDICATION Apply 1 application topically 3 (three) times daily as needed (rash under breasts). OTC antifungal cream      STOP  taking these medications     levofloxacin (LEVAQUIN) 750 MG tablet          Brief H and P: For complete details please refer to admission H and P, but in brief, patient is a 61 year old female with a history of COPD, smoking, chronic narcotic use, presented to ER with dyspnea cough, altered mental status. Patient was found to have pneumonia, acute exacerbation of COPD and acute hypoxic respiratory distress. ABG showed pH of 7.26 with a PCO2 of 75.  Patient remained difficult to arouse and needed intubation in ED.  patient was then subsequently admitted to ICU by critical care service. She was extubated on 6/4.  Hospital Course:  Acute on chronic hypoxic/hypercapnic respiratory failure from acute exacerbation of COPD and LLL community-acquired pneumonia - Patient was found to have pneumonia, acute exacerbation of COPD and acute hypoxic respiratory distress. ABG showed pH of 7.26 with a PCO2 of 75. Patient remained difficult to arouse and needed intubation in ED. she was extubated on 6/4 - Patient was placed on IV Zithromax, Rocephin by critical care service, steroids.  -Patient was placed on DuoNeb's, dulera.  - Patient has significantly improved - Patient is now transitioned to oral Zithromax and Vantin for 5 days to complete the full course of antibiotics - PT evaluation recommended no PT follow-up. Independent with mobility. Per PT, some drop in SpO2 at rest on room air 87% but maintains a low 90s with ambulation.   Hypothyroidism - Synthroid was added on 6/5, TSH 6.2   Acute metabolic encephalopathy - Resolved, patient has a history of depression and narcotic use. Patient was continued on Cymbalta and minimize narcotic use  - EEG is abnormal  due to moderate diffuse slowing of the background due to encephalopathy, nonspecific   Subtle gyral edema in the right occipital lobe - CT head showed a questionable subtle gyral edema in the right occipital lobe otherwise no acute abnormalities. Outpatient follow-up with PCP and determine if MRI brain as needed   Hypokalemia - Replaced   Tobacco abuse - Patient was counseled strongly on tobacco cessation and given prescription for nicotine patches   Day of Discharge BP (!) 109/51 (BP Location: Left Arm)   Pulse 75   Temp 98.6 F (37 C)   Resp 18   Ht 5\' 2"  (1.575 m)   Wt 74.1 kg (163 lb 4.8 oz)   LMP 10/02/2012   SpO2 97%   BMI 29.87 kg/m   Physical Exam: General: Alert and  awake oriented x3 not in any acute distress. HEENT: anicteric sclera, pupils reactive to light and accommodation CVS: S1-S2 clear no murmur rubs or gallops Chest: clear to auscultation bilaterally, no wheezing rales or rhonchi Abdomen: soft nontender, nondistended, normal bowel sounds Extremities: no cyanosis, clubbing or edema noted bilaterally Neuro: Cranial nerves II-XII intact, no focal neurological deficits   The results of significant diagnostics from this hospitalization (including imaging, microbiology, ancillary and laboratory) are listed below for reference.    LAB RESULTS: Basic Metabolic Panel:  Recent Labs Lab 04/02/17 0448 04/03/17 0413  NA 139 139  K 4.8 3.6  CL 101 102  CO2 28 28  GLUCOSE 97 84  BUN 13 12  CREATININE 0.89 0.89  CALCIUM 8.8* 8.6*  MG 2.4  --   PHOS 3.5  --    Liver Function Tests:  Recent Labs Lab 03/29/17 1649 03/30/17 1801  AST 19 23  ALT 15 16  ALKPHOS 82 85  BILITOT 0.2 0.6  PROT  6.9 7.6  ALBUMIN 4.1 3.8   No results for input(s): LIPASE, AMYLASE in the last 168 hours. No results for input(s): AMMONIA in the last 168 hours. CBC:  Recent Labs Lab 03/30/17 1801  04/02/17 0448 04/03/17 0413  WBC 6.5  < > 15.7* 16.4*  NEUTROABS 3.5  --   --   --   HGB 12.9  < > 11.6* 11.0*  HCT 41.6  < > 37.4 35.5*  MCV 97.9  < > 98.2 98.3  PLT 215  < > 216 204  < > = values in this interval not displayed. Cardiac Enzymes: No results for input(s): CKTOTAL, CKMB, CKMBINDEX, TROPONINI in the last 168 hours. BNP: Invalid input(s): POCBNP CBG:  Recent Labs Lab 04/03/17 0842 04/03/17 1208  GLUCAP 77 110*    Significant Diagnostic Studies:  Dg Chest 2 View  Result Date: 03/30/2017 CLINICAL DATA:  Patient with cough for 6 months.  Lethargy. EXAM: CHEST  2 VIEW COMPARISON:  Chest radiograph 10/30/2015. FINDINGS: Monitoring leads overlie the patient. Stable cardiomegaly. Patchy consolidation within the left lower hemithorax. No pleural  effusion or pneumothorax. Thoracic spine degenerative changes. IMPRESSION: Patchy consolidation within the left lower hemithorax concerning for pneumonia in the appropriate clinical setting. Followup PA and lateral chest X-ray is recommended in 3-4 weeks following trial of antibiotic therapy to ensure resolution and exclude underlying malignancy. Electronically Signed   By: Annia Belt M.D.   On: 03/30/2017 18:45   Dg Chest Portable 1 View  Result Date: 03/31/2017 CLINICAL DATA:  Intubation EXAM: PORTABLE CHEST 1 VIEW COMPARISON:  03/30/2017 FINDINGS: Interval placement of endotracheal tube, the tip is about 4.3 cm superior to the carina. Patchy infiltrate at the left lung base. No large effusion. Stable cardiomediastinal silhouette. Esophageal tube tip is below the diaphragm but not included on the image. IMPRESSION: Endotracheal tube tip approximately 4.3 cm superior to the carina. Patchy infiltrate at the left lung base. Electronically Signed   By: Jasmine Pang M.D.   On: 03/31/2017 03:05   Dg Abd Portable 1 View  Result Date: 03/31/2017 CLINICAL DATA:  OG tube placement EXAM: PORTABLE ABDOMEN - 1 VIEW COMPARISON:  None. FINDINGS: Tip of the esophageal tube is in the left upper quadrant over the proximal stomach, side-port in the region of GE junction. Gas pattern unremarkable. IMPRESSION: Esophageal tube tip in the left upper quadrant over the proximal stomach, side-port overlies the GE junction, consider further advancement by several cm for more optimal position. Electronically Signed   By: Jasmine Pang M.D.   On: 03/31/2017 03:06    2D ECHO:   Disposition and Follow-up: Discharge Instructions    Diet Carb Modified    Complete by:  As directed    Increase activity slowly    Complete by:  As directed        DISPOSITION: Home   DISCHARGE FOLLOW-UP Follow-up Information    Sherren Mocha, MD. Schedule an appointment as soon as possible for a visit in 2 week(s).   Specialty:  Family  Medicine Contact information: [address] [place] [address] [phone]        Bevelyn Ngo, NP Follow up on 04/17/2017.   Specialty:  Pulmonary Disease Why:  at 3:45PM  Contact information: [address] [place] [address] [phone]            Time spent on Discharge: 40 minutes  Signed:   Thad Ranger M.D. [facility] 04/03/2017, 12:38 PM Pager: [phone]

## 2017-04-03 NOTE — Progress Notes (Signed)
Nsg Discharge Note  Admit Date:  03/30/2017 Discharge date: 04/03/2017   Courtney Blackburn Basic to be D/C'd Home per MD order.  AVS completed.  Copy for chart, and copy for patient signed, and dated. Patient/caregiver able to verbalize understanding.  Discharge Medication: Allergies as of 04/03/2017      Reactions   Codeine Other (See Comments)   headaches      Medication List    STOP taking these medications   levofloxacin 750 MG tablet Commonly known as:  LEVAQUIN     TAKE these medications   albuterol 108 (90 Base) MCG/ACT inhaler Commonly known as:  PROVENTIL HFA;VENTOLIN HFA Inhale 2 puffs into the lungs every 4 (four) hours as needed for wheezing or shortness of breath (cough, shortness of breath or wheezing.). What changed:  reasons to take this   azithromycin 500 MG tablet Commonly known as:  ZITHROMAX Take 1 tablet (500 mg total) by mouth daily. X 5 days   BC HEADACHE POWDER PO Take 1 packet by mouth 4 (four) times daily as needed (pain/headache).   cefpodoxime 200 MG tablet Commonly known as:  VANTIN Take 1 tablet (200 mg total) by mouth 2 (two) times daily. X 5 days   DULoxetine 60 MG capsule Commonly known as:  CYMBALTA Take 1 capsule (60 mg total) by mouth daily.   levothyroxine 25 MCG tablet Commonly known as:  SYNTHROID, LEVOTHROID Take 1 tablet (25 mcg total) by mouth daily before breakfast. Start taking on:  04/04/2017   losartan-hydrochlorothiazide 100-12.5 MG tablet Commonly known as:  HYZAAR Take 0.5 tablets by mouth daily.   mometasone-formoterol 100-5 MCG/ACT Aero Commonly known as:  DULERA Inhale 2 puffs into the lungs 2 (two) times daily.   multivitamin with minerals Tabs tablet Take 1 tablet by mouth daily.   nicotine 21 mg/24hr patch Commonly known as:  NICODERM CQ - dosed in mg/24 hours Place 1 patch (21 mg total) onto the skin daily.   omeprazole 20 MG tablet Commonly known as:  PRILOSEC OTC Take 20 mg by mouth daily.   OVER THE  COUNTER MEDICATION Apply 1 application topically 3 (three) times daily as needed (rash under breasts). OTC antifungal cream   pantoprazole 40 MG tablet Commonly known as:  PROTONIX Take 1 tablet (40 mg total) by mouth daily.   predniSONE 10 MG tablet Commonly known as:  DELTASONE Prednisone dosing: Take  Prednisone 20mg  (2 tabs) x 3days, then 10mg  (1 tab) x 3days, then OFF.   tiotropium 18 MCG inhalation capsule Commonly known as:  SPIRIVA Place 1 capsule (18 mcg total) into inhaler and inhale daily.       Discharge Assessment: Vitals:   04/02/17 2150 04/03/17 0641  BP: 134/70 (!) 109/51  Pulse: 71 75  Resp: 18 18  Temp: 99.5 F (37.5 C) 98.6 F (37 C)   Skin clean, dry and intact without evidence of skin break down, no evidence of skin tears noted. IV catheter discontinued intact. Site without signs and symptoms of complications - no redness or edema noted at insertion site, patient denies c/o pain - only slight tenderness at site.  Dressing with slight pressure applied.  D/c Instructions-Education: Discharge instructions given to patient/family with verbalized understanding. D/c education completed with patient/family including follow up instructions, medication list, d/c activities limitations if indicated, with other d/c instructions as indicated by MD - patient able to verbalize understanding, all questions fully answered. Patient instructed to return to ED, call 911, or call MD for any changes  in condition.  Patient escorted via WC, and D/C home via private auto.  Courtney FlamingVicki L Leno Mathes, RN 04/03/2017 2:22 PM

## 2017-04-04 LAB — HEMOGLOBIN A1C
Hgb A1c MFr Bld: 6 % — ABNORMAL HIGH (ref 4.8–5.6)
MEAN PLASMA GLUCOSE: 126 mg/dL

## 2017-04-04 NOTE — Telephone Encounter (Signed)
MR please advise what time you have to leave on Friday 6/8 (pm clinic). Your last pt is at 145 (both OV's). Will pt (consult) need to be seen by another provider or are you able to see her? Thanks.

## 2017-04-04 NOTE — Telephone Encounter (Signed)
Spoke with pt. She has been scheduled with JN on 04/05/17 at 2pm. Nothing further was needed.

## 2017-04-04 NOTE — Telephone Encounter (Signed)
Spoke with MR - unable to see pt d/t having to be out of clinic by 2:15.   Will forward to Triage to work pt in to see a provider.

## 2017-04-05 ENCOUNTER — Inpatient Hospital Stay: Payer: Self-pay | Admitting: Pulmonary Disease

## 2017-04-05 ENCOUNTER — Telehealth: Payer: Self-pay | Admitting: Family Medicine

## 2017-04-05 LAB — CULTURE, BLOOD (ROUTINE X 2)
CULTURE: NO GROWTH
Culture: NO GROWTH
SPECIAL REQUESTS: ADEQUATE
Special Requests: ADEQUATE

## 2017-04-05 NOTE — Telephone Encounter (Signed)
L/m call back and schedule appt

## 2017-04-05 NOTE — Telephone Encounter (Signed)
Pt is sick and needs a prescription  Please advise: [phone]

## 2017-04-06 ENCOUNTER — Ambulatory Visit (INDEPENDENT_AMBULATORY_CARE_PROVIDER_SITE_OTHER): Payer: Self-pay | Admitting: Emergency Medicine

## 2017-04-06 ENCOUNTER — Encounter: Payer: Self-pay | Admitting: Emergency Medicine

## 2017-04-06 VITALS — BP 154/80 | HR 89 | Temp 98.7°F | Resp 16 | Ht 61.5 in | Wt 165.2 lb

## 2017-04-06 DIAGNOSIS — F411 Generalized anxiety disorder: Secondary | ICD-10-CM

## 2017-04-06 DIAGNOSIS — F430 Acute stress reaction: Secondary | ICD-10-CM

## 2017-04-06 MED ORDER — CLONAZEPAM 0.5 MG PO TABS: 0.5000 mg | ORAL_TABLET | Freq: Two times a day (BID) | ORAL | 0 refills | Status: DC | PRN
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
## 2017-04-06 NOTE — Patient Instructions (Addendum)
     IF you received an x-ray today, you will receive an invoice from [facility]. Please contact [facility] at [phone] with questions or concerns regarding your invoice.   IF you received labwork today, you will receive an invoice from [facility]. Please contact [facility] at [phone] with questions or concerns regarding your invoice.   Our billing staff will not be able to assist you with questions regarding bills from these companies.  You will be contacted with the lab results as soon as they are available. The fastest way to get your results is to activate your My Chart account. Instructions are located on the last page of this paperwork. If you have not heard from us regarding the results in 2 weeks, please contact this office.    We recommend that you schedule a mammogram for breast cancer screening. Typically, you do not need a referral to do this. Please contact a local imaging center to schedule your mammogram.  [facility] - [phone]  *ask for the Radiology Department [facility]) - [phone] or [phone]  [facility] - [phone] [facility] - [phone] [facility] - [phone]  *ask for the Radiology Department [facility] - [phone]  *ask for the Radiology Department [facility] - [phone]  *ask for the Mammography Department [facility] - [phone] Panic Attacks Panic attacks are sudden, short feelings of great fear or discomfort. You may have them for no reason when you are relaxed, when you are uneasy (anxious), or when you are sleeping. Follow these instructions at home:  Take all your medicines as told.  Check with your doctor before starting new medicines.  Keep all doctor visits. Contact a doctor if:  You are not able to take your medicines as told.  Your symptoms do not get better.  Your  symptoms get worse. Get help right away if:  Your attacks seem different than your normal attacks.  You have thoughts about hurting yourself or others.  You take panic attack medicine and you have a side effect. This information is not intended to replace advice given to you by your health care provider. Make sure you discuss any questions you have with your health care provider. Document Released: 11/17/2010 Document Revised: 03/22/2016 Document Reviewed: 05/29/2013 Elsevier Interactive Patient Education  2017 Elsevier Inc.  

## 2017-04-10 ENCOUNTER — Telehealth: Payer: Self-pay | Admitting: Family Medicine

## 2017-04-10 NOTE — Telephone Encounter (Signed)
Pt daughter has questions about pt symptoms she is having a sore throat and sluggish and blood pressure is reading low and she has waves of nausea   Best number [phone]

## 2017-04-11 ENCOUNTER — Ambulatory Visit (HOSPITAL_COMMUNITY)
Admission: EM | Admit: 2017-04-11 | Discharge: 2017-04-11 | Disposition: A | Discharge status: Home / Self Care | Payer: Self-pay

## 2017-04-11 ENCOUNTER — Encounter (HOSPITAL_COMMUNITY): Payer: Self-pay | Admitting: *Deleted

## 2017-04-11 DIAGNOSIS — R5383 Other fatigue: Secondary | ICD-10-CM

## 2017-04-11 NOTE — ED Provider Notes (Signed)
CSN: [id number]     Arrival date & time 04/11/17  1932 History   None    Chief Complaint  Patient presents with  . Fatigue   (Consider location/radiation/quality/duration/timing/severity/associated sxs/prior Treatment) Patient recently was hospitalized for pneumona and  respiratory failure due to hypercapnea.  She is worried because she has been sleepy.  She states that she was very sleepy before she had to be admitted and wanted to make sure she was not getting sick again.  She has an appointment for hospital follow up with her PCP tomorrow.    Weakness  This is a recurrent problem. The current episode started yesterday. The problem has not changed since onset.There was no focality noted. There has been no fever.    Past Medical History:  Diagnosis Date  . Acid reflux   . Allergy   . Anxiety   . Depression   . Hiatal hernia   . Hypertension    Past Surgical History:  Procedure Laterality Date  . APPENDECTOMY    . TUBAL LIGATION    . WISDOM TOOTH EXTRACTION     Family History  Problem Relation Age of Onset  . Heart disease Mother   . Heart disease Father   . Cancer Sister   . Cancer Brother    Social History  Substance Use Topics  . Smoking status: Current Every Day Smoker    Packs/day: 1.00    Years: 43.00    Types: Cigarettes  . Smokeless tobacco: Never Used  . Alcohol use No   OB History    Gravida Para Term Preterm AB Living   1         1   SAB TAB Ectopic Multiple Live Births                 Review of Systems  Constitutional: Positive for fatigue.  HENT: Negative.   Eyes: Negative.   Respiratory: Negative.   Cardiovascular: Negative.   Gastrointestinal: Negative.   Endocrine: Negative.   Genitourinary: Negative.   Musculoskeletal: Negative.   Allergic/Immunologic: Negative.   Neurological: Positive for weakness.    Allergies  Codeine  Home Medications   Prior to Admission medications   Medication Sig Start Date End Date Taking?  Authorizing Provider  albuterol (PROVENTIL HFA;VENTOLIN HFA) 108 (90 Base) MCG/ACT inhaler Inhale 2 puffs into the lungs every 4 (four) hours as needed for wheezing or shortness of breath (cough, shortness of breath or wheezing.). 04/03/17   Rai, Delene Ruffiniipudeep K, MD  Aspirin-Salicylamide-Caffeine (BC HEADACHE POWDER PO) Take 1 packet by mouth 4 (four) times daily as needed (pain/headache).    [provider]  azithromycin (ZITHROMAX) 500 MG tablet Take 1 tablet (500 mg total) by mouth daily. X 5 days 04/03/17   Rai, Delene Ruffiniipudeep K, MD  cefpodoxime (VANTIN) 200 MG tablet Take 1 tablet (200 mg total) by mouth 2 (two) times daily. X 5 days 04/03/17   Rai, Delene Ruffiniipudeep K, MD  clonazePAM (KLONOPIN) 0.5 MG tablet Take 1 tablet (0.5 mg total) by mouth 2 (two) times daily as needed for anxiety. 04/06/17   Georgina QuintSagardia, Miguel Jose, MD  DULoxetine (CYMBALTA) 60 MG capsule Take 1 capsule (60 mg total) by mouth daily. 03/15/17   Sherren MochaShaw, Eva N, MD  levothyroxine (SYNTHROID, LEVOTHROID) 25 MCG tablet Take 1 tablet (25 mcg total) by mouth daily before breakfast. 04/04/17   Rai, Delene Ruffiniipudeep K, MD  losartan-hydrochlorothiazide (HYZAAR) 100-12.5 MG tablet Take 0.5 tablets by mouth daily. 04/03/17   Cathren Harshai, Ripudeep K, MD  mometasone-formoterol (DULERA) 100-5 MCG/ACT AERO Inhale 2 puffs into the lungs 2 (two) times daily. 04/03/17   Rai, Delene Ruffini, MD  Multiple Vitamin (MULTIVITAMIN WITH MINERALS) TABS tablet Take 1 tablet by mouth daily.    [provider]  nicotine (NICODERM CQ - DOSED IN MG/24 HOURS) 21 mg/24hr patch Place 1 patch (21 mg total) onto the skin daily. Patient not taking: Reported on 04/06/2017 04/03/17   Rai, Delene Ruffini, MD  omeprazole (PRILOSEC OTC) 20 MG tablet Take 20 mg by mouth daily.    [provider]  OVER THE COUNTER MEDICATION Apply 1 application topically 3 (three) times daily as needed (rash under breasts). OTC antifungal cream    [provider]  pantoprazole (PROTONIX) 40 MG tablet Take 1 tablet  (40 mg total) by mouth daily. 04/03/17   Rai, Delene Ruffini, MD  predniSONE (DELTASONE) 10 MG tablet Prednisone dosing: Take  Prednisone 20mg  (2 tabs) x 3days, then 10mg  (1 tab) x 3days, then OFF. 04/03/17   Rai, Ripudeep K, MD  tiotropium (SPIRIVA) 18 MCG inhalation capsule Place 1 capsule (18 mcg total) into inhaler and inhale daily. 04/03/17   Rai, Delene Ruffini, MD   Meds Ordered and Administered this Visit  Medications - No data to display  BP 136/68 (BP Location: Right Arm)   Pulse 79   Temp 98.8 F (37.1 C) (Oral)   Resp 18   LMP 10/02/2012   SpO2 98%  No data found.   Physical Exam  Constitutional: She is oriented to person, place, and time. She appears well-developed and well-nourished.  HENT:  Head: Normocephalic and atraumatic.  Right Ear: External ear normal.  Left Ear: External ear normal.  Mouth/Throat: Oropharynx is clear and moist.  Eyes: Conjunctivae and EOM are normal. Pupils are equal, round, and reactive to light.  Neck: Normal range of motion. Neck supple.  Cardiovascular: Normal rate, regular rhythm and normal heart sounds.   Pulmonary/Chest: Effort normal and breath sounds normal.  Neurological: She is alert and oriented to person, place, and time.  Nursing note and vitals reviewed.   Urgent Care Course     Procedures (including critical care time)  Labs Review Labs Reviewed - No data to display  Imaging Review No results found.   Visual Acuity Review  Right Eye Distance:   Left Eye Distance:   Bilateral Distance:    Right Eye Near:   Left Eye Near:    Bilateral Near:         MDM   1. Other fatigue    Explained to patient it is not uncommon to be fatigued or tired after DC from hospital especially when she was in the ICU with respiratory failure.  She is alert and oriented and not delirious.  Explained she is nothing like she was prior to hospitalization and reassurance given. Advised to follow up with PCP     Courtney Canter,  FNP 04/11/17 2053

## 2017-04-11 NOTE — ED Notes (Addendum)
NP made aware of patients complaints and history. Patient is able to talk with me in depth regarding her stay and her medications. Patient alert and oriented to situation, was unable to tell me the date. Patient states she just has not been sleeping well.

## 2017-04-11 NOTE — Discharge Instructions (Signed)
Your fatigue is due to catching up on sleep that you didn't get while in hospital.  Rest and eat and drink.  Follow up with PCP for hospital follow up visit.

## 2017-04-11 NOTE — ED Triage Notes (Signed)
Patient states that she was admitted to the hospital on 6/2 for PNA. Patient has had follow up appts since discharge. Patient states that last night she started feeling tired again just like she did before she was admitted. Patient states she slept all day today, states she fell asleep last night while eating.

## 2017-04-11 NOTE — Telephone Encounter (Signed)
lmvm to call us back  

## 2017-04-13 ENCOUNTER — Ambulatory Visit: Payer: Self-pay | Admitting: Family Medicine

## 2017-04-16 ENCOUNTER — Telehealth: Payer: Self-pay | Admitting: Family Medicine

## 2017-04-16 NOTE — Telephone Encounter (Signed)
PT CALLING FOR A RX FOR KLONOPIN SHE ALSO SAYS THAT SHE HAD DROPPED OFF MEDICAL RECORDS FOR DR Clelia CroftSHAW FOR FMLA I SEE NOTHING SCANNED INTO FILES

## 2017-04-17 ENCOUNTER — Telehealth: Payer: Self-pay | Admitting: Emergency Medicine

## 2017-04-17 ENCOUNTER — Inpatient Hospital Stay: Payer: Self-pay | Admitting: Acute Care

## 2017-04-18 DIAGNOSIS — Z0271 Encounter for disability determination: Secondary | ICD-10-CM

## 2017-04-18 NOTE — Telephone Encounter (Signed)
I have her FMLA forms. They have not been done because the last time I saw patient I sent her to the hospital where she was intubated and put into the ICU.  She then no showed her pulmonology appointment on 6/8. I have not yet seen her for her hospital follow-up visit which she no showed on 6/16. I've also not ever prescribed patient Klonopin nor discussed this with her. She told Dr. Alvy BimlerSagardia that she and I had discussed starting her on this which I certainly don't recall so he gave her a limited supply but she is going to need to be seen for any treatment of anxiety.

## 2017-04-18 NOTE — Telephone Encounter (Signed)
Please send to Dr. Clelia CroftShaw.

## 2017-04-18 NOTE — Telephone Encounter (Signed)
I have never discussed this medication with this patient. I wanted her to start on Seroquel. I have refilled a very limited supply but do not think she should be on this medication and and certainly unwilling to refill any further without an office visit to discuss alternatives and safety

## 2017-04-19 ENCOUNTER — Other Ambulatory Visit: Payer: Self-pay | Admitting: Emergency Medicine

## 2017-04-19 NOTE — Telephone Encounter (Signed)
I printed a rx for pt with 5 tabs on Thurs 6/21 evening at 104 and asked that it be faxed to [facility] - put in "to fax" box. Pt needs f/u OV to address her anxiety - i'm not sure this is a good med for her and we need to eval and discuss options.

## 2017-04-22 ENCOUNTER — Telehealth: Payer: Self-pay

## 2017-04-22 NOTE — Telephone Encounter (Signed)
Rx for Klonopin faxed into [facility]. Confirmation fax received on 06.22.2018

## 2017-04-22 NOTE — Telephone Encounter (Signed)
Done

## 2017-04-23 NOTE — Telephone Encounter (Signed)
She has already been called to schedule an apt.

## 2017-04-23 NOTE — Telephone Encounter (Signed)
Pt needs to schedule an appointment with Dr. Clelia CroftShaw to get further medication refills.

## 2017-04-23 NOTE — Telephone Encounter (Signed)
Called patient she states since she has not been working she does not have the money to come in and be seen. She stated she would call the beginning of next week (july 2nd) to schedule something with Dr Clelia CroftShaw.  States she is feeling ok, not 100% but better she doesn't feel like she is in harm anymore. But states she is not avoiding Dr Clelia CroftShaw and she will come in as soon as she gets her check next week.  Her call back number is [phone] if Dr Clelia CroftShaw needs to call her or anything.

## 2017-04-23 NOTE — Telephone Encounter (Signed)
[place] sent over blank FMLA forms to be completed by Dr Clelia CroftShaw for this patient. I have scanned them into the computer but I have not filled them out. I saw your previous note and wanted to ask if you were going to complete them or if you need to see the patient again. I will place the blank forms in your box on 04/23/17 let me know either way about completing them.

## 2017-04-24 NOTE — Telephone Encounter (Signed)
See last note - pt can't come in due to cost of care. She is going to call back after 7/2 to poss sched an appt. If we do talk to pt on the phone, please ask when she went out of work and if she is ready to go back so we can make sure her fmla papers are done correctly.  Also, pt doesn't have health insurance - she can get [facility] cheaper care in addition to free labs, meds, imaging studies, etc by getting set up with: The orange card program is sponsored by the [facility]. Call [phone] to see if this might be something for which you are eligible. This can ensure you have access to the medical and dental services that [facility] and [facility] help provide to people without health insurance. If she gets the orange card she could be seen at [facility] (many different clinic locations, poss even one in [place] or [place] [facility].

## 2017-04-24 NOTE — Telephone Encounter (Signed)
IC LMOVM for dtr to call for appt with Dr. Clelia CroftShaw To evaluate the med-clonazepam and evaluate her BP, Nausea, etc.

## 2017-04-24 NOTE — Telephone Encounter (Signed)
IC pt, LMOVM for pt to return call and make appt for Dr. Clelia CroftShaw to evaluate and discuss to see if Clonazepam is the best med for her at this time.

## 2017-04-24 NOTE — Telephone Encounter (Signed)
Noted  

## 2017-04-24 NOTE — Telephone Encounter (Signed)
I need to know when she went out and when she can go back. Is she ready to try to go back now? Please call pt and ask

## 2017-04-30 NOTE — Telephone Encounter (Signed)
Left message to return call to give additional information.

## 2017-04-30 NOTE — Telephone Encounter (Signed)
Called patient and left a message for her to call me back about what days she is need off for her FMLA. I will document and send it to you once she calls back.

## 2017-05-04 NOTE — Telephone Encounter (Signed)
Talked to patient she stated that she went out on 02/11/17 and can not go to the pulmonologist or come back here for a follow up because of money issues. Stated she would call next week to get an appointment with us once her daughter gets her check in the mail.   She doesn't feel like she can go back to work just yet stated she still isnt feeling right in her head.  She is cleared already for 12 weeks from 02/11/17 so that would be through 06/03/17. The paperwork should still be in your box. Please return to the FMLA/Disability box once it is complete. Thank you

## 2017-05-08 NOTE — Telephone Encounter (Addendum)
Gave them to [place] at 104 this a.m. Do not charge for these please - extenuating circumstances.

## 2017-05-08 NOTE — Telephone Encounter (Signed)
Have you had a chance to update these forms and return them to my box yet?

## 2017-05-09 NOTE — Telephone Encounter (Signed)
Paperwork scanned and faxed on 05/09/17 °

## 2017-05-20 ENCOUNTER — Ambulatory Visit: Payer: Self-pay | Admitting: Family Medicine

## 2017-08-26 ENCOUNTER — Telehealth: Payer: Self-pay

## 2017-08-26 NOTE — Telephone Encounter (Signed)
Tried to call patient to review overdue health maintenance, but she hung up on me.

## 2017-09-15 ENCOUNTER — Other Ambulatory Visit: Payer: Self-pay

## 2017-09-15 ENCOUNTER — Encounter (HOSPITAL_COMMUNITY): Payer: Self-pay | Admitting: Family Medicine

## 2017-09-15 ENCOUNTER — Ambulatory Visit (HOSPITAL_COMMUNITY)
Admission: EM | Admit: 2017-09-15 | Discharge: 2017-09-15 | Disposition: A | Discharge status: Home / Self Care | Payer: Self-pay | Attending: Family Medicine | Admitting: Family Medicine

## 2017-09-15 DIAGNOSIS — F419 Anxiety disorder, unspecified: Secondary | ICD-10-CM

## 2017-09-15 DIAGNOSIS — J449 Chronic obstructive pulmonary disease, unspecified: Secondary | ICD-10-CM

## 2017-09-15 MED ORDER — ALPRAZOLAM 0.25 MG PO TABS: 0.2500 mg | ORAL_TABLET | Freq: Two times a day (BID) | ORAL | 0 refills | Status: DC | PRN

## 2017-09-15 NOTE — ED Triage Notes (Signed)
Seen  by provider only

## 2017-09-15 NOTE — ED Provider Notes (Signed)
[facility]   [id number] 09/15/17 Arrival Time: 1429   SUBJECTIVE:  Courtney Blackburn is a 61 y.o. female who presents to the urgent care with complaint of depression and change in respiratory status.  Patient has a h/o COPD.  She also has chronic anxiety, treated successfully in the past with Xanax.  Patient has not responded to Buspar.  Currently on Cymbalta 60 mg.   Would like psych referral     Past Medical History:  Diagnosis Date  . Acid reflux   . Allergy   . Anxiety   . Depression   . Hiatal hernia   . Hypertension    Family History  Problem Relation Age of Onset  . Heart disease Mother   . Heart disease Father   . Cancer Sister   . Cancer Brother    Social History   Socioeconomic History  . Marital status: Divorced    Spouse name: Not on file  . Number of children: Not on file  . Years of education: Not on file  . Highest education level: Not on file  Social Needs  . Financial resource strain: Not on file  . Food insecurity - worry: Not on file  . Food insecurity - inability: Not on file  . Transportation needs - medical: Not on file  . Transportation needs - non-medical: Not on file  Occupational History  . Not on file  Tobacco Use  . Smoking status: Current Every Day Smoker    Packs/day: 1.00    Years: 43.00    Pack years: 43.00    Types: Cigarettes  . Smokeless tobacco: Never Used  Substance and Sexual Activity  . Alcohol use: No  . Drug use: No  . Sexual activity: Not Currently    Birth control/protection: Surgical  Other Topics Concern  . Not on file  Social History Narrative  . Not on file   No outpatient medications have been marked as taking for the 09/15/17 encounter [facility] Encounter).   Allergies  Allergen Reactions  . Codeine Other (See Comments)    headaches      ROS: As per HPI, remainder of ROS negative.   OBJECTIVE:   Vitals:   09/15/17 1450  BP: (!) 146/78  Pulse: 83  Resp: 16  Temp: 98.3  F (36.8 C)  TempSrc: Oral  SpO2: 99%     General appearance: alert; no distress Eyes: PERRL; EOMI; conjunctiva normal HENT: normocephalic; atraumatic;external ears normal without trauma; nasal mucosa normal; oral mucosa normal Neck: supple Lungs: clear to auscultation bilaterally Heart: regular rate and rhythm Back: no CVA tenderness Extremities: no cyanosis or edema; symmetrical with no gross deformities Skin: warm and dry Neurologic: normal gait; grossly normal Psychological: alert and cooperative; normal mood and affect      Labs:  Results for orders placed or performed during the hospital encounter of 03/30/17  Culture, blood (routine x 2) Call MD if unable to obtain prior to antibiotics being given  Result Value Ref Range   Specimen Description BLOOD RIGHT HAND    Special Requests IN PEDIATRIC BOTTLE Blood Culture adequate volume    Culture NO GROWTH 5 DAYS    Report Status 04/05/2017 FINAL   Culture, blood (routine x 2) Call MD if unable to obtain prior to antibiotics being given  Result Value Ref Range   Specimen Description BLOOD RIGHT ANTECUBITAL    Special Requests      BOTTLES DRAWN AEROBIC ONLY Blood Culture adequate volume   Culture  NO GROWTH 5 DAYS    Report Status 04/05/2017 FINAL   MRSA PCR Screening  Result Value Ref Range   MRSA by PCR NEGATIVE NEGATIVE  Comprehensive metabolic panel  Result Value Ref Range   Sodium 136 135 - 145 mmol/L   Potassium 3.4 (L) 3.5 - 5.1 mmol/L   Chloride 96 (L) 101 - 111 mmol/L   CO2 30 22 - 32 mmol/L   Glucose, Bld 97 65 - 99 mg/dL   BUN 7 6 - 20 mg/dL   Creatinine, Ser 1.61 (H) 0.44 - 1.00 mg/dL   Calcium 9.1 8.9 - 09.6 mg/dL   Total Protein 7.6 6.5 - 8.1 g/dL   Albumin 3.8 3.5 - 5.0 g/dL   AST 23 15 - 41 U/L   ALT 16 14 - 54 U/L   Alkaline Phosphatase 85 38 - 126 U/L   Total Bilirubin 0.6 0.3 - 1.2 mg/dL   GFR calc non Af Amer 53 (L) >60 mL/min   GFR calc Af Amer >60 >60 mL/min   Anion gap 10 5 - 15  CBC  with Differential  Result Value Ref Range   WBC 6.5 4.0 - 10.5 K/uL   RBC 4.25 3.87 - 5.11 MIL/uL   Hemoglobin 12.9 12.0 - 15.0 g/dL   HCT 04.5 40.9 - 81.1 %   MCV 97.9 78.0 - 100.0 fL   MCH 30.4 26.0 - 34.0 pg   MCHC 31.0 30.0 - 36.0 g/dL   RDW 91.4 78.2 - 95.6 %   Platelets 215 150 - 400 K/uL   Neutrophils Relative % 53 %   Neutro Abs 3.5 1.7 - 7.7 K/uL   Lymphocytes Relative 36 %   Lymphs Abs 2.3 0.7 - 4.0 K/uL   Monocytes Relative 7 %   Monocytes Absolute 0.5 0.1 - 1.0 K/uL   Eosinophils Relative 3 %   Eosinophils Absolute 0.2 0.0 - 0.7 K/uL   Basophils Relative 1 %   Basophils Absolute 0.0 0.0 - 0.1 K/uL  Urinalysis, Routine w reflex microscopic  Result Value Ref Range   Color, Urine YELLOW YELLOW   APPearance CLEAR CLEAR   Specific Gravity, Urine 1.008 1.005 - 1.030   pH 5.0 5.0 - 8.0   Glucose, UA NEGATIVE NEGATIVE mg/dL   Hgb urine dipstick NEGATIVE NEGATIVE   Bilirubin Urine NEGATIVE NEGATIVE   Ketones, ur NEGATIVE NEGATIVE mg/dL   Protein, ur NEGATIVE NEGATIVE mg/dL   Nitrite NEGATIVE NEGATIVE   Leukocytes, UA NEGATIVE NEGATIVE  Urine rapid drug screen (hosp performed)  Result Value Ref Range   Opiates NONE DETECTED NONE DETECTED   Cocaine NONE DETECTED NONE DETECTED   Benzodiazepines POSITIVE (A) NONE DETECTED   Amphetamines NONE DETECTED NONE DETECTED   Tetrahydrocannabinol NONE DETECTED NONE DETECTED   Barbiturates NONE DETECTED NONE DETECTED  Acetaminophen level  Result Value Ref Range   Acetaminophen (Tylenol), Serum <10 (L) 10 - 30 ug/mL  Salicylate level  Result Value Ref Range   Salicylate Lvl <7.0 2.8 - 30.0 mg/dL  HIV antibody (Routine Testing)  Result Value Ref Range   HIV Screen 4th Generation wRfx Non Reactive Non Reactive  Strep pneumoniae urinary antigen  Result Value Ref Range   Strep Pneumo Urinary Antigen NEGATIVE NEGATIVE  CBC  Result Value Ref Range   WBC 7.8 4.0 - 10.5 K/uL   RBC 4.38 3.87 - 5.11 MIL/uL   Hemoglobin 13.4 12.0 -  15.0 g/dL   HCT 21.3 08.6 - 57.8 %   MCV 97.9 78.0 -  100.0 fL   MCH 30.6 26.0 - 34.0 pg   MCHC 31.2 30.0 - 36.0 g/dL   RDW 16.113.7 09.611.5 - 04.515.5 %   Platelets 193 150 - 400 K/uL  Creatinine, serum  Result Value Ref Range   Creatinine, Ser 1.02 (H) 0.44 - 1.00 mg/dL   GFR calc non Af Amer 59 (L) >60 mL/min   GFR calc Af Amer >60 >60 mL/min  Phosphorus  Result Value Ref Range   Phosphorus <1.0 (LL) 2.5 - 4.6 mg/dL  Magnesium  Result Value Ref Range   Magnesium 1.6 (L) 1.7 - 2.4 mg/dL  Protime-INR  Result Value Ref Range   Prothrombin Time 13.8 11.4 - 15.2 seconds   INR 1.06   Triglycerides  Result Value Ref Range   Triglycerides 165 (H) <150 mg/dL  Phosphorus  Result Value Ref Range   Phosphorus 1.4 (L) 2.5 - 4.6 mg/dL  Magnesium  Result Value Ref Range   Magnesium 2.4 1.7 - 2.4 mg/dL  Basic metabolic panel  Result Value Ref Range   Sodium 137 135 - 145 mmol/L   Potassium 3.1 (L) 3.5 - 5.1 mmol/L   Chloride 100 (L) 101 - 111 mmol/L   CO2 22 22 - 32 mmol/L   Glucose, Bld 187 (H) 65 - 99 mg/dL   BUN 9 6 - 20 mg/dL   Creatinine, Ser 4.091.00 0.44 - 1.00 mg/dL   Calcium 8.9 8.9 - 81.110.3 mg/dL   GFR calc non Af Amer >60 >60 mL/min   GFR calc Af Amer >60 >60 mL/min   Anion gap 15 5 - 15  Basic metabolic panel  Result Value Ref Range   Sodium 133 (L) 135 - 145 mmol/L   Potassium 3.7 3.5 - 5.1 mmol/L   Chloride 98 (L) 101 - 111 mmol/L   CO2 23 22 - 32 mmol/L   Glucose, Bld 231 (H) 65 - 99 mg/dL   BUN 10 6 - 20 mg/dL   Creatinine, Ser 9.140.97 0.44 - 1.00 mg/dL   Calcium 8.5 (L) 8.9 - 10.3 mg/dL   GFR calc non Af Amer >60 >60 mL/min   GFR calc Af Amer >60 >60 mL/min   Anion gap 12 5 - 15  CBC  Result Value Ref Range   WBC 13.9 (H) 4.0 - 10.5 K/uL   RBC 3.97 3.87 - 5.11 MIL/uL   Hemoglobin 11.9 (L) 12.0 - 15.0 g/dL   HCT 78.237.1 95.636.0 - 21.346.0 %   MCV 93.5 78.0 - 100.0 fL   MCH 30.0 26.0 - 34.0 pg   MCHC 32.1 30.0 - 36.0 g/dL   RDW 08.614.0 57.811.5 - 46.915.5 %   Platelets 201 150 - 400 K/uL    Magnesium  Result Value Ref Range   Magnesium 2.0 1.7 - 2.4 mg/dL  Phosphorus  Result Value Ref Range   Phosphorus 3.7 2.5 - 4.6 mg/dL  Glucose, capillary  Result Value Ref Range   Glucose-Capillary 112 (H) 65 - 99 mg/dL   Comment 1 Capillary Specimen   Glucose, capillary  Result Value Ref Range   Glucose-Capillary 112 (H) 65 - 99 mg/dL   Comment 1 Capillary Specimen    Comment 2 Notify RN   Basic metabolic panel  Result Value Ref Range   Sodium 139 135 - 145 mmol/L   Potassium 4.8 3.5 - 5.1 mmol/L   Chloride 101 101 - 111 mmol/L   CO2 28 22 - 32 mmol/L   Glucose, Bld 97 65 - 99 mg/dL  BUN 13 6 - 20 mg/dL   Creatinine, Ser 8.110.89 0.44 - 1.00 mg/dL   Calcium 8.8 (L) 8.9 - 10.3 mg/dL   GFR calc non Af Amer >60 >60 mL/min   GFR calc Af Amer >60 >60 mL/min   Anion gap 10 5 - 15  CBC  Result Value Ref Range   WBC 15.7 (H) 4.0 - 10.5 K/uL   RBC 3.81 (L) 3.87 - 5.11 MIL/uL   Hemoglobin 11.6 (L) 12.0 - 15.0 g/dL   HCT 91.437.4 78.236.0 - 95.646.0 %   MCV 98.2 78.0 - 100.0 fL   MCH 30.4 26.0 - 34.0 pg   MCHC 31.0 30.0 - 36.0 g/dL   RDW 21.315.1 08.611.5 - 57.815.5 %   Platelets 216 150 - 400 K/uL  Magnesium  Result Value Ref Range   Magnesium 2.4 1.7 - 2.4 mg/dL  Phosphorus  Result Value Ref Range   Phosphorus 3.5 2.5 - 4.6 mg/dL  Glucose, capillary  Result Value Ref Range   Glucose-Capillary 132 (H) 65 - 99 mg/dL   Comment 1 Capillary Specimen    Comment 2 Notify RN   Glucose, capillary  Result Value Ref Range   Glucose-Capillary 123 (H) 65 - 99 mg/dL  Glucose, capillary  Result Value Ref Range   Glucose-Capillary 122 (H) 65 - 99 mg/dL   Comment 1 Capillary Specimen   Glucose, capillary  Result Value Ref Range   Glucose-Capillary 102 (H) 65 - 99 mg/dL   Comment 1 Capillary Specimen   Glucose, capillary  Result Value Ref Range   Glucose-Capillary 135 (H) 65 - 99 mg/dL   Comment 1 Notify RN   Glucose, capillary  Result Value Ref Range   Glucose-Capillary 130 (H) 65 - 99 mg/dL   Basic metabolic panel  Result Value Ref Range   Sodium 139 135 - 145 mmol/L   Potassium 3.6 3.5 - 5.1 mmol/L   Chloride 102 101 - 111 mmol/L   CO2 28 22 - 32 mmol/L   Glucose, Bld 84 65 - 99 mg/dL   BUN 12 6 - 20 mg/dL   Creatinine, Ser 4.690.89 0.44 - 1.00 mg/dL   Calcium 8.6 (L) 8.9 - 10.3 mg/dL   GFR calc non Af Amer >60 >60 mL/min   GFR calc Af Amer >60 >60 mL/min   Anion gap 9 5 - 15  CBC  Result Value Ref Range   WBC 16.4 (H) 4.0 - 10.5 K/uL   RBC 3.61 (L) 3.87 - 5.11 MIL/uL   Hemoglobin 11.0 (L) 12.0 - 15.0 g/dL   HCT 62.935.5 (L) 52.836.0 - 41.346.0 %   MCV 98.3 78.0 - 100.0 fL   MCH 30.5 26.0 - 34.0 pg   MCHC 31.0 30.0 - 36.0 g/dL   RDW 24.414.6 01.011.5 - 27.215.5 %   Platelets 204 150 - 400 K/uL  Glucose, capillary  Result Value Ref Range   Glucose-Capillary 225 (H) 65 - 99 mg/dL  Glucose, capillary  Result Value Ref Range   Glucose-Capillary 92 65 - 99 mg/dL   Comment 1 Notify RN    Comment 2 Document in Chart   Glucose, capillary  Result Value Ref Range   Glucose-Capillary 77 65 - 99 mg/dL  Hemoglobin [id number]  Result Value Ref Range   Hgb A1c MFr Bld 6.0 (H) 4.8 - 5.6 %   Mean Plasma Glucose 126 mg/dL  Glucose, capillary  Result Value Ref Range   Glucose-Capillary 110 (H) 65 - 99 mg/dL  I-Stat CG4 Lactic Acid,  ED  Result Value Ref Range   Lactic Acid, Venous 0.95 0.5 - 1.9 mmol/L  CBG monitoring, ED  Result Value Ref Range   Glucose-Capillary 99 65 - 99 mg/dL   Comment 1 Notify RN   I-Stat CG4 Lactic Acid, ED  Result Value Ref Range   Lactic Acid, Venous <0.30 (L) 0.5 - 1.9 mmol/L  I-Stat arterial blood gas, ED  Result Value Ref Range   pH, Arterial 7.260 (L) 7.350 - 7.450   pCO2 arterial 76.5 (HH) 32.0 - 48.0 mmHg   pO2, Arterial 93.0 83.0 - 108.0 mmHg   Bicarbonate 34.6 (H) 20.0 - 28.0 mmol/L   TCO2 37 0 - 100 mmol/L   O2 Saturation 96.0 %   Acid-Base Excess 5.0 (H) 0.0 - 2.0 mmol/L   Patient temperature 97.5 F    Sample type ARTERIAL    Comment NOTIFIED PHYSICIAN    I-Stat Arterial Blood Gas, ED - (order at [facility] and [facility] only)  Result Value Ref Range   pH, Arterial 7.257 (L) 7.350 - 7.450   pCO2 arterial 76.5 (HH) 32.0 - 48.0 mmHg   pO2, Arterial 82.0 (L) 83.0 - 108.0 mmHg   Bicarbonate 34.3 (H) 20.0 - 28.0 mmol/L   TCO2 37 0 - 100 mmol/L   O2 Saturation 94.0 %   Acid-Base Excess 5.0 (H) 0.0 - 2.0 mmol/L   Patient temperature 97.5 F    Sample type ARTERIAL   I-Stat Arterial Blood Gas, ED - (order at [facility] and [facility] only)  Result Value Ref Range   pH, Arterial 7.261 (L) 7.350 - 7.450   pCO2 arterial 75.1 (HH) 32.0 - 48.0 mmHg   pO2, Arterial 69.0 (L) 83.0 - 108.0 mmHg   Bicarbonate 33.8 (H) 20.0 - 28.0 mmol/L   TCO2 36 0 - 100 mmol/L   O2 Saturation 89.0 %   Acid-Base Excess 4.0 (H) 0.0 - 2.0 mmol/L   Patient temperature 98.69 F    Collection site BRACHIAL ARTERY    Drawn by RT    Sample type ARTERIAL    Comment NOTIFIED PHYSICIAN   I-Stat arterial blood gas, ED  Result Value Ref Range   pH, Arterial 7.477 (H) 7.350 - 7.450   pCO2 arterial 40.7 32.0 - 48.0 mmHg   pO2, Arterial 138.0 (H) 83.0 - 108.0 mmHg   Bicarbonate 30.2 (H) 20.0 - 28.0 mmol/L   TCO2 31 0 - 100 mmol/L   O2 Saturation 99.0 %   Acid-Base Excess 6.0 (H) 0.0 - 2.0 mmol/L   Patient temperature 97.5 F    Sample type ARTERIAL     Labs Reviewed - No data to display  No results found.     ASSESSMENT & PLAN:  1. Anxiety disorder, unspecified type   2. COPD not affecting current episode of care [facility])     Meds ordered this encounter  Medications  . ALPRAZolam (XANAX) 0.25 MG tablet    Sig: Take 1 tablet (0.25 mg total) 2 (two) times daily as needed by mouth for anxiety.    Dispense:  30 tablet    Refill:  0    Reviewed expectations re: course of current medical issues. Questions answered. Outlined signs and symptoms indicating need for more acute intervention. Patient verbalized understanding. After Visit Summary given.    Procedures:      Elvina Sidle,  MD 09/15/17 [phone]

## 2017-09-15 NOTE — Discharge Instructions (Signed)
Call Dr. Donell BeersPlovsky for evaluation tomorrow.

## 2017-09-17 ENCOUNTER — Telehealth: Payer: Self-pay | Admitting: Family Medicine

## 2017-09-17 NOTE — Telephone Encounter (Signed)
Copied from CRM [phone]. Topic: Quick Communication - See Telephone Encounter >> Sep 17, 2017  2:55 PM Everardo PacificMoton, Oluwadamilola Deliz, [place] wrote: CRM for notification. See Telephone encounter for: Patient stated that she is on Cymbalta but it isn't helping with her Anxiety and Depression and would like to know if there is a different medication that she could have. If someone could give you a call back at [phone]  09/17/17.

## 2017-09-17 NOTE — Telephone Encounter (Signed)
Please advise 

## 2017-09-17 NOTE — Telephone Encounter (Signed)
If pt was taking the prescribed levothyroxine, she would have ran out several months ago. She needs to follow-up in an office visit with labs before refill.  [facility] and prescribed alprazolam for her xanax - a 15d supply - by Dr. Milus GlazierLauenstein and referred to Dr. Donell BeersPlovsky - psychiatrist - she needs to call the phone number she was given on her AVS to schedule an appt ASAP - they will NOT call her to schedule and they might be booking a long ways out.  She needs to f/u with psych for her anxiety treatment.

## 2017-09-17 NOTE — Telephone Encounter (Signed)
Copied from CRM [phone]. Topic: Quick Communication - See Telephone Encounter >> Sep 17, 2017  2:43 PM Everardo PacificMoton, Iziah Cates, [place] wrote: CRM for notification. See Telephone encounter for: Patient called and stated that she has never received her Levothyroxine nor does she have the script for the medication. Patient stated that she needs the medication for her thyroid and she hasn't been on it and she is feeling bad and doesn't know if its because of her not taking the Levothyroxine. If someone could please give her a call back at [phone]  09/17/17.

## 2017-09-18 NOTE — Telephone Encounter (Signed)
Informed pt of Dr. Clelia CroftShaw advise and informed her to give us a call back her at the office if she has anymore concerns and to make her appointment.

## 2017-09-25 NOTE — Telephone Encounter (Signed)
Pt called in to be advised. Pt says that she don't have the money to come in to be seen / speak with provider about concern. Pt says that she was once taken out of work and is now ready to return. Pt says that she was originally suppose to return on 06/03/17 but since then has had a few hospital visits that has prevented her from returning. She would like for Dr. Clelia CroftShaw to provide her with a return to work note.     CB: [phone]

## 2017-09-25 NOTE — Telephone Encounter (Signed)
See phone message re: out of work note

## 2017-09-26 ENCOUNTER — Telehealth: Payer: Self-pay | Admitting: Family Medicine

## 2017-09-26 NOTE — Telephone Encounter (Signed)
LMOVM with Dr. Alver FisherShaw's message - read out phone number for [facility]  Twice so pt could be sure to get it.

## 2017-09-26 NOTE — Telephone Encounter (Signed)
Unable to provider note. I have not seen pt in 5 months - she has had 3 ER visits since then.  I cannot certify to her employer that I believe that she is safe to do her job without any clinical evaluation.  And I don't see any out of work notes in pts chart in the past year so doubt that she really needs a release to return. . .    If pt is having trouble affording coming in for appointments, she should look in to the below resources which can help pt's access care on a sliding scale fee:  The orange card program is sponsored by the [facility]. Call [phone] to see if this might be something for which you are eligible. This can ensure you have access to the medical and dental services that [facility] help provide to people without health insurance. Please call the clinic below to get an appointment asap.  They are a clinic set up by [facility] to care for people without health insurance so may be able to get you care at much cheaper cost with a large discount on labs, imaging, and medication. [facility] [address] [place], [place] [id number] Hours of Operation Mon - Fri: 9 a.m. - 6 p.m. Main: [phone]

## 2017-09-26 NOTE — Telephone Encounter (Signed)
Copied from CRM [id number]. Topic: Quick Communication - See Telephone Encounter >> Sep 26, 2017  4:47 PM Everardo PacificMoton, Aqsa Sensabaugh, [place] wrote: CRM for notification. See Telephone encounter for: When patient was last seen in the office she stated that the doctor sent her straight to the ER. There she was in ICU for 3-4 days and a regular room for 1. She hasn't been back to the office but says that Dr.Shaw had her return back to work papers and she never has received them back. She is still out of work because she needs the return back to work papers filled out and signed. Patient also stated that she doesn't have the money to come into the office to be seen. If someone could please give her a call back about this matter.  09/26/17.

## 2017-09-27 NOTE — Telephone Encounter (Signed)
Please advise 

## 2017-09-28 NOTE — Telephone Encounter (Signed)
See canned in disability forms on 05/09/17 - perhaps pt is referring to this. Ok to re-send these in and/or mail pt a copy.

## 2017-10-18 ENCOUNTER — Encounter (HOSPITAL_COMMUNITY): Payer: Self-pay | Admitting: Emergency Medicine

## 2017-10-18 ENCOUNTER — Ambulatory Visit (HOSPITAL_COMMUNITY)
Admission: EM | Admit: 2017-10-18 | Discharge: 2017-10-18 | Disposition: A | Discharge status: Home / Self Care | Payer: Self-pay | Attending: Emergency Medicine | Admitting: Emergency Medicine

## 2017-10-18 DIAGNOSIS — J0100 Acute maxillary sinusitis, unspecified: Secondary | ICD-10-CM

## 2017-10-18 DIAGNOSIS — J069 Acute upper respiratory infection, unspecified: Secondary | ICD-10-CM

## 2017-10-18 DIAGNOSIS — F411 Generalized anxiety disorder: Secondary | ICD-10-CM

## 2017-10-18 MED ORDER — BUSPIRONE HCL 15 MG PO TABS: ORAL_TABLET | ORAL | 0 refills | Status: DC

## 2017-10-18 MED ORDER — AZITHROMYCIN 250 MG PO TABS: ORAL_TABLET | ORAL | 0 refills | Status: DC

## 2017-10-18 NOTE — ED Provider Notes (Signed)
[facility]    CSN: [id number] Arrival date & time: 10/18/17  1042     History   Chief Complaint Chief Complaint  Patient presents with  . URI  . Medication Refill    HPI Courtney Blackburn is a 61 y.o. female.   61 -year-old female complaining of sinus pressure and right-sided of her face associated with teeth pain. Complains of PND, cough for 3 weeks. Denies fever or shortness of breath. She does have COPD and currently smokes. She states in various OTC medications including Mucinex, NyQuil and others. She currently wishes to have her refill her Xanax. She had been receiving this from another physician at this urgent care. Complaining of severe anxiety and occasional panic attacks.      Past Medical History:  Diagnosis Date  . Acid reflux   . Allergy   . Anxiety   . Depression   . Hiatal hernia   . Hypertension     Patient Active Problem List   Diagnosis Date Noted  . Encephalopathy acute   . Community acquired pneumonia of left lower lobe of lung (HCC) 03/31/2017  . Acute hypercapnic respiratory failure (HCC) 03/31/2017  . COPD (chronic obstructive pulmonary disease) (HCC) 03/31/2017  . Tobacco use disorder 03/15/2017  . Chronic bronchitis (HCC) 03/15/2017  . Essential hypertension 02/14/2016  . Depression 02/14/2016    Past Surgical History:  Procedure Laterality Date  . APPENDECTOMY    . TUBAL LIGATION    . WISDOM TOOTH EXTRACTION      OB History    Gravida Para Term Preterm AB Living   1         1   SAB TAB Ectopic Multiple Live Births                   Home Medications    Prior to Admission medications   Medication Sig Start Date End Date Taking? Authorizing Provider  ALPRAZolam (XANAX) 0.25 MG tablet Take 1 tablet (0.25 mg total) 2 (two) times daily as needed by mouth for anxiety. 09/15/17  Yes Elvina SidleLauenstein, Kurt, MD  Aspirin-Salicylamide-Caffeine (BC HEADACHE POWDER PO) Take 1 packet by mouth 4 (four) times daily as needed  (pain/headache).   Yes [provider]  losartan-hydrochlorothiazide (HYZAAR) 100-12.5 MG tablet Take 0.5 tablets by mouth daily. 04/03/17  Yes Rai, Ripudeep K, MD  mometasone-formoterol (DULERA) 100-5 MCG/ACT AERO Inhale 2 puffs into the lungs 2 (two) times daily. 04/03/17  Yes Rai, Ripudeep K, MD  Multiple Vitamin (MULTIVITAMIN WITH MINERALS) TABS tablet Take 1 tablet by mouth daily.   Yes [provider]  omeprazole (PRILOSEC OTC) 20 MG tablet Take 20 mg by mouth daily.   Yes [provider]  pantoprazole (PROTONIX) 40 MG tablet Take 1 tablet (40 mg total) by mouth daily. 04/03/17  Yes Rai, Ripudeep K, MD  tiotropium (SPIRIVA) 18 MCG inhalation capsule Place 1 capsule (18 mcg total) into inhaler and inhale daily. 04/03/17  Yes Rai, Ripudeep K, MD  albuterol (PROVENTIL HFA;VENTOLIN HFA) 108 (90 Base) MCG/ACT inhaler Inhale 2 puffs into the lungs every 4 (four) hours as needed for wheezing or shortness of breath (cough, shortness of breath or wheezing.). 04/03/17   Rai, Delene Ruffiniipudeep K, MD  azithromycin (ZITHROMAX) 250 MG tablet 2 tabs po on day one, then one tablet po once daily on days 2-5. 10/18/17   Hayden RasmussenMabe, Keller Bounds, NP  busPIRone (BUSPAR) 15 MG tablet Take 1 tablet every 8 to 12 hrs prn anxiety 10/18/17  Hayden RasmussenMabe, Jazlynn Nemetz, NP  DULoxetine (CYMBALTA) 60 MG capsule Take 1 capsule (60 mg total) by mouth daily. 03/15/17   Sherren MochaShaw, Eva N, MD  levothyroxine (SYNTHROID, LEVOTHROID) 25 MCG tablet Take 1 tablet (25 mcg total) by mouth daily before breakfast. 04/04/17   Rai, Delene Ruffiniipudeep K, MD  OVER THE COUNTER MEDICATION Apply 1 application topically 3 (three) times daily as needed (rash under breasts). OTC antifungal cream    [provider]    Family History Family History  Problem Relation Age of Onset  . Heart disease Mother   . Heart disease Father   . Cancer Sister   . Cancer Brother     Social History Social History   Tobacco Use  . Smoking status: Current Every Day Smoker     Packs/day: 1.00    Years: 43.00    Pack years: 43.00    Types: Cigarettes  . Smokeless tobacco: Never Used  Substance Use Topics  . Alcohol use: No  . Drug use: No     Allergies   Codeine   Review of Systems Review of Systems  Constitutional: Negative.  Negative for activity change, appetite change, chills, fatigue and fever.  HENT: Positive for congestion, postnasal drip and rhinorrhea. Negative for facial swelling.   Eyes: Negative.   Respiratory: Positive for cough.   Cardiovascular: Negative.   Musculoskeletal: Negative for neck pain and neck stiffness.  Skin: Negative for pallor and rash.  Neurological: Negative.   All other systems reviewed and are negative.    Physical Exam Triage Vital Signs ED Triage Vitals  Enc Vitals Group     BP 10/18/17 1118 (!) 120/58     Pulse Rate 10/18/17 1118 70     Resp 10/18/17 1118 20     Temp 10/18/17 1118 98.4 F (36.9 C)     Temp Source 10/18/17 1118 Oral     SpO2 10/18/17 1118 99 %     Weight --      Height --      Head Circumference --      Peak Flow --      Pain Score 10/18/17 1120 4     Pain Loc --      Pain Edu? --      Excl. in GC? --    No data found.  Updated Vital Signs BP (!) 120/58 (BP Location: Left Arm)   Pulse 70   Temp 98.4 F (36.9 C) (Oral)   Resp 20   LMP 10/02/2012   SpO2 99%   Visual Acuity Right Eye Distance:   Left Eye Distance:   Bilateral Distance:    Right Eye Near:   Left Eye Near:    Bilateral Near:     Physical Exam  Constitutional: She is oriented to person, place, and time. She appears well-developed and well-nourished. No distress.  HENT:  Mouth/Throat: Oropharynx is clear and moist.  Neck: Normal range of motion. Neck supple.  Cardiovascular: Normal rate and regular rhythm.  Pulmonary/Chest: Effort normal. No respiratory distress.  Mild expiratory coarseness bilaterally. Otherwise good air movement.  Musculoskeletal: Normal range of motion. She exhibits no edema.    Lymphadenopathy:    She has no cervical adenopathy.  Neurological: She is alert and oriented to person, place, and time.  Skin: Skin is warm and dry. No rash noted.  Psychiatric: Her mood appears anxious. She exhibits a depressed mood.  Nursing note and vitals reviewed.    UC Treatments / Results  Labs (all labs  ordered are listed, but only abnormal results are displayed) Labs Reviewed - No data to display  EKG  EKG Interpretation None       Radiology No results found.  Procedures Procedures (including critical care time)  Medications Ordered in UC Medications - No data to display   Initial Impression / Assessment and Plan / UC Course  I have reviewed the triage vital signs and the nursing notes.  Pertinent labs & imaging results that were available during my care of the patient were reviewed by me and considered in my medical decision making (see chart for details).    Sudafed PE 10 mg every 4 to 6 hours as needed for congestion Allegra or Zyrtec daily as needed for drainage and runny nose. For stronger antihistamine may take Chlor-Trimeton 2 to 4 mg every 4 to 6 hours, may cause drowsiness.S Saline nasal spray used frequently. Drink plenty of fluids and stay well-hydrated. Other medication as directed, Can cause sedation.     Final Clinical Impressions(s) / UC Diagnoses   Final diagnoses:  Viral upper respiratory tract infection  Acute non-recurrent maxillary sinusitis  Generalized anxiety disorder    ED Discharge Orders        Ordered    busPIRone (BUSPAR) 15 MG tablet     10/18/17 1216    azithromycin (ZITHROMAX) 250 MG tablet     10/18/17 1216       Controlled Substance Prescriptions [place] Controlled Substance Registry consulted? Not Applicable   Hayden Rasmussen, NP 10/18/17 2024

## 2017-10-18 NOTE — Discharge Instructions (Signed)
Sudafed PE 10 mg every 4 to 6 hours as needed for congestion Allegra or Zyrtec daily as needed for drainage and runny nose. For stronger antihistamine may take Chlor-Trimeton 2 to 4 mg every 4 to 6 hours, may cause drowsiness.S Saline nasal spray used frequently. Drink plenty of fluids and stay well-hydrated. Other medication as directed, Can cause sedation.

## 2017-10-18 NOTE — ED Triage Notes (Signed)
PT C/O: cold sx associated w/facial pressure, prod cough.... Hx of COPD  Also needing refill on meds  Smokes 1 PPD  ONSET: 3 weeks  DENIES: fevers  TAKING MEDS: Nyquil/Dayquil OTC cold meds  A&O x4... NAD... Ambulatory

## 2017-11-18 NOTE — Telephone Encounter (Signed)
Tried calling pt to verify that this has been done,  but phone is disconnected

## 2017-11-18 NOTE — Telephone Encounter (Signed)
Mailed copy to pts home.

## 2017-11-23 ENCOUNTER — Other Ambulatory Visit: Payer: Self-pay

## 2017-11-23 ENCOUNTER — Ambulatory Visit (HOSPITAL_COMMUNITY)
Admission: EM | Admit: 2017-11-23 | Discharge: 2017-11-23 | Disposition: A | Discharge status: Home / Self Care | Payer: Self-pay | Attending: Family Medicine | Admitting: Family Medicine

## 2017-11-23 ENCOUNTER — Encounter (HOSPITAL_COMMUNITY): Payer: Self-pay | Admitting: *Deleted

## 2017-11-23 DIAGNOSIS — Z720 Tobacco use: Secondary | ICD-10-CM

## 2017-11-23 DIAGNOSIS — J449 Chronic obstructive pulmonary disease, unspecified: Secondary | ICD-10-CM

## 2017-11-23 NOTE — Discharge Instructions (Addendum)
Stop smoking

## 2017-11-23 NOTE — ED Triage Notes (Addendum)
nasal congestion x2 wks, cough, per pt her anxiety is high, per pt she can not afford her meds so that's why she have not had any of her meds,

## 2017-11-23 NOTE — ED Provider Notes (Addendum)
[facility]    CSN: [id number] Arrival date & time: 11/23/17  1226     History   Chief Complaint Chief Complaint  Patient presents with  . Facial Pain    HPI Courtney Blackburn is a 63 y.o. female.   HPI  The patient presents for a social visit today.  She has a history of tobacco abuse and COPD.  She was admitted for a COPD exacerbation back in June 2018.  She was intubated and spent time in the ICU.  Eventually, she was discharged.  During that time, she was on FMLA.  While she was off work, she ran out of funds and cannot afford any of her medicine.  Additionally, she cannot afford to see her PCP or specialist.  She needs a note from a physician to get back to work.  She states that her breathing is fair and she is able to ambulate.  She works at [employer] and [employer].  Sometimes she does need to walk the store, however she feels she is able to do this.  She is still smoking.  Past Medical History:  Diagnosis Date  . Acid reflux   . Allergy   . Anxiety   . Depression   . Hiatal hernia   . Hypertension     Patient Active Problem List   Diagnosis Date Noted  . Encephalopathy acute   . Community acquired pneumonia of left lower lobe of lung (HCC) 03/31/2017  . Acute hypercapnic respiratory failure (HCC) 03/31/2017  . COPD (chronic obstructive pulmonary disease) (HCC) 03/31/2017  . Tobacco use disorder 03/15/2017  . Chronic bronchitis (HCC) 03/15/2017  . Essential hypertension 02/14/2016  . Depression 02/14/2016    Past Surgical History:  Procedure Laterality Date  . APPENDECTOMY    . TUBAL LIGATION    . WISDOM TOOTH EXTRACTION      OB History    Gravida Para Term Preterm AB Living   1         1   SAB TAB Ectopic Multiple Live Births                   Home Medications    Prior to Admission medications   Medication Sig Start Date End Date Taking? Authorizing Provider  levothyroxine (SYNTHROID, LEVOTHROID) 25 MCG tablet Take 1  tablet (25 mcg total) by mouth daily before breakfast. 04/04/17  Yes Rai, Ripudeep K, MD  albuterol (PROVENTIL HFA;VENTOLIN HFA) 108 (90 Base) MCG/ACT inhaler Inhale 2 puffs into the lungs every 4 (four) hours as needed for wheezing or shortness of breath (cough, shortness of breath or wheezing.). 04/03/17   Rai, Delene Ruffini, MD  ALPRAZolam Prudy Feeler) 0.25 MG tablet Take 1 tablet (0.25 mg total) 2 (two) times daily as needed by mouth for anxiety. 09/15/17   Elvina Sidle, MD  Aspirin-Salicylamide-Caffeine (BC HEADACHE POWDER PO) Take 1 packet by mouth 4 (four) times daily as needed (pain/headache).    [provider]  busPIRone (BUSPAR) 15 MG tablet Take 1 tablet every 8 to 12 hrs prn anxiety 10/18/17   Hayden Rasmussen, NP  DULoxetine (CYMBALTA) 60 MG capsule Take 1 capsule (60 mg total) by mouth daily. 03/15/17   Sherren Mocha, MD  losartan-hydrochlorothiazide (HYZAAR) 100-12.5 MG tablet Take 0.5 tablets by mouth daily. 04/03/17   Rai, Ripudeep K, MD  mometasone-formoterol (DULERA) 100-5 MCG/ACT AERO Inhale 2 puffs into the lungs 2 (two) times daily. 04/03/17   Cathren Harsh, MD  Multiple Vitamin (  MULTIVITAMIN WITH MINERALS) TABS tablet Take 1 tablet by mouth daily.    [provider]  omeprazole (PRILOSEC OTC) 20 MG tablet Take 20 mg by mouth daily.    [provider]  OVER THE COUNTER MEDICATION Apply 1 application topically 3 (three) times daily as needed (rash under breasts). OTC antifungal cream    [provider]  pantoprazole (PROTONIX) 40 MG tablet Take 1 tablet (40 mg total) by mouth daily. 04/03/17   Rai, Delene Ruffini, MD  tiotropium (SPIRIVA) 18 MCG inhalation capsule Place 1 capsule (18 mcg total) into inhaler and inhale daily. 04/03/17   Cathren Harsh, MD    Family History Family History  Problem Relation Age of Onset  . Heart disease Mother   . Heart disease Father   . Cancer Sister   . Cancer Brother     Social History Social History   Tobacco Use  .  Smoking status: Current Every Day Smoker    Packs/day: 1.00    Years: 43.00    Pack years: 43.00    Types: Cigarettes  . Smokeless tobacco: Never Used  Substance Use Topics  . Alcohol use: No  . Drug use: No     Allergies   Codeine   Review of Systems Review of Systems  Respiratory: Negative for shortness of breath.   Cardiovascular: Negative for chest pain.     Physical Exam Triage Vital Signs ED Triage Vitals [11/23/17 1403]  Enc Vitals Group     BP (!) 190/83     Pulse Rate 72     Resp      Temp 98.1 F (36.7 C)     Temp Source Oral     SpO2 98 %   Updated Vital Signs BP (!) 190/83 (BP Location: Left Arm)   Pulse 72   Temp 98.1 F (36.7 C) (Oral)   LMP 10/02/2012   SpO2 98%   Physical Exam  Constitutional: She is oriented to person, place, and time. She appears well-developed and well-nourished.  HENT:  Head: Normocephalic and atraumatic.  Cardiovascular: Normal rate and regular rhythm.  Pulmonary/Chest: Effort normal.  Faint expiratory wheezes at the bases  Neurological: She is alert and oriented to person, place, and time.  Skin: Skin is warm and dry.  Psychiatric: She has a normal mood and affect. Judgment normal.     UC Treatments / Results  Procedures Procedures none  Initial Impression / Assessment and Plan / UC Course  I have reviewed the triage vital signs and the nursing notes.  Pertinent labs & imaging results that were available during my care of the patient were reviewed by me and considered in my medical decision making (see chart for details).     62 year old female presents with COPD and tobacco abuse requesting a letter to return to work.  Unless she lied to me, it appears she has recovered from her hospitalization for which she was missing work to begin with.  Her lungs sound surprisingly good for not being on her chronic inhalers.  I reviewed her records from her hospitalization.  In this case, I believe it is okay for her to  return.  It is frustrating that she cannot get in with her PCP or regular specialist to have this taken care of.  She was strongly encouraged to stop smoking.  She should follow-up with her pulmonologist as soon as possible.  She is getting set up with the wellness and [facility] which I think  is a good idea.  Final Clinical Impressions(s) / UC Diagnoses   Final diagnoses:  Chronic obstructive pulmonary disease, unspecified COPD type (HCC)  Tobacco abuse    Controlled Substance Prescriptions [place] Controlled Substance Registry consulted? Not Applicable   Sharlene DoryWendling, Nicholas Paul, DO 11/23/17 1437    Sharlene DoryWendling, Nicholas Paul, DO 11/23/17 1438

## 2017-12-25 ENCOUNTER — Ambulatory Visit (INDEPENDENT_AMBULATORY_CARE_PROVIDER_SITE_OTHER): Payer: Self-pay | Admitting: Pulmonary Disease

## 2017-12-25 ENCOUNTER — Encounter: Payer: Self-pay | Admitting: Pulmonary Disease

## 2017-12-25 VITALS — BP 140/90 | HR 76 | Ht 62.0 in | Wt 151.6 lb

## 2017-12-25 DIAGNOSIS — J449 Chronic obstructive pulmonary disease, unspecified: Secondary | ICD-10-CM

## 2017-12-25 MED ORDER — ALBUTEROL SULFATE (2.5 MG/3ML) 0.083% IN NEBU: 2.5000 mg | INHALATION_SOLUTION | Freq: Four times a day (QID) | RESPIRATORY_TRACT | 12 refills | Status: DC | PRN

## 2017-12-25 MED ORDER — IPRATROPIUM BROMIDE 0.02 % IN SOLN: 0.5000 mg | Freq: Four times a day (QID) | RESPIRATORY_TRACT | 12 refills | Status: DC

## 2017-12-25 NOTE — Patient Instructions (Signed)
Will arrange for home nebulizer Albuterol and Ipratropium one vial nebulized every 4 to 6 hours as needed for cough, wheeze, or chest congestion  Follow up in 8 weeks

## 2017-12-25 NOTE — Progress Notes (Signed)
[facility]  Chief Complaint  Patient presents with  . pulm consult    Pt seen VS in [place]. Pt has productive cough- yellow/brown/green thick mucus, wheezing, SOB at rest and w/exertion. Pt has anxiety with panic attacks daily. Pt has chest tightness, upper back pain, swelling in legs and hands in last 107mo.    Vital signs: BP 140/90 (BP Location: Left Arm, Cuff Size: Normal)   Pulse 76   Ht 5\' 2"  (1.575 m)   Wt 151 lb 9.6 oz (68.8 kg)   LMP 10/02/2012   SpO2 98%   BMI 27.73 kg/m   History of Present Illness: Courtney Blackburn is a 62 y.o. female smoker with COPD.  I saw her in hospital in June 2018.  At that time she had pneumonia, COPD exacerbation, and was on a ventilator for acute on chronic hypoxic and hypercapnic respiratory failure.   She doesn't have insurance.  She hasn't been on any inhalers.  She gets short of breath and fatigued easily.  She gets episodes of coughing and chest congestion.  She gets intermittent episodes of wheezing.  She has nasal congestion with post nasal drip.  She still smokes about 1 pack of cigarettes.  She hasn't tried anything before.  She does have history of anxiety and depression.  Her daughter says her breathing gets worse at night.  Ambulatory oximetry on room air today - maintained SpO2 above 92%.  Physical Exam:  General - pleasant Eyes - pupils reactive ENT - no sinus tenderness, no oral exudate, no LAN Cardiac - regular, no murmur Chest - no wheeze, rales Abd - soft, non tender Ext - no edema Skin - no rashes Neuro - normal strength Psych - normal mood   Assessment/Plan:  COPD with chronic bronchitis. - therapies are limited by her finances  - will set her up for a nebulizer and albuterol/ipratropium  Tobacco abuse. - reviewed different options to assist with smoking cessation - she is concerned about trying chantix or wellbutrin with her history of anxiety and  depression - she will try to enroll with smoking cessation program offered through [employer] where she works   Patient Instructions  Will arrange for home nebulizer Albuterol and Ipratropium one vial nebulized every 4 to 6 hours as needed for cough, wheeze, or chest congestion  Follow up in 8 weeks    Coralyn Helling, MD [facility] 12/25/2017, 5:25 PM Pager:  [phone]  Flow Sheet  Pulmonary tests: Spirometry 12/25/17 >> FEV1 1.19 (50%), FEV1% 67  Sleep tests:  Cardiac tests:  Review of Systems: Constitutional: Positive for activity change, appetite change and unexpected weight change. Negative for fever.  HENT: Positive for congestion, dental problem, ear pain, postnasal drip, sinus pressure, sneezing and trouble swallowing. Negative for nosebleeds, rhinorrhea and sore throat.   Eyes: Positive for photophobia and itching. Negative for redness.  Respiratory: Positive for cough, choking, chest tightness, shortness of breath and wheezing.   Cardiovascular: Positive for leg swelling. Negative for palpitations.  Gastrointestinal: Positive for constipation, diarrhea and nausea. Negative for vomiting.  Genitourinary: Negative for dysuria.  Musculoskeletal: Positive for joint swelling.  Skin: Negative for rash.  Allergic/Immunologic: Positive for environmental allergies. Negative for food allergies and immunocompromised state.  Neurological: Positive for dizziness, light-headedness and headaches.  Hematological: Bruises/bleeds easily.  Psychiatric/Behavioral: Negative for dysphoric mood. The patient is nervous/anxious.    Past Medical History: She  has a past medical history of Acid reflux, Allergy,  Anxiety, Depression, Hiatal hernia, and Hypertension.  Past Surgical History: She  has a past surgical history that includes Tubal ligation; Appendectomy; and Wisdom tooth extraction.  Family History: Her family history includes Cancer in her brother and sister;  Heart disease in her father and mother.  Social History: She  reports that she has been smoking cigarettes.  She has a 43.00 pack-year smoking history. she has never used smokeless tobacco. She reports that she does not drink alcohol or use drugs.  Medications: Allergies as of 12/25/2017      Reactions   Codeine Other (See Comments)   headaches      Medication List        Accurate as of 12/25/17  5:25 PM. Always use your most recent med list.          albuterol (2.5 MG/3ML) 0.083% nebulizer solution Commonly known as:  PROVENTIL Take 3 mLs (2.5 mg total) by nebulization every 6 (six) hours as needed for wheezing or shortness of breath.   ipratropium 0.02 % nebulizer solution Commonly known as:  ATROVENT Take 2.5 mLs (0.5 mg total) by nebulization 4 (four) times daily.   multivitamin with minerals Tabs tablet Take 1 tablet by mouth daily.   omeprazole 20 MG tablet Commonly known as:  PRILOSEC OTC Take 20 mg by mouth daily.

## 2017-12-25 NOTE — Progress Notes (Signed)
   Subjective:    Patient ID: Courtney Blackburn, female    DOB: [birth year], 62 y.o.   MRN: [id number]  HPI    Review of Systems  Constitutional: Positive for activity change, appetite change and unexpected weight change. Negative for fever.  HENT: Positive for congestion, dental problem, ear pain, postnasal drip, sinus pressure, sneezing and trouble swallowing. Negative for nosebleeds, rhinorrhea and sore throat.   Eyes: Positive for photophobia and itching. Negative for redness.  Respiratory: Positive for cough, choking, chest tightness, shortness of breath and wheezing.   Cardiovascular: Positive for leg swelling. Negative for palpitations.  Gastrointestinal: Positive for constipation, diarrhea and nausea. Negative for vomiting.  Genitourinary: Negative for dysuria.  Musculoskeletal: Positive for joint swelling.  Skin: Negative for rash.  Allergic/Immunologic: Positive for environmental allergies. Negative for food allergies and immunocompromised state.  Neurological: Positive for dizziness, light-headedness and headaches.  Hematological: Bruises/bleeds easily.  Psychiatric/Behavioral: Negative for dysphoric mood. The patient is nervous/anxious.        Objective:   Physical Exam        Assessment & Plan:

## 2017-12-31 ENCOUNTER — Telehealth: Payer: Self-pay | Admitting: Pulmonary Disease

## 2017-12-31 DIAGNOSIS — J449 Chronic obstructive pulmonary disease, unspecified: Secondary | ICD-10-CM

## 2017-12-31 NOTE — Telephone Encounter (Signed)
She can try using her nebulizer prior to going to sleep.  Please also arrange for overnight oximetry on room air.

## 2017-12-31 NOTE — Telephone Encounter (Signed)
Pt is calling back [phone]

## 2017-12-31 NOTE — Telephone Encounter (Signed)
Called and spoke with patient, she states that she has woken up the past two days and when she takes a breath in it hurts and it causes her to start coughing. Patient doesn't have any other symptoms. VS please advise on this, thanks.

## 2017-12-31 NOTE — Telephone Encounter (Signed)
ATC pt, no answer. Left message for pt to call back.  

## 2017-12-31 NOTE — Telephone Encounter (Signed)
Called patient, unable to reach left message to give us a call back regarding this.  

## 2018-01-01 NOTE — Telephone Encounter (Signed)
Spoke with pt. She is aware of VS's recommendation. Order has been placed for ONO. Nothing further was needed at this time.

## 2018-01-03 ENCOUNTER — Ambulatory Visit: Payer: Self-pay | Admitting: Family Medicine

## 2018-01-07 ENCOUNTER — Ambulatory Visit (INDEPENDENT_AMBULATORY_CARE_PROVIDER_SITE_OTHER): Payer: Self-pay | Admitting: Pulmonary Disease

## 2018-01-07 ENCOUNTER — Encounter: Payer: Self-pay | Admitting: Pulmonary Disease

## 2018-01-07 ENCOUNTER — Telehealth: Payer: Self-pay | Admitting: Pulmonary Disease

## 2018-01-07 VITALS — BP 136/82 | HR 77 | Ht 62.0 in | Wt 148.0 lb

## 2018-01-07 DIAGNOSIS — J449 Chronic obstructive pulmonary disease, unspecified: Secondary | ICD-10-CM

## 2018-01-07 DIAGNOSIS — G4733 Obstructive sleep apnea (adult) (pediatric): Secondary | ICD-10-CM

## 2018-01-07 MED ORDER — FLUTICASONE-UMECLIDIN-VILANT 100-62.5-25 MCG/INH IN AEPB: 1.0000 | INHALATION_SPRAY | Freq: Every day | RESPIRATORY_TRACT | 0 refills | Status: AC

## 2018-01-07 MED ORDER — PREDNISONE 10 MG PO TABS: ORAL_TABLET | ORAL | 0 refills | Status: DC

## 2018-01-07 NOTE — Patient Instructions (Signed)
We will give you samples of trelegy and patient assistance forms Will call in a prednisone taper starting at 40 mg.  Reduce dose by 10 mg every 3 days.  Use the prednisone if there is worsening shortness of breath.

## 2018-01-07 NOTE — Telephone Encounter (Signed)
Patient brought in "extending leave of absence" documents today for VS to complete and sign Will have them to VS for review and call pt when completed.

## 2018-01-07 NOTE — Progress Notes (Signed)
Courtney Blackburn    [id number]    03-12-1956  Primary Care Physician:Patient, No Pcp Per  Referring Physician: No referring provider defined for this encounter.  Chief complaint: Dyspnea, cough  HPI: 62 year old with COPD, chronic bronchitis, active smoker. Therapies are limited by finances Follows with Dr. Craige CottaSood.  She has not been using her Atrovent and albuterol nebulizer for more than 2 months as she does not have insurance.  Complains of cough for the past 2 weeks with dyspnea, nonproductive in nature.  Denies any wheeze, sputum production, fevers, chills, myalgia.  She continues to smoke.  Outpatient Encounter Medications as of 01/07/2018  Medication Sig  . omeprazole (PRILOSEC OTC) 20 MG tablet Take 20 mg by mouth daily.  Marland Kitchen. albuterol (PROVENTIL) (2.5 MG/3ML) 0.083% nebulizer solution Take 3 mLs (2.5 mg total) by nebulization every 6 (six) hours as needed for wheezing or shortness of breath. (Patient not taking: Reported on 01/07/2018)  . ipratropium (ATROVENT) 0.02 % nebulizer solution Take 2.5 mLs (0.5 mg total) by nebulization 4 (four) times daily. (Patient not taking: Reported on 01/07/2018)  . Multiple Vitamin (MULTIVITAMIN WITH MINERALS) TABS tablet Take 1 tablet by mouth daily.   No facility-administered encounter medications on file as of 01/07/2018.     Allergies as of 01/07/2018 - Review Complete 01/07/2018  Allergen Reaction Noted  . Codeine Other (See Comments) 10/13/2012    Past Medical History:  Diagnosis Date  . Acid reflux   . Allergy   . Anxiety   . Depression   . Hiatal hernia   . Hypertension     Past Surgical History:  Procedure Laterality Date  . APPENDECTOMY    . TUBAL LIGATION    . WISDOM TOOTH EXTRACTION      Family History  Problem Relation Age of Onset  . Heart disease Mother   . Heart disease Father   . Cancer Sister   . Cancer Brother     Social History   Socioeconomic History  . Marital status: Divorced    Spouse  name: Not on file  . Number of children: Not on file  . Years of education: Not on file  . Highest education level: Not on file  Social Needs  . Financial resource strain: Not on file  . Food insecurity - worry: Not on file  . Food insecurity - inability: Not on file  . Transportation needs - medical: Not on file  . Transportation needs - non-medical: Not on file  Occupational History  . Not on file  Tobacco Use  . Smoking status: Current Every Day Smoker    Packs/day: 0.75    Years: 43.00    Pack years: 32.25    Types: Cigarettes  . Smokeless tobacco: Never Used  Substance and Sexual Activity  . Alcohol use: No  . Drug use: No  . Sexual activity: Not Currently    Birth control/protection: Surgical  Other Topics Concern  . Not on file  Social History Narrative  . Not on file    Review of systems: Review of Systems  Constitutional: Negative for fever and chills.  HENT: Negative.   Eyes: Negative for blurred vision.  Respiratory: as per HPI  Cardiovascular: Negative for chest pain and palpitations.  Gastrointestinal: Negative for vomiting, diarrhea, blood per rectum. Genitourinary: Negative for dysuria, urgency, frequency and hematuria.  Musculoskeletal: Negative for myalgias, back pain and joint pain.  Skin: Negative for itching and rash.  Neurological: Negative for  dizziness, tremors, focal weakness, seizures and loss of consciousness.  Endo/Heme/Allergies: Negative for environmental allergies.  Psychiatric/Behavioral: Negative for depression, suicidal ideas and hallucinations.  All other systems reviewed and are negative.  Physical Exam: Blood pressure 136/82, pulse 77, height 5\' 2"  (1.575 m), weight 148 lb (67.1 kg), last menstrual period 10/02/2012, SpO2 96 %. Gen:      No acute distress HEENT:  EOMI, sclera anicteric Neck:     No masses; no thyromegaly Lungs:    Clear to auscultation bilaterally; normal respiratory effort CV:         Regular rate and rhythm; no  murmurs Abd:      + bowel sounds; soft, non-tender; no palpable masses, no distension Ext:    No edema; adequate peripheral perfusion Skin:      Warm and dry; no rash Neuro: alert and oriented x 3 Psych: normal mood and affect  Data Reviewed: Spirometry 12/25/17 FVC 1.78 [58%], FEV1 1.19 [50%], F/F 67 Severe obstruction  Chest x-ray 04/03/17- low lung volumes, possible infiltrate at the right base.  Small right pleural effusion  Assessment:  Severe COPD Worsening symptoms as she is not on any therapy due to being uninsured.  Cannot afford nebulizers. Symptoms are not suggestive of acute infection I will give a sample of trelegy and patient assistance to get her on some kind of therapy Although she is not wheezing, we will give her short prednisone taper to be kept in reserve in case of worsening dyspnea Defer chest x-ray due to cost involved I asked her to call back if cough is worse or if she develops fevers, chills, mucus  Plan/Recommendations: - Samples of trelegy, patient assistance form - Prednisone taper.  Chilton Greathouse MD [facility] Pager [phone] 01/07/2018, 3:48 PM  CC: No ref. provider found

## 2018-01-08 ENCOUNTER — Ambulatory Visit: Payer: Self-pay | Admitting: Family Medicine

## 2018-01-13 NOTE — Telephone Encounter (Signed)
Pt is calling back about her extending leave of absence paper work  [phone]

## 2018-01-13 NOTE — Telephone Encounter (Signed)
[facility] to follow up on.

## 2018-01-13 NOTE — Telephone Encounter (Signed)
Left VM for patient regarding her paperwork request for update. X1 VS has the paperwork docs for this pt on extended leave of absence with him He is out of the office all week, will return back in the office Monday 01/20/18 Will follow up at that time

## 2018-01-14 NOTE — Telephone Encounter (Signed)
Left VM for patient regarding paperwork documents for extended leave of absence is signed and completed by VS Faxed all docs to [employer] at fax [phone]; phone [phone] Will follow up again to see if fax went through

## 2018-01-15 ENCOUNTER — Ambulatory Visit: Payer: Self-pay | Admitting: Family Medicine

## 2018-01-15 NOTE — Telephone Encounter (Signed)
Pt calling  Wanting a copy of the rec that were faxed yesterday she can be reached @ [phone].Caren GriffinsStanley A Dalton

## 2018-01-17 NOTE — Telephone Encounter (Signed)
Lmtcb x1 for pt  

## 2018-01-17 NOTE — Telephone Encounter (Signed)
Pt is calling back [phone] 

## 2018-01-17 NOTE — Telephone Encounter (Signed)
Called patient unable to reach left message to give us a call back.

## 2018-01-20 NOTE — Telephone Encounter (Signed)
Spoke with patient. She is requesting for the start date to state 11/23/17 and end date to state 02/02/18. This is for question #5. She wants to try to go back to work on 02/03/18.   Dr. Craige CottaSood, please advise if you ok with us writing in this information for you. Will place forms back in VS' cubby.

## 2018-01-20 NOTE — Telephone Encounter (Signed)
Forms have been updated and faxed back.

## 2018-01-20 NOTE — Telephone Encounter (Signed)
Fine with me

## 2018-01-20 NOTE — Telephone Encounter (Signed)
Patient returned call, CB is [phone].

## 2018-01-23 ENCOUNTER — Ambulatory Visit: Payer: Self-pay | Admitting: Family Medicine

## 2018-01-24 ENCOUNTER — Ambulatory Visit: Payer: Self-pay | Admitting: Family Medicine

## 2018-01-27 NOTE — Telephone Encounter (Signed)
Called the disability number of [phone] and spoke with Crystal checking on status of pt's paperwork.  Crystal stated to me they received forms 01/15/18 but stated they were still missing part of the paperwork, #5 which I stated to her we faxed 01/20/18.  Crystal again stated to me they did not have that information. I asked if they could refax the form to us so that we can again redo #5 on there with the start date 11/23/17 having the end date be 02/02/18 due to pt wanting to try to go back to work on 02/03/18.  One fax number we can refax form to once we get it from [employer] is [phone] and an alternate fax number is [phone]  Will route this encounter to [facility] for her to follow up on.

## 2018-01-27 NOTE — Telephone Encounter (Signed)
Pt is calling back about the FML papers [phone]

## 2018-01-28 ENCOUNTER — Telehealth: Payer: Self-pay | Admitting: Pulmonary Disease

## 2018-01-28 NOTE — Telephone Encounter (Signed)
Spoke with pt, advised her that we are awaiting fax from disability so we can fill out with the correct dates. Pt understood.

## 2018-01-28 NOTE — Telephone Encounter (Signed)
Pt is calling back [phone] 

## 2018-01-28 NOTE — Telephone Encounter (Signed)
[facility] that pt refuses to complete ONO Called Melissa; she advised to try to call pt and talk with her to see if she will complete ONO And if Pt would like ONO to call Melissa with [facility] back to let her know  LVM for pt to return call regarding completing ONO or not.

## 2018-01-28 NOTE — Telephone Encounter (Signed)
Called and spoke with patient. Advised patient of the message from earlier in regards to ONO. Patient states that [facility] her and she has not refused anything. Patient states she is wanting to have the ONO done.    Called Melissa, unable to reach. Left message to give us a call back regarding this.

## 2018-01-29 ENCOUNTER — Ambulatory Visit: Payer: Self-pay | Admitting: Family Medicine

## 2018-01-30 NOTE — Telephone Encounter (Signed)
lmtcb x2 for pt. 

## 2018-01-30 NOTE — Telephone Encounter (Signed)
ATC pt, no answer. Left message for pt to call back.  Called Melissa and left message to call back to discuss.

## 2018-01-30 NOTE — Telephone Encounter (Signed)
  Will close encounter, as another has been open.

## 2018-01-30 NOTE — Telephone Encounter (Signed)
Pt is calling back. Cb is [phone].

## 2018-01-30 NOTE — Telephone Encounter (Signed)
Cox, Courtney DeemHeather Blackburn, CMA    2:40 PM  Note    Called and spoke with patient. Advised patient of the message from earlier in regards to ONO. Patient states that [facility] her and she has not refused anything. Patient states she is wanting to have the ONO done.    Called Melissa, unable to reach. Left message to give us a call back regarding this.       Copied above message from duplicate encounter dated at 01/28/18. lmtcb x2 [facility]

## 2018-01-30 NOTE — Telephone Encounter (Signed)
Melissa, [facility] calling back. Cb is [phone].

## 2018-01-31 ENCOUNTER — Telehealth: Payer: Self-pay | Admitting: Pulmonary Disease

## 2018-01-31 ENCOUNTER — Encounter: Payer: Self-pay | Admitting: Pulmonary Disease

## 2018-01-31 NOTE — Telephone Encounter (Signed)
Called and spoke with Melissa with [facility] today Advised her pt is willing to complete ONO; she will have her team reach out to pt today Called and spoke with pt advised her Melissa with [facility] is having [facility] to call to set up ONO today Placed order today for ONO Pt verbalized understanding and had no further questions Nothing further needed at this time.

## 2018-01-31 NOTE — Telephone Encounter (Signed)
Called and spoke with patient regarding FML paperwork  The Forms have been updated properly and refaxed 01/29/18 Waiting to here back from FML forms if approved or not Pt is calling FML forms asking for Crysal at [phone] today  [facility] to [facility] for [facility]/u

## 2018-02-03 NOTE — Telephone Encounter (Signed)
Patient is waiting in lobby; states talked to someone on Friday about faxing over leave of absence paperwork; place of employment is stating paperwork has not been received, and patient is unable to clock in to work this morning

## 2018-02-03 NOTE — Telephone Encounter (Signed)
Courtney [facility]elKelli states that new [employer] forms has been received to be completed by VS, as previous form was completed incorrectly.  I have made pt aware of this information.  Will route to [facility] to f/u on.

## 2018-02-03 NOTE — Telephone Encounter (Signed)
Rec'd [employer] from [employer] blank form in need of #5 to be completed and signed by VS Placed info with VS today explaining #5 on second sheets needs completed Once completed will fax back to [employer] today Will f/u 02-04-2018

## 2018-02-03 NOTE — Telephone Encounter (Signed)
Rec'd [employer] from [employer] blank form in need of #5 to be completed and signed by VS Signed and completed by VS; placed up front desk to be faxed today Called and spoke with pt to let her know this is completed

## 2018-02-05 ENCOUNTER — Encounter: Payer: Self-pay | Admitting: Physician Assistant

## 2018-02-13 ENCOUNTER — Telehealth: Payer: Self-pay | Admitting: Pulmonary Disease

## 2018-02-13 ENCOUNTER — Ambulatory Visit (INDEPENDENT_AMBULATORY_CARE_PROVIDER_SITE_OTHER): Payer: Self-pay | Admitting: Pulmonary Disease

## 2018-02-13 VITALS — BP 160/80 | HR 81

## 2018-02-13 DIAGNOSIS — J441 Chronic obstructive pulmonary disease with (acute) exacerbation: Secondary | ICD-10-CM

## 2018-02-13 MED ORDER — FLUTICASONE-UMECLIDIN-VILANT 100-62.5-25 MCG/INH IN AEPB: 1.0000 | INHALATION_SPRAY | Freq: Every day | RESPIRATORY_TRACT | 0 refills | Status: DC

## 2018-02-13 MED ORDER — AZITHROMYCIN 1 G PO PACK: 1.0000 g | PACK | Freq: Once | ORAL | 0 refills | Status: AC

## 2018-02-13 MED ORDER — PREDNISONE 20 MG PO TABS: 20.0000 mg | ORAL_TABLET | Freq: Every day | ORAL | 0 refills | Status: DC

## 2018-02-13 NOTE — Progress Notes (Signed)
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  Subjective:   PATIENT ID: Courtney Blackburn GENDER: female DOB: [birth year], MRN: [id number]   HPI  Chief Complaint  Patient presents with  . Acute Visit    Chills, wheezing , SOB    "I'm doing better".  However after initially telling me this she says that she has been having chills.  She says she had a fever this morning.  She noted a couth with stratchy throat.  She has "all this yuck" from her sinuses and "feeling like crap".  She thinks she has an infection. She says that two weeks ago has something very similar to her symptoms.  She says she was able to vacuum her house and sweep last night. She had a little dyspnea with this activity but this is normal for her.  She didn't sleep well last night.  She notes anxiety from being out of work and then returning to work.  She says that her temperature this morning was 99.8 and she had associated chills.    She is currently taking no medicines for her COPD right now.  She has no insurance.    She is still smoking cigarettes.  She says there is a program at work to help her with financial assistance for smoking cessation materials.    She started taking zyrtec two nights ago.  Past Medical History:  Diagnosis Date  . Acid reflux   . Allergy   . Anxiety   . Depression   . Hiatal hernia   . Hypertension      Family History  Problem Relation Age of Onset  . Heart disease Mother   . Heart disease Father   . Cancer Sister   . Cancer Brother      Social History   Socioeconomic History  . Marital status: Divorced    Spouse name: Not on file  . Number of children: Not on file  . Years of education: Not on file  . Highest education level: Not on file  Occupational History  . Not on file  Social Needs  . Financial resource strain: Not on file  . Food insecurity:    Worry: Not on file    Inability: Not on file  . Transportation needs:    Medical: Not on file   Non-medical: Not on file  Tobacco Use  . Smoking status: Current Every Day Smoker    Packs/day: 0.75    Years: 43.00    Pack years: 32.25    Types: Cigarettes  . Smokeless tobacco: Never Used  Substance and Sexual Activity  . Alcohol use: No  . Drug use: No  . Sexual activity: Not Currently    Birth control/protection: Surgical  Lifestyle  . Physical activity:    Days per week: Not on file    Minutes per session: Not on file  . Stress: Not on file  Relationships  . Social connections:    Talks on phone: Not on file    Gets together: Not on file    Attends religious service: Not on file    Active member of club or organization: Not on file    Attends meetings of clubs or organizations: Not on file    Relationship status: Not on file  . Intimate partner violence:    Fear of current or ex partner: Not on file    Emotionally abused: Not on file    Physically abused: Not on file    Forced sexual  activity: Not on file  Other Topics Concern  . Not on file  Social History Narrative  . Not on file     Allergies  Allergen Reactions  . Codeine Other (See Comments)    headaches     Outpatient Medications Prior to Visit  Medication Sig Dispense Refill  . albuterol (PROVENTIL) (2.5 MG/3ML) 0.083% nebulizer solution Take 3 mLs (2.5 mg total) by nebulization every 6 (six) hours as needed for wheezing or shortness of breath. 75 mL 12  . ipratropium (ATROVENT) 0.02 % nebulizer solution Take 2.5 mLs (0.5 mg total) by nebulization 4 (four) times daily. 75 mL 12  . Multiple Vitamin (MULTIVITAMIN WITH MINERALS) TABS tablet Take 1 tablet by mouth daily.    Marland Kitchen omeprazole (PRILOSEC OTC) 20 MG tablet Take 20 mg by mouth daily.    . predniSONE (DELTASONE) 10 MG tablet 4 tabs x 3 days, 3 tabs x 3 days, 2 tabs x 3 days, 1 tab x 3 days then stop 20 tablet 0   No facility-administered medications prior to visit.     Review of Systems  Constitutional: Positive for chills, fever and  malaise/fatigue.  HENT: Positive for congestion and sinus pain.   Respiratory: Positive for cough, sputum production, shortness of breath and wheezing.   Cardiovascular: Negative for palpitations, orthopnea and leg swelling.  Psychiatric/Behavioral: Substance abuse: RA.      Objective:  Physical Exam   Vitals:   02/13/18 1209  BP: (!) 160/80  Pulse: 81  SpO2: 97%    RA  Gen: well appearing HENT: OP clear, TM's clear, neck supple PULM: Focal wheeze RUL, normal percussion CV: RRR, no mgr, trace edema GI: BS+, soft, nontender Derm: no cyanosis or rash Psyche: normal mood and affect   CBC    Component Value Date/Time   WBC 16.4 (H) 04/03/2017 0413   RBC 3.61 (L) 04/03/2017 0413   HGB 11.0 (L) 04/03/2017 0413   HCT 35.5 (L) 04/03/2017 0413   PLT 204 04/03/2017 0413   MCV 98.3 04/03/2017 0413   MCV 92.4 03/29/2017 1703   MCH 30.5 04/03/2017 0413   MCHC 31.0 04/03/2017 0413   RDW 14.6 04/03/2017 0413   LYMPHSABS 2.3 03/30/2017 1801   MONOABS 0.5 03/30/2017 1801   EOSABS 0.2 03/30/2017 1801   BASOSABS 0.0 03/30/2017 1801     Chest imaging:  PFT:  Labs:  Path:  Echo:  Heart Catheterization:  Records reviewed from her visit in March where she was treated for COPD exacerbation with prednisone, Trelegy samples were given     Assessment & Plan:   COPD exacerbation (HCC)  Discussion: Courtney Blackburn presents today complaining of multiple subjective abnormalities which are not supported by her objective findings on physical exam.  There does appear to be a  mild focal wheeze in the right upper lobe, but the remainder of her physical exam is within normal limits.  She feels that she has had another COPD exacerbation and considering the fact that she still smoking, not taking controller medicines and has a history of recurrent exacerbations I think it safest to go ahead and treat as if she is having another flare.  Plan: COPD with acute exacerbation: Prednisone 20 mg  daily times 5 days Azithromycin pack times 5 days Trelegy inhaler given today Call us if no improvement in symptoms Stop smoking cigarettes  Follow-up with Dr. Craige Blackburn as previously arranged.    Current Outpatient Medications:  .  albuterol (PROVENTIL) (2.5 MG/3ML) 0.083% nebulizer solution, Take 3  mLs (2.5 mg total) by nebulization every 6 (six) hours as needed for wheezing or shortness of breath., Disp: 75 mL, Rfl: 12 .  ipratropium (ATROVENT) 0.02 % nebulizer solution, Take 2.5 mLs (0.5 mg total) by nebulization 4 (four) times daily., Disp: 75 mL, Rfl: 12 .  Multiple Vitamin (MULTIVITAMIN WITH MINERALS) TABS tablet, Take 1 tablet by mouth daily., Disp: , Rfl:  .  omeprazole (PRILOSEC OTC) 20 MG tablet, Take 20 mg by mouth daily., Disp: , Rfl:  .  predniSONE (DELTASONE) 10 MG tablet, 4 tabs x 3 days, 3 tabs x 3 days, 2 tabs x 3 days, 1 tab x 3 days then stop, Disp: 20 tablet, Rfl: 0

## 2018-02-13 NOTE — Patient Instructions (Signed)
COPD with acute exacerbation: Prednisone 20 mg daily times 5 days Azithromycin pack times 5 days Trelegy inhaler given today Call us if no improvement in symptoms Stop smoking cigarettes  Follow-up with Dr. Craige CottaSood as previously arranged.

## 2018-02-13 NOTE — Telephone Encounter (Signed)
Spoke with Courtney Blackburn about papers and she stated the papers were faxed to [employer] already. It proabaly has not been scanned yet. I advised Heather to let pt know since she is with Dr. Kendrick FriesMcQuaid today. Nothing further is needed.

## 2018-02-17 ENCOUNTER — Ambulatory Visit: Payer: Self-pay | Admitting: Family Medicine

## 2018-02-18 ENCOUNTER — Other Ambulatory Visit: Payer: Self-pay | Admitting: Pulmonary Disease

## 2018-02-18 MED ORDER — AZITHROMYCIN 250 MG PO TABS: ORAL_TABLET | ORAL | 0 refills | Status: DC

## 2018-02-18 NOTE — Telephone Encounter (Signed)
Pt was prescribed zpak on 4/18 visit but "azithromycin packet" to be taken once was sent instead.  Pharmacy sent a fax asking for a new rx for pill instead of packet to be sent in.  This has been sent.  Nothing further needed.

## 2018-02-24 ENCOUNTER — Ambulatory Visit: Payer: Self-pay | Admitting: Pulmonary Disease

## 2018-02-28 ENCOUNTER — Ambulatory Visit: Payer: Self-pay | Admitting: Pulmonary Disease

## 2018-03-03 ENCOUNTER — Encounter: Payer: Self-pay | Admitting: Acute Care

## 2018-03-03 ENCOUNTER — Ambulatory Visit: Payer: Self-pay | Admitting: Acute Care

## 2018-03-03 NOTE — Progress Notes (Deleted)
History of Present Illness Courtney Blackburn is a 62 y.o. female current smoker with COPD. ( 42 pack year smoker). She is followed by Dr. Craige Cotta. Synopsis: [place] seen [facility] [place]. Pt has productive cough- yellow/brown/green thick mucus, wheezing, SOB at rest and w/exertion. Pt has anxiety with panic attacks daily. Pt has chest tightness, upper back pain, swelling in legs and hands in last 32mo.  03/03/2018  Pt was seen by Dr. Craige Cotta as consult for COPD/ bronhitis on 12/25/2017. Plan after that visit was as follows:  Assessment/Plan:  COPD with chronic bronchitis. - therapies are limited by her finances  - will set her up for a nebulizer and albuterol/ipratropium  Tobacco abuse. - reviewed different options to assist with smoking cessation - she is concerned about trying chantix or wellbutrin with her history of anxiety and depression - she will try to enroll with smoking cessation program offered through [employer] where she works   She returns today for her 8 week follow up. She states she is   Test Results:  Spirometry 12/25/17 >> FEV1 1.19 (50%), FEV1% 67    CBC Latest Ref Rng & Units 04/03/2017 04/02/2017 04/01/2017  WBC 4.0 - 10.5 K/uL 16.4(H) 15.7(H) 13.9(H)  Hemoglobin 12.0 - 15.0 g/dL 11.0(L) 11.6(L) 11.9(L)  Hematocrit 36.0 - 46.0 % 35.5(L) 37.4 37.1  Platelets 150 - 400 K/uL 204 216 201    BMP Latest Ref Rng & Units 04/03/2017 04/02/2017 04/01/2017  Glucose 65 - 99 mg/dL 84 97 161(W)  BUN 6 - 20 mg/dL Creatinine 0.44 - 1.00 mg/dL 9.60 4.54 0.98  BUN/Creat Ratio 9 - 23 - - -  Sodium 135 - 145 mmol/L 139 139 133(L)  Potassium 3.5 - 5.1 mmol/L 3.6 4.8 3.7  Chloride 101 - 111 mmol/L 102 101 98(L)  CO2 22 - 32 mmol/L Calcium 8.9 - 10.3 mg/dL 1.1(B) 1.4(N) 8.2(N)    BNP No results found for: BNP  ProBNP No results found for: PROBNP  PFT No results found for: FEV1PRE, FEV1POST, FVCPRE, FVCPOST, TLC, DLCOUNC, PREFEV1FVCRT, PSTFEV1FVCRT  No results  found.   Past medical hx Past Medical History:  Diagnosis Date  . Acid reflux   . Allergy   . Anxiety   . Depression   . Hiatal hernia   . Hypertension      Social History   Tobacco Use  . Smoking status: Current Every Day Smoker    Packs/day: 0.75    Years: 43.00    Pack years: 32.25    Types: Cigarettes  . Smokeless tobacco: Never Used  Substance Use Topics  . Alcohol use: No  . Drug use: No    Ms.Shakir reports that she has been smoking cigarettes.  She has a 32.25 pack-year smoking history. She has never used smokeless tobacco. She reports that she does not drink alcohol or use drugs.  Tobacco Cessation: Current every day smoker 1 pack per day. With a 42 pack year smoking history. I have spent 4 minutes counseling patient on smoking cessation this visit.  Past surgical hx, Family hx, Social hx all reviewed.  Current Outpatient Medications on File Prior to Visit  Medication Sig  . albuterol (PROVENTIL) (2.5 MG/3ML) 0.083% nebulizer solution Take 3 mLs (2.5 mg total) by nebulization every 6 (six) hours as needed for wheezing or shortness of breath.  Marland Kitchen azithromycin (ZITHROMAX) 250 MG tablet Take 2 today, then 1 daily until gone.  . Fluticasone-Umeclidin-Vilant (TRELEGY ELLIPTA)  100-62.5-25 MCG/INH AEPB Inhale 1 puff into the lungs daily.  Marland Kitchen ipratropium (ATROVENT) 0.02 % nebulizer solution Take 2.5 mLs (0.5 mg total) by nebulization 4 (four) times daily.  . Multiple Vitamin (MULTIVITAMIN WITH MINERALS) TABS tablet Take 1 tablet by mouth daily.  Marland Kitchen omeprazole (PRILOSEC OTC) 20 MG tablet Take 20 mg by mouth daily.  . predniSONE (DELTASONE) 10 MG tablet 4 tabs x 3 days, 3 tabs x 3 days, 2 tabs x 3 days, 1 tab x 3 days then stop  . predniSONE (DELTASONE) 20 MG tablet Take 1 tablet (20 mg total) by mouth daily with breakfast.   No current facility-administered medications on file prior to visit.      Allergies  Allergen Reactions  . Codeine Other (See Comments)     headaches    Review Of Systems:  Constitutional:   No  weight loss, night sweats,  Fevers, chills, fatigue, or  lassitude.  HEENT:   No headaches,  Difficulty swallowing,  Tooth/dental problems, or  Sore throat,                No sneezing, itching, ear ache, nasal congestion, post nasal drip,   CV:  No chest pain,  Orthopnea, PND, swelling in lower extremities, anasarca, dizziness, palpitations, syncope.   GI  No heartburn, indigestion, abdominal pain, nausea, vomiting, diarrhea, change in bowel habits, loss of appetite, bloody stools.   Resp: No shortness of breath with exertion or at rest.  No excess mucus, no productive cough,  No non-productive cough,  No coughing up of blood.  No change in color of mucus.  No wheezing.  No chest wall deformity  Skin: no rash or lesions.  GU: no dysuria, change in color of urine, no urgency or frequency.  No flank pain, no hematuria   MS:  No joint pain or swelling.  No decreased range of motion.  No back pain.  Psych:  No change in mood or affect. No depression or anxiety.  No memory loss.   Vital Signs LMP 10/02/2012    Physical Exam:  General- No distress,  A&Ox3 ENT: No sinus tenderness, TM clear, pale nasal mucosa, no oral exudate,no post nasal drip, no LAN Cardiac: S1, S2, regular rate and rhythm, no murmur Chest: No wheeze/ rales/ dullness; no accessory muscle use, no nasal flaring, no sternal retractions Abd.: Soft Non-tender Ext: No clubbing cyanosis, edema Neuro:  normal strength Skin: No rashes, warm and dry Psych: normal mood and behavior   Assessment/Plan  No problem-specific Assessment & Plan notes found for this encounter.    Bevelyn Ngo, NP 03/03/2018  8:23 AM

## 2018-03-20 ENCOUNTER — Telehealth: Payer: Self-pay | Admitting: Pulmonary Disease

## 2018-03-20 NOTE — Telephone Encounter (Signed)
Spoke with pt. She is aware of Dr. Evlyn Courier response. States that she doesn't have a PCP but will find one. Nothing further was needed.

## 2018-03-20 NOTE — Telephone Encounter (Signed)
She should discuss this with her PCP.

## 2018-03-20 NOTE — Telephone Encounter (Signed)
Spoke with Courtney Blackburn. She was very tearful on the phone. States that she was fired from her job. Courtney Blackburn would like to know if Dr. Sood woulCraige Cotta be willing to give her something for her anxiety.  Dr. Craige Cotta - please advise. Thanks.

## 2018-03-20 NOTE — Telephone Encounter (Signed)
I called Courtney Blackburn he has no idea what the referral support is about they contacted the patient on 01/01/18 pt said she wasn't doing it she had no insurance so they tried again in April and she Didnt call back till today they will contact her again

## 2018-03-24 ENCOUNTER — Encounter: Payer: Self-pay | Admitting: Family Medicine

## 2018-03-30 ENCOUNTER — Ambulatory Visit (HOSPITAL_COMMUNITY)
Admission: EM | Admit: 2018-03-30 | Discharge: 2018-03-30 | Disposition: A | Discharge status: Home / Self Care | Payer: Self-pay | Attending: Emergency Medicine | Admitting: Emergency Medicine

## 2018-03-30 ENCOUNTER — Encounter (HOSPITAL_COMMUNITY): Payer: Self-pay | Admitting: Emergency Medicine

## 2018-03-30 ENCOUNTER — Other Ambulatory Visit: Payer: Self-pay

## 2018-03-30 DIAGNOSIS — R059 Cough, unspecified: Secondary | ICD-10-CM

## 2018-03-30 DIAGNOSIS — R05 Cough: Secondary | ICD-10-CM

## 2018-03-30 DIAGNOSIS — J40 Bronchitis, not specified as acute or chronic: Secondary | ICD-10-CM

## 2018-03-30 DIAGNOSIS — Z207 Contact with and (suspected) exposure to pediculosis, acariasis and other infestations: Secondary | ICD-10-CM

## 2018-03-30 MED ORDER — PERMETHRIN 1 % EX LOTN: 1.0000 "application " | TOPICAL_LOTION | Freq: Once | CUTANEOUS | 0 refills | Status: DC

## 2018-03-30 MED ORDER — PERMETHRIN 1 % EX LOTN: TOPICAL_LOTION | CUTANEOUS | 0 refills | Status: DC

## 2018-03-30 MED ORDER — PREDNISONE 50 MG PO TABS: 50.0000 mg | ORAL_TABLET | Freq: Every day | ORAL | 0 refills | Status: AC

## 2018-03-30 MED ORDER — BENZONATATE 200 MG PO CAPS: 200.0000 mg | ORAL_CAPSULE | Freq: Three times a day (TID) | ORAL | 0 refills | Status: AC | PRN

## 2018-03-30 MED ORDER — AZITHROMYCIN 250 MG PO TABS: 250.0000 mg | ORAL_TABLET | Freq: Every day | ORAL | 0 refills | Status: DC

## 2018-03-30 NOTE — ED Triage Notes (Signed)
Cough, chills, loss of appetite for approximately a week.

## 2018-03-30 NOTE — Discharge Instructions (Addendum)
Begin Azithromycin Prednisone daily for 5 days Tessalon for cough  Prior to application, wash hair with conditioner-free shampoo; rinse with water and towel dry. Apply a sufficient amount of lotion or cream rinse to saturate the hair and scalp (especially behind the ears and nape of neck). Leave on hair for 10 minutes (but no longer), then rinse off with warm water; remove remaining nits with nit comb. A single application is generally sufficient; however, may repeat 7 days after first treatment if lice or nits are still present.

## 2018-03-31 ENCOUNTER — Telehealth (HOSPITAL_COMMUNITY): Payer: Self-pay | Admitting: Emergency Medicine

## 2018-03-31 NOTE — ED Provider Notes (Signed)
[facility]    CSN: [id number] Arrival date & time: 03/30/18  1946     History   Chief Complaint Chief Complaint  Patient presents with  . Cough    HPI Courtney Blackburn is a 62 y.o. female history of hypertension, COPD and tobacco use use presenting today for evaluation of cough as well as possible lice exposure.  Patient states over the past week she has had a persistent cough, fatigue, decreased appetite.  Cough is nonproductive.  Denies any measured fevers.  Has had hot and cold chills.  She has not been taking anything for her symptoms.  Patient does continue to smoke.  Denies sore throat and rhinorrhea.  Denies nausea or vomiting.  Patient here with her granddaughter who is being treated for lice.  Has had an itchy scalp and sleeps in the same bed as her granddaughter.  HPI  Past Medical History:  Diagnosis Date  . Acid reflux   . Allergy   . Anxiety   . Depression   . Hiatal hernia   . Hypertension     Patient Active Problem List   Diagnosis Date Noted  . Encephalopathy acute   . Community acquired pneumonia of left lower lobe of lung (HCC) 03/31/2017  . Acute hypercapnic respiratory failure (HCC) 03/31/2017  . COPD (chronic obstructive pulmonary disease) (HCC) 03/31/2017  . Tobacco use disorder 03/15/2017  . Chronic bronchitis (HCC) 03/15/2017  . Essential hypertension 02/14/2016  . Depression 02/14/2016    Past Surgical History:  Procedure Laterality Date  . APPENDECTOMY    . TUBAL LIGATION    . WISDOM TOOTH EXTRACTION      OB History    Gravida  1   Para      Term      Preterm      AB      Living  1     SAB      TAB      Ectopic      Multiple      Live Births               Home Medications    Prior to Admission medications   Medication Sig Start Date End Date Taking? Authorizing Provider  azithromycin (ZITHROMAX) 250 MG tablet Take 1 tablet (250 mg total) by mouth daily. Take first 2 tablets together, then 1  every day until finished. 03/30/18   Wieters, Hallie C, PA-C  benzonatate (TESSALON) 200 MG capsule Take 1 capsule (200 mg total) by mouth 3 (three) times daily as needed for up to 7 days for cough. 03/30/18 04/06/18  Wieters, Hallie C, PA-C  permethrin (ELIMITE) 1 % lotion Shampoo, rinse and towel dry hair, saturate hair and scalp with permethrin. Rinse after 10 min; repeat in 1 week if needed 03/30/18   Wieters, Hallie C, PA-C  predniSONE (DELTASONE) 50 MG tablet Take 1 tablet (50 mg total) by mouth daily for 5 days. 03/30/18 04/04/18  Wieters, Junius Creamer, PA-C    Family History Family History  Problem Relation Age of Onset  . Heart disease Mother   . Heart disease Father   . Cancer Sister   . Cancer Brother     Social History Social History   Tobacco Use  . Smoking status: Current Every Day Smoker    Packs/day: 1.00    Years: 43.00    Pack years: 43.00    Types: Cigarettes  . Smokeless tobacco: Never Used  Substance Use Topics  .  Alcohol use: No  . Drug use: No     Allergies   Codeine   Review of Systems Review of Systems  Constitutional: Positive for appetite change and fatigue. Negative for chills and fever.  HENT: Positive for congestion. Negative for ear pain, rhinorrhea, sinus pressure, sore throat and trouble swallowing.   Respiratory: Positive for cough. Negative for chest tightness and shortness of breath.   Cardiovascular: Negative for chest pain.  Gastrointestinal: Negative for abdominal pain, nausea and vomiting.  Musculoskeletal: Negative for myalgias.  Skin: Negative for rash.  Neurological: Positive for headaches. Negative for dizziness and light-headedness.     Physical Exam Triage Vital Signs ED Triage Vitals  Enc Vitals Group     BP 03/30/18 2035 (!) 145/55     Pulse Rate 03/30/18 2035 86     Resp 03/30/18 2035 18     Temp 03/30/18 2035 98.3 F (36.8 C)     Temp Source 03/30/18 2035 Oral     SpO2 03/30/18 2035 99 %     Weight --      Height --       Head Circumference --      Peak Flow --      Pain Score 03/30/18 2032 7     Pain Loc --      Pain Edu? --      Excl. in GC? --    No data found.  Updated Vital Signs BP (!) 145/55 (BP Location: Left Arm)   Pulse 86   Temp 98.3 F (36.8 C) (Oral)   Resp 18   LMP 10/02/2012   SpO2 99%   Visual Acuity Right Eye Distance:   Left Eye Distance:   Bilateral Distance:    Right Eye Near:   Left Eye Near:    Bilateral Near:     Physical Exam  Constitutional: She appears well-developed and well-nourished. No distress.  HENT:  Head: Normocephalic and atraumatic.  Mouth/Throat: Oropharynx is clear and moist.  Bilateral ears without tenderness to palpation of external auricle, tragus and mastoid, EAC's without erythema or swelling, TM's with good bony landmarks and cone of light. Non erythematous.  Oral mucosa pink and moist, no tonsillar enlargement or exudate. Posterior pharynx patent and nonerythematous, no uvula deviation or swelling. Normal phonation.  Significant amount of dandruff seen in scalp, no nits or active bugs.  Eyes: Conjunctivae are normal.  Neck: Neck supple.  Cardiovascular: Normal rate and regular rhythm.  No murmur heard. Pulmonary/Chest: Effort normal and breath sounds normal. No respiratory distress.  Breathing comfortably at rest, wheezing present in left upper lobe, no rales or other adventitious sounds auscultated   Abdominal: Soft. There is no tenderness.  Musculoskeletal: She exhibits no edema.  Neurological: She is alert.  Skin: Skin is warm and dry.  Psychiatric: She has a normal mood and affect.  Nursing note and vitals reviewed.    UC Treatments / Results  Labs (all labs ordered are listed, but only abnormal results are displayed) Labs Reviewed - No data to display  EKG None  Radiology No results found.  Procedures Procedures (including critical care time)  Medications Ordered in UC Medications - No data to display  Initial  Impression / Assessment and Plan / UC Course  I have reviewed the triage vital signs and the nursing notes.  Pertinent labs & imaging results that were available during my care of the patient were reviewed by me and considered in my medical decision making (see chart for  details).     We will treat patient for acute on chronic bronchitis given setting of COPD.  Azithromycin and prednisone and Tessalon.  We will go ahead and empirically treat for lice with permethrin at the same time as her granddaughter.Discussed strict return precautions. Patient verbalized understanding and is agreeable with plan.  Final Clinical Impressions(s) / UC Diagnoses   Final diagnoses:  Exposure to head lice  Cough  Bronchitis     Discharge Instructions     Begin Azithromycin Prednisone daily for 5 days Tessalon for cough  Prior to application, wash hair with conditioner-free shampoo; rinse with water and towel dry. Apply a sufficient amount of lotion or cream rinse to saturate the hair and scalp (especially behind the ears and nape of neck). Leave on hair for 10 minutes (but no longer), then rinse off with warm water; remove remaining nits with nit comb. A single application is generally sufficient; however, may repeat 7 days after first treatment if lice or nits are still present.   ED Prescriptions    Medication Sig Dispense Auth. Provider   permethrin (ELIMITE) 1 % lotion  (Status: Discontinued) Apply 1 application topically once for 1 dose. Shampoo, rinse and towel dry hair, saturate hair and scalp with permethrin. Rinse after 10 min; repeat in 1 week if needed 59 mL Wieters, Hallie C, PA-C   azithromycin (ZITHROMAX) 250 MG tablet Take 1 tablet (250 mg total) by mouth daily. Take first 2 tablets together, then 1 every day until finished. 6 tablet Wieters, Hallie C, PA-C   predniSONE (DELTASONE) 50 MG tablet Take 1 tablet (50 mg total) by mouth daily for 5 days. 5 tablet Wieters, Hallie C, PA-C    benzonatate (TESSALON) 200 MG capsule Take 1 capsule (200 mg total) by mouth 3 (three) times daily as needed for up to 7 days for cough. 28 capsule Wieters, Hallie C, PA-C   permethrin (ELIMITE) 1 % lotion Shampoo, rinse and towel dry hair, saturate hair and scalp with permethrin. Rinse after 10 min; repeat in 1 week if needed 59 mL Wieters, Hallie C, PA-C     Controlled Substance Prescriptions [place] Controlled Substance Registry consulted? Not Applicable   Lew Dawes, [place] 03/31/18 1610

## 2018-03-31 NOTE — Telephone Encounter (Signed)
Pt called clinic with questions about her prescriptions she received last night.  Called pt back and left generic VM that said it was [place]

## 2018-04-04 ENCOUNTER — Encounter: Payer: Self-pay | Admitting: Pulmonary Disease

## 2018-04-08 ENCOUNTER — Telehealth: Payer: Self-pay | Admitting: Pulmonary Disease

## 2018-04-08 DIAGNOSIS — J449 Chronic obstructive pulmonary disease, unspecified: Secondary | ICD-10-CM

## 2018-04-08 NOTE — Telephone Encounter (Signed)
ONO with RA 04/04/18 >> test time 5 hrs 1 min.  Average SpO2 87%, low SpO2 70%.  Spent 3 hrs 19 min with SpO2 < 88%.   Please let her know that her ONO shows low oxygen level at night.  She needs to get started on 2 liters oxygen at night.  Please place order for this, and will then discuss in more detail at her ROV on 04/14/18.

## 2018-04-09 NOTE — Telephone Encounter (Signed)
Attempted to call pt. I did not receive an answer. I have left a message for pt to return our call.  

## 2018-04-10 NOTE — Telephone Encounter (Signed)
Spoke with pt. She is aware of results. Order has been placed for oxygen therapy. Nothing further was needed.

## 2018-04-10 NOTE — Telephone Encounter (Signed)
Attempted to call patient today regarding results. I did not receive an answer at time of call. I have left a voicemail message for pt to return call. X2  

## 2018-04-10 NOTE — Telephone Encounter (Signed)
Pt is calling back   [phone]

## 2018-04-14 ENCOUNTER — Ambulatory Visit (INDEPENDENT_AMBULATORY_CARE_PROVIDER_SITE_OTHER): Payer: Self-pay | Admitting: Pulmonary Disease

## 2018-04-14 ENCOUNTER — Ambulatory Visit (INDEPENDENT_AMBULATORY_CARE_PROVIDER_SITE_OTHER)
Admission: RE | Admit: 2018-04-14 | Discharge: 2018-04-14 | Disposition: A | Discharge status: Home / Self Care | Payer: Self-pay | Source: Ambulatory Visit | Attending: Pulmonary Disease | Admitting: Pulmonary Disease

## 2018-04-14 ENCOUNTER — Encounter: Payer: Self-pay | Admitting: Pulmonary Disease

## 2018-04-14 ENCOUNTER — Other Ambulatory Visit (INDEPENDENT_AMBULATORY_CARE_PROVIDER_SITE_OTHER): Payer: Self-pay

## 2018-04-14 ENCOUNTER — Telehealth: Payer: Self-pay | Admitting: Pulmonary Disease

## 2018-04-14 VITALS — BP 122/82 | HR 84 | Ht 62.0 in | Wt 150.0 lb

## 2018-04-14 DIAGNOSIS — J449 Chronic obstructive pulmonary disease, unspecified: Secondary | ICD-10-CM

## 2018-04-14 DIAGNOSIS — J9611 Chronic respiratory failure with hypoxia: Secondary | ICD-10-CM

## 2018-04-14 LAB — BASIC METABOLIC PANEL
BUN: 11 mg/dL (ref 6–23)
CO2: 33 mEq/L — ABNORMAL HIGH (ref 19–32)
Calcium: 9.6 mg/dL (ref 8.4–10.5)
Chloride: 103 mEq/L (ref 96–112)
Creatinine, Ser: 0.9 mg/dL (ref 0.40–1.20)
GFR: 67.66 mL/min (ref >60.00)
Glucose, Bld: 87 mg/dL (ref 70–99)
POTASSIUM: 4.5 meq/L (ref 3.5–5.1)
SODIUM: 143 meq/L (ref 135–145)

## 2018-04-14 LAB — CBC
HCT: 33.3 % — ABNORMAL LOW (ref 36.0–46.0)
Hemoglobin: 10.7 g/dL — ABNORMAL LOW (ref 12.0–15.0)
MCHC: 32.1 g/dL (ref 30.0–36.0)
MCV: 86.1 fl (ref 78.0–100.0)
Platelets: 269 10*3/uL (ref 150.0–400.0)
RBC: 3.87 Mil/uL (ref 3.87–5.11)
RDW: 17.4 % — ABNORMAL HIGH (ref 11.5–15.5)
WBC: 8.3 10*3/uL (ref 4.0–10.5)

## 2018-04-14 MED ORDER — ALBUTEROL SULFATE (2.5 MG/3ML) 0.083% IN NEBU: 2.5000 mg | INHALATION_SOLUTION | Freq: Four times a day (QID) | RESPIRATORY_TRACT | 5 refills | Status: DC | PRN

## 2018-04-14 NOTE — Telephone Encounter (Signed)
Forwarding to [facility] as FYI.  

## 2018-04-14 NOTE — Telephone Encounter (Signed)
[facility] is here for [facility]t now.

## 2018-04-14 NOTE — Patient Instructions (Signed)
Lab tests and chest xray today Will arrange for home nebulizer machine Albuterol 1 vial nebulized every 6 hours as needed for cough, wheeze, or chest congestion Will set up home oxygen to use at night at 2 liters Follow up in 2 months

## 2018-04-14 NOTE — Progress Notes (Signed)
[facility]  Chief Complaint  Patient presents with  . Follow-up    Pt has increase of SOB, wheezing, and chest tightness. Pt is not breathing well at times. Pt has more difficulties at night time, increase SOB before bed time.    Vital signs: BP 122/82 (BP Location: Left Arm, Cuff Size: Normal)   Pulse 84   Ht 5\' 2"  (1.575 m)   Wt 150 lb (68 kg)   LMP 10/02/2012   SpO2 97%   BMI 27.44 kg/m   History of Present Illness: Courtney Blackburn is a 62 y.o. female smoker with COPD.  Since her last visit she lost her job.  She also got into trouble when taking her grandchild to school.  She was accused of being drunk.  She has more trouble with her breathing.  She gets cough, wheeze, and sputum.  She gets more confused when her breathing gets worse.  She is still smoking.  ONO showed low oxygen at night.  She is trying to apply for medicaid and disability.    Physical Exam:  General - pleasant Eyes - pupils reactive ENT - no sinus tenderness, no oral exudate, no LAN, raspy voice Cardiac - regular, no murmur Chest - decreased BS, no wheeze, rales Abd - soft, non tender Ext - no edema Skin - no rashes Neuro - normal strength Psych - normal mood  Assessment/Plan:  COPD with chronic bronchitis. - financial restraints limit her therapy options - will try to arrange for home nebulizer and albuterol - might need to put her on prednisone chronically for now  Confusion. - likely related to intermittent hypoxia - could also be retaining CO2 - will check labs, CXR to start with  Tobacco abuse. - reviewed options to assist with smoking cessation   Patient Instructions  Lab tests and chest xray today Will arrange for home nebulizer machine Albuterol 1 vial nebulized every 6 hours as needed for cough, wheeze, or chest congestion Will set up home oxygen to use at night at 2 liters Follow up in 2 months    Coralyn Helling, MD [facility]  Pulmonary/Critical Care 04/14/2018, 11:29 AM Pager:  [phone]  Flow Sheet  Pulmonary tests: Spirometry 12/25/17 >> FEV1 1.19 (50%), FEV1% 67  Sleep tests: ONO with RA 04/04/18 >> test time 5 hrs 1 min.  Average SpO2 87%, low SpO2 70%.  Spent 3 hrs 19 min with SpO2 < 88%.  Past Medical History: She  has a past medical history of Acid reflux, Allergy, Anxiety, Depression, Hiatal hernia, and Hypertension.  Past Surgical History: She  has a past surgical history that includes Tubal ligation; Appendectomy; and Wisdom tooth extraction.  Family History: Her family history includes Cancer in her brother and sister; Heart disease in her father and mother.  Social History: She  reports that she has been smoking cigarettes.  She has a 43.00 pack-year smoking history. She has never used smokeless tobacco. She reports that she does not drink alcohol or use drugs.  Medications: Allergies as of 04/14/2018      Reactions   Codeine Other (See Comments)   headaches      Medication List        Accurate as of 04/14/18 11:29 AM. Always use your most recent med list.          albuterol (2.5 MG/3ML) 0.083% nebulizer solution Commonly known as:  PROVENTIL Take 3 mLs (2.5 mg total) by nebulization every 6 (six) hours as  needed for wheezing or shortness of breath.

## 2018-04-15 ENCOUNTER — Telehealth: Payer: Self-pay | Admitting: Pulmonary Disease

## 2018-04-15 NOTE — Telephone Encounter (Signed)
Dg Chest 2 View  Result Date: 04/14/2018 CLINICAL DATA:  Several month history of pressure sensation in the chest with increasing severity. History of COPD, hypertension, current smoker. Previous episode of pneumonia EXAM: CHEST - 2 VIEW COMPARISON:  PA and lateral chest x-ray of April 03, 2017, October 30, 2015, and November 19, 2013. FINDINGS: The lungs are mildly hyperinflated and clear. The heart and pulmonary vascularity are normal. The mediastinum is normal in width. There is no pleural effusion. There is calcification in the wall of the aortic arch. The bony thorax exhibits no acute abnormality. IMPRESSION: Chronic bronchitic-smoking related changes. No alveolar pneumonia nor CHF. Thoracic aortic atherosclerosis. Electronically Signed   By: David  [place]az[place] M.D.   On: 04/14/2018 13:41     Please let her know her chest xray shows expected changes of COPD.  No other worrisome findings.  No change to current tx plan.

## 2018-04-15 NOTE — Telephone Encounter (Signed)
Noted.  Will discuss in more detail at her next visit.

## 2018-04-15 NOTE — Telephone Encounter (Signed)
Attempted to call patient today regarding results. I did not receive an answer at time of call. I have left a voicemail message for pt to return call. X1  

## 2018-04-16 NOTE — Telephone Encounter (Signed)
Attempted to call patient today regarding results. I did not receive an answer at time of call. I have left a voicemail message for pt to return call. X2  

## 2018-04-18 NOTE — Telephone Encounter (Signed)
Attempted to call patient today regarding results. I did not receive an answer at time of call. I have left a voicemail message for pt to return call. X3 Mailed letter to pt today to call our office back for results.

## 2018-04-22 NOTE — Progress Notes (Deleted)
Patient has been made aware of results. Nothing further needed. 04/30/18 HCC      Attempted to call patient today regarding results. I did not receive an answer at time of call. I have left a voicemail message for pt to return call. X1

## 2018-04-28 ENCOUNTER — Encounter: Payer: Self-pay | Admitting: Pulmonary Disease

## 2018-04-28 ENCOUNTER — Telehealth: Payer: Self-pay | Admitting: Pulmonary Disease

## 2018-04-28 ENCOUNTER — Ambulatory Visit (INDEPENDENT_AMBULATORY_CARE_PROVIDER_SITE_OTHER): Payer: Self-pay | Admitting: Pulmonary Disease

## 2018-04-28 VITALS — BP 128/80 | HR 77 | Ht 62.0 in | Wt 150.0 lb

## 2018-04-28 DIAGNOSIS — J4489 Other specified chronic obstructive pulmonary disease: Secondary | ICD-10-CM

## 2018-04-28 DIAGNOSIS — J9611 Chronic respiratory failure with hypoxia: Secondary | ICD-10-CM

## 2018-04-28 DIAGNOSIS — F17200 Nicotine dependence, unspecified, uncomplicated: Secondary | ICD-10-CM

## 2018-04-28 DIAGNOSIS — I10 Essential (primary) hypertension: Secondary | ICD-10-CM

## 2018-04-28 DIAGNOSIS — J449 Chronic obstructive pulmonary disease, unspecified: Secondary | ICD-10-CM

## 2018-04-28 DIAGNOSIS — F331 Major depressive disorder, recurrent, moderate: Secondary | ICD-10-CM

## 2018-04-28 MED ORDER — UMECLIDINIUM-VILANTEROL 62.5-25 MCG/INH IN AEPB: 1.0000 | INHALATION_SPRAY | Freq: Every day | RESPIRATORY_TRACT | 0 refills | Status: DC

## 2018-04-28 NOTE — Assessment & Plan Note (Signed)
Anoro Ellipta inhaler  >>>1 puff in the morning >>>daily   YOU NEED TO APPLY FOR MEDICAID / SOCIAL SECURITY / DISABILITY  You also need to apply for section 8 housing as she will no longer have a place to live after next week  [facility] [address] [place], [place] [address]  [phone]   Social Security office [address] [place] [address] [place], [place] [address] [phone]  If you have worsening symptoms of depression, anxiety, or any thoughts of hurting yourself or others call 911, present to the emergency room, or contact behavioral health for an emergency visit.  We have placed a referral for case management >>> They will call you  1 800 QUIT NOW  >>> Patient to call this resource and utilize it to help support her quit smoking >>> Keep up your hard work with your smoking   You can also contact the [facility] >>>For smoking cessation classes call [phone]

## 2018-04-28 NOTE — Patient Instructions (Addendum)
Anoro Ellipta inhaler  >>>1 puff in the morning >>>daily   YOU NEED TO APPLY FOR MEDICAID / SOCIAL SECURITY / DISABILITY  You also need to apply for section 8 housing as she will no longer have a place to live after next week  [facility] [address] [place], [place] [address]  [phone]   Social Security office [address] [place], [place] [address] [phone]  If you have worsening symptoms of depression, anxiety, or any thoughts of hurting yourself or others call 911, present to the emergency room, or contact behavioral health for an emergency visit.  We have placed a referral for case management >>> They will call you  1 800 QUIT NOW  >>> Patient to call this resource and utilize it to help support her quit smoking >>> Keep up your hard work with your smoking   You can also contact the [facility] >>>For smoking cessation classes call [phone]     If your symptoms worsen, have issues or concerns about high blood pressure, concerns about your mental health status please present to an emergency room or call 911.  When in the emergency room please notify them of your financial needs as well as concerns regarding health and housing.   Please contact the office if your symptoms worsen or you have concerns that you are not improving.   Thank you for choosing [facility] for your healthcare, and for allowing us to partner with you on your healthcare journey. I am thankful to be able to provide care to you today.   Elisha HeadlandBrian Mack FNP-C

## 2018-04-28 NOTE — Progress Notes (Signed)
Reviewed and agree with assessment/plan.   Chritopher Coster, MD [facility] 10/24/2016, 12:24 PM Pager:  [phone]  

## 2018-04-28 NOTE — Assessment & Plan Note (Signed)
Continue to monitor blood pressure If symptoms worsen her blood pressure results increase patient to follow-up with local health department or emergency room

## 2018-04-28 NOTE — Assessment & Plan Note (Signed)
Discussed extensively with patient current symptoms of depression.  Patient denies homicidal or suicidal thoughts.  Patient is to monitor for the symptoms.  If patient starts to exhibit suicidal or homicidal thoughts or depression then patient needs to call 911, present to the emergency room, or contact behavioral health and be seen.

## 2018-04-28 NOTE — Assessment & Plan Note (Signed)
   1 800 QUIT NOW  >>> Patient to call this resource and utilize it to help support her quit smoking >>> Keep up your hard work with your smoking   You can also contact the [facility] >>>For smoking cessation classes call [phone]

## 2018-04-28 NOTE — Progress Notes (Signed)
$'@Patient'R$  ID: Courtney Blackburn, female    DOB: 21-Apr-1956, 62 y.o.   MRN: [id number]  Chief Complaint  Patient presents with  . Acute Visit    Increased SOB, tremors, and chest tightness x 1 week. Dry cough, slurred speech. Increased fatigue.     Referring provider: No ref. provider found  HPI: Courtney Blackburn is a 62 y.o. female smoker with COPD.    43 pack-year history.  Recent  Pulmonary Encounters:   04/14/18-office visit-Sood Patient reports the office saying she recently lost her job as well as had difficulties picking her grandchild up from school where she was accused to being drunk.  Discussed with patient need for management of COPD, also need to stop smoking. Plan: Chest x-ray, check labs, set up for home oxygen 2 L  Tests:   04/14/2018 chest x-ray-chronic bronchitic changes  Pulmonary tests: Spirometry 12/25/17 >> FEV1 1.19 (50%), FEV1% 67  Sleep tests: ONO with RA 04/04/18 >>test time 5 hrs 1 min. Average SpO2 87%, low SpO2 70%. Spent 3 hrs 19 min with SpO2 <88%.     04/28/18 Acute 62 year old patient of Dr. Halford Chessman.  Patient presents today with daughter as well as grandchild.  Patient reported she is currently off of all of her medic, due to financial reasons.  Patient reports she saw Dr. said 2 weeks ago and he encouraged her to apply for local community resources but this is not been done.  Patient and daughter reporting they both will be evicted within the next week.  Patient and daughter unsure where to start with finding resources.  Patient is requesting nerve pill to help her with anxiety.  Patient is still actively smoking 1 pack a day.  Patient is reporting poor sleep and says that she only sleeps 2 to 3 hours at night.    Allergies  Allergen Reactions  . Codeine Other (See Comments)    headaches     There is no immunization history on file for this patient.  Past Medical History:  Diagnosis Date  . Acid reflux   . Allergy   .  Anxiety   . Depression   . Hiatal hernia   . Hypertension     Tobacco History: Social History   Tobacco Use  Smoking Status Current Every Day Smoker  . Packs/day: 1.00  . Years: 43.00  . Pack years: 43.00  . Types: Cigarettes  Smokeless Tobacco Never Used  Tobacco Comment   pack/day 04/28/18   Ready to quit: Yes Counseling given: Yes Comment: pack/day 04/28/18 Continue to recommend to stop smoking.  It is very difficult for [place] to manage your respiratory status if you continue to smoke.  Patient reports she is been doing reduced to quit.   [address] [place]  >>> Patient to call this resource and utilize it to help support her quit smoking >>> Keep up your hard work with your smoking  You can also contact the [facility] >>>For smoking cessation classes call [phone]   Outpatient Encounter Medications as of 04/28/2018  Medication Sig  . albuterol (PROVENTIL) (2.5 MG/3ML) 0.083% nebulizer solution Take 3 mLs (2.5 mg total) by nebulization every 6 (six) hours as needed for wheezing or shortness of breath. (Patient not taking: Reported on 04/28/2018)  . umeclidinium-vilanterol (ANORO ELLIPTA) 62.5-25 MCG/INH AEPB Inhale 1 puff into the lungs daily.   No facility-administered encounter medications on file as of 04/28/2018.      Review of Systems  Constitutional:  +  fatigue, poor sleep  No  weight loss, night sweats,  fevers, chills, HEENT:  +dry mouth   No headaches,  Difficulty swallowing,  Tooth/dental problems, or  Sore throat, No sneezing, itching, ear ache, nasal congestion, post nasal drip  CV: No chest pain,  orthopnea, PND, swelling in lower extremities, anasarca, dizziness, palpitations, syncope  GI: No heartburn, indigestion, abdominal pain, nausea, vomiting, diarrhea, change in bowel habits, loss of appetite, bloody stools Resp: + Shortness of breath with rest as well as with exertion, cough, productive yellow mucus no shortness of breath with exertion or at  rest.    No coughing up of blood.  No change in color of mucus.  No wheezing.  No chest wall deformity Skin: no rash, lesions, no skin changes. GU: no dysuria, change in color of urine, no urgency or frequency.  No flank pain, no hematuria  MS:  No joint pain or swelling.  No decreased range of motion.  No back pain. Psych: + Increased sadness and anxiety, no change in mood or affect. No memory loss.   Physical Exam  BP 128/80   Pulse 77   Ht '5\' 2"'$  (1.575 m)   Wt 150 lb (68 kg)   LMP 10/02/2012   SpO2 92%   BMI 27.44 kg/m    Wt Readings from Last 3 Encounters:  04/28/18 150 lb (68 kg)  04/14/18 150 lb (68 kg)  01/07/18 148 lb (67.1 kg)    GEN: A/Ox3; pleasant , NAD, well nourished, chronically ill     HEENT:  [place]/AT,  EACs-clear, TMs-wnl, NOSE-clear, THROAT- +post nasal drip Sinus - non tender to palpation    NECK:  Supple w/ fair ROM; no JVD; normal carotid impulses w/o bruits; no thyromegaly or nodules palpated; +slight left submandibular lymphadenopathy.    RESP:  Clear  P & A; diminished breath sounds, w/o, wheezes/ rales/ or rhonchi. no accessory muscle use, no dullness to percussion  CARD:  RRR, no m/r/g, no peripheral edema, pulses intact, no cyanosis or clubbing.  GI:   Soft & nt; nml bowel sounds; no organomegaly or masses detected.   Musco: Warm bil, no deformities or joint swelling noted.   Neuro: alert, no focal deficits noted.    Skin: Warm, no lesions or rashes    Lab Results:  CBC    Component Value Date/Time   WBC 8.3 04/14/2018 1202   RBC 3.87 04/14/2018 1202   HGB 10.7 (L) 04/14/2018 1202   HCT 33.3 (L) 04/14/2018 1202   PLT 269.0 04/14/2018 1202   MCV 86.1 04/14/2018 1202   MCV 92.4 03/29/2017 1703   MCH 30.5 04/03/2017 0413   MCHC 32.1 04/14/2018 1202   RDW 17.4 (H) 04/14/2018 1202   LYMPHSABS 2.3 03/30/2017 1801   MONOABS 0.5 03/30/2017 1801   EOSABS 0.2 03/30/2017 1801   BASOSABS 0.0 03/30/2017 1801    BMET    Component Value  Date/Time   NA 143 04/14/2018 1202   NA 142 03/29/2017 1649   K 4.5 04/14/2018 1202   CL 103 04/14/2018 1202   CO2 33 (H) 04/14/2018 1202   GLUCOSE 87 04/14/2018 1202   BUN 11 04/14/2018 1202   BUN 9 03/29/2017 1649   CREATININE 0.90 04/14/2018 1202   CALCIUM 9.6 04/14/2018 1202   GFRNONAA >60 04/03/2017 0413   GFRAA >60 04/03/2017 0413    BNP No results found for: BNP  ProBNP No results found for: PROBNP  Imaging: Dg Chest 2 View  Result Date: 04/14/2018  CLINICAL DATA:  Several month history of pressure sensation in the chest with increasing severity. History of COPD, hypertension, current smoker. Previous episode of pneumonia EXAM: CHEST - 2 VIEW COMPARISON:  PA and lateral chest x-ray of April 03, 2017, October 30, 2015, and November 19, 2013. FINDINGS: The lungs are mildly hyperinflated and clear. The heart and pulmonary vascularity are normal. The mediastinum is normal in width. There is no pleural effusion. There is calcification in the wall of the aortic arch. The bony thorax exhibits no acute abnormality. IMPRESSION: Chronic bronchitic-smoking related changes. No alveolar pneumonia nor CHF. Thoracic aortic atherosclerosis. Electronically Signed   By: David  Martinique M.D.   On: 04/14/2018 13:41     Assessment & Plan:   Pleasant patient seen in office today.  Unfortunately patient has a myriad of financial and psychosocial issues which complicate the management of her health care.  Will refer patient to case management.  Also provided resources for health department as well as Social Security office.  Discussed with patient that she needs to apply for section 8 housing, Medicaid, and utilize local resources to help manage health.  Anoro Ellipta samples provided today this patient.  Emphasized the importance of patient stopping smoking as patient is still smoking 1 pack/day.   We will not do any prescriptions for benzodiazepines at this time.  Patient can present to health  department and be seen for management of her anxiety.  Also discussed the patient needs to present to behavioral health if symptoms worsen.  Once patient qualifies for Medicaid or obtains health coverage then we can proceed forward with overnight oxygen order.   Essential hypertension Continue to monitor blood pressure If symptoms worsen her blood pressure results increase patient to follow-up with local health department or emergency room  COPD (chronic obstructive pulmonary disease) (HCC) Anoro Ellipta inhaler  >>>1 puff in the morning >>>daily   YOU NEED TO APPLY FOR MEDICAID / SOCIAL SECURITY / DISABILITY  You also need to apply for section 8 housing as she will no longer have a place to live after next week  [facility] [address] [place], [place] [address]  [place] [address] [place], [place] [address] [phone]  If you have worsening symptoms of depression, anxiety, or any thoughts of hurting yourself or others call 911, present to the emergency room, or contact behavioral health for an emergency visit.  We have placed a referral for case management >>> They will call you  1 800 QUIT NOW  >>> Patient to call this resource and utilize it to help support her quit smoking >>> Keep up your hard work with your smoking   You can also contact the [facility] >>>For smoking cessation classes call [phone]     Tobacco use disorder   1 800 QUIT NOW  >>> Patient to call this resource and utilize it to help support her quit smoking >>> Keep up your hard work with your smoking   You can also contact the [facility] >>>For smoking cessation classes call [phone]     Depression Discussed extensively with patient current symptoms of depression.  Patient denies homicidal or suicidal thoughts.  Patient is to monitor for the symptoms.  If patient starts to exhibit suicidal  or homicidal thoughts or depression then patient needs to call 911, present to the emergency room, or contact behavioral health and be seen.     Lauraine Rinne, NP  04/28/2018  

## 2018-04-28 NOTE — Telephone Encounter (Signed)
Courtney Blackburn, Vineet, MD     Dg Chest 2 View  Result Date: 04/14/2018 CLINICAL DATA:  Several month history of pressure sensation in the chest with increasing severity. History of COPD, hypertension, current smoker. Previous episode of pneumonia EXAM: CHEST - 2 VIEW COMPARISON:  PA and lateral chest x-ray of April 03, 2017, October 30, 2015, and November 19, 2013. FINDINGS: The lungs are mildly hyperinflated and clear. The heart and pulmonary vascularity are normal. The mediastinum is normal in width. There is no pleural effusion. There is calcification in the wall of the aortic arch. The bony thorax exhibits no acute abnormality. IMPRESSION: Chronic bronchitic-smoking related changes. No alveolar pneumonia nor CHF. Thoracic aortic atherosclerosis. Electronically Signed   By: David  [place] M.D.   On: 04/14/2018 13:41     Please let her know her chest xray shows expected changes of COPD.  No other worrisome findings.  No change to current tx plan.     --------------------------------- Spoke with pt. She is aware of results. Nothing further was needed.

## 2018-06-16 ENCOUNTER — Ambulatory Visit: Payer: Self-pay | Admitting: Pulmonary Disease

## 2018-06-20 ENCOUNTER — Ambulatory Visit (INDEPENDENT_AMBULATORY_CARE_PROVIDER_SITE_OTHER): Payer: Self-pay | Admitting: Pulmonary Disease

## 2018-06-20 ENCOUNTER — Ambulatory Visit: Payer: Self-pay | Admitting: Pulmonary Disease

## 2018-06-20 ENCOUNTER — Encounter: Payer: Self-pay | Admitting: Pulmonary Disease

## 2018-06-20 VITALS — BP 124/72 | HR 94 | Ht 62.0 in | Wt 150.0 lb

## 2018-06-20 DIAGNOSIS — J449 Chronic obstructive pulmonary disease, unspecified: Secondary | ICD-10-CM

## 2018-06-20 DIAGNOSIS — F17200 Nicotine dependence, unspecified, uncomplicated: Secondary | ICD-10-CM

## 2018-06-20 MED ORDER — PREDNISONE 5 MG PO TABS: 5.0000 mg | ORAL_TABLET | Freq: Every day | ORAL | 3 refills | Status: DC

## 2018-06-20 NOTE — Patient Instructions (Signed)
Prednisone 5 mg daily  Will have you speak with patient care coordinator about getting financial assistance to set up home oxygen  Will call once your letter is ready for disability application  Follow up in 6 weeks

## 2018-06-20 NOTE — Progress Notes (Signed)
Courtney Blackburn, Courtney Blackburn, Courtney Blackburn  Chief Complaint  Patient presents with  . Follow-up    2 month follow up. Patient states she was supposed to receive overnight O2 but has not yet.     Constitutional: BP 124/72 (BP Location: Left Arm, Patient Position: Sitting, Cuff Size: Normal)   Pulse 94   Ht 5\' 2"  (1.575 m)   Wt 150 lb (68 kg)   LMP 10/02/2012   SpO2 91%   BMI 27.44 kg/m   History of Present Illness: Courtney Blackburn is a 62 y.o. female smoker with COPD.  She is struggling with her finances.  She has applied for disability.  She can't afford home oxygen set up.  She is still having confusional arousals.  She is now homeless Courtney staying with friends Courtney family, but she doesn't know how long she will be able to do this.  She is worried she will have to live in a shelter again.  These issues are making her feel depressed.  Constitutional Exam:  Appearance - tearful ENMT - nasal mucosa moist, turbinates clear, midline nasal septum, no dental lesions, no gingival bleeding, no oral exudates, no tonsillar hypertrophy Neck - no masses, trachea midline, no thyromegaly, no elevation in JVP Respiratory - normal appearance of chest wall, normal respiratory effort w/o accessory muscle use, no dullness on percussion, decreased breath sounds, no wheeze/rales CV - s1s2 regular rate Courtney rhythm, no murmurs, no peripheral edema, radial pulses symmetric GI - soft, non tender, no masses Lymph - no adenopathy noted in neck Courtney axillary areas MSK - normal muscle strength Courtney tone, normal gait Ext - no cyanosis, clubbing, or joint inflammation noted Skin - no rashes, lesions, or ulcers Neuro - oriented to person, place, Courtney time Psych - anxious  Assessment/Plan:  COPD with chronic bronchitis. - she is applying for disability - she can't afford her medications or supplemental oxygen - will have her use 5 mg prednisone daily for now - will write a letter for her to apply  for disability  Confusional episodes. - likely relate to hypoxia - will see if she can get financial assistance for home oxygen set up  Tobacco abuse. - she will try to quit on her own   Patient Instructions  Prednisone 5 mg daily  Will have you speak with patient Blackburn coordinator about getting financial assistance to set up home oxygen  Will call once your letter is ready for disability application  Follow up in 6 weeks    Coralyn HellingVineet Albertina Leise, MD [facility]/Courtney Blackburn 06/20/2018, 3:56 PM Pager:  [phone]  Flow Sheet  Blackburn tests: Spirometry 12/25/17 >> FEV1 1.19 (50%), FEV1% 67  Sleep tests: ONO with RA 04/04/18 >> test time 5 hrs 1 min.  Average SpO2 87%, low SpO2 70%.  Spent 3 hrs 19 min with SpO2 < 88%.  Past Medical History: She  has a past medical history of Acid reflux, Allergy, Anxiety, Depression, Hiatal hernia, Courtney Hypertension.  Past Surgical History: She  has a past surgical history that includes Tubal ligation; Appendectomy; Courtney Wisdom tooth extraction.  Family History: Her family history includes Cancer in her brother Courtney sister; Heart disease in her father Courtney mother.  Social History: She  reports that she has been smoking cigarettes. She has a 43.00 pack-year smoking history. She has never used smokeless tobacco. She reports that she does not drink alcohol or use drugs.  Medications: Allergies as of 06/20/2018      Reactions  Codeine Other (See Comments)   headaches      Medication List        Accurate as of 06/20/18  3:56 PM. Always use your most recent med list.          predniSONE 5 MG tablet Commonly known as:  DELTASONE Take 1 tablet (5 mg total) by mouth daily with breakfast.

## 2018-06-25 ENCOUNTER — Encounter: Payer: Self-pay | Admitting: Pulmonary Disease

## 2018-06-25 ENCOUNTER — Telehealth: Payer: Self-pay | Admitting: Pulmonary Disease

## 2018-06-25 NOTE — Telephone Encounter (Signed)
Attempted to call patient today regarding disability assessment letter ready for p/u. Placed letter up front office in brown folder ready for pt to p/u. I did not receive an answer at time of call. I have left a voicemail message for pt to return call. X1

## 2018-06-25 NOTE — Telephone Encounter (Signed)
I have typed a letter for patient to take with her for disability assessment.  Can you please print and then I can sign.  Can then call patient to let her know it is ready to be picked up.

## 2018-06-26 NOTE — Telephone Encounter (Signed)
Attempted to call patient today regarding disability assessment letter ready for p/u. Placed letter up front office in brown folder ready for pt to p/u. I did not receive an answer at time of call. I have left a voicemail message forptto return call. X2

## 2018-06-27 NOTE — Telephone Encounter (Signed)
LMTCB

## 2018-07-01 NOTE — Telephone Encounter (Signed)
Attempted to call patient today regarding results. I have left a voicemail message for pt to return call. X3 We have left three messages for pt to call our office back regarding results. No response at this time, a letter has been placed in mail today for pt to call our office. Mailed letter of communication today. Nothing further needed at this time.    

## 2018-07-07 ENCOUNTER — Telehealth: Payer: Self-pay | Admitting: Pulmonary Disease

## 2018-07-07 NOTE — Telephone Encounter (Signed)
ATC pt, no answer. Left message for pt to call back.  

## 2018-07-08 NOTE — Telephone Encounter (Signed)
Advised pt that we do not know of any patient assistance for oxygen. She stated she will try to apply for medicaid. Advised her to let us know if there is anything we can do to help her.

## 2018-07-08 NOTE — Telephone Encounter (Signed)
We discussed this with the patient when she was here and also told Dr Craige Cotta that there is no type of assistance for Oxygen unfortunately

## 2018-07-08 NOTE — Telephone Encounter (Signed)
Called and spoke with patient, she states that she is having a hard time finding someone to help with patient assistance as far her getting her o2. She is unable to afford it on her own. Per VS avs she was to see the [facility] for this to talk about getting help. [facility] were you able to find any information on helping this patient she stated that someone was to give her a call back. Will also route to KW to follow up on. I have also called and left Barbara Cower with [facility] a message.

## 2018-07-15 NOTE — Telephone Encounter (Signed)
LVM for pt to return call. X1 

## 2018-07-17 ENCOUNTER — Telehealth: Payer: Self-pay | Admitting: General Surgery

## 2018-07-17 ENCOUNTER — Ambulatory Visit (INDEPENDENT_AMBULATORY_CARE_PROVIDER_SITE_OTHER): Payer: Self-pay | Admitting: Nurse Practitioner

## 2018-07-17 ENCOUNTER — Encounter: Payer: Self-pay | Admitting: Nurse Practitioner

## 2018-07-17 VITALS — BP 136/88 | HR 78 | Ht 62.0 in | Wt 144.0 lb

## 2018-07-17 DIAGNOSIS — J441 Chronic obstructive pulmonary disease with (acute) exacerbation: Secondary | ICD-10-CM

## 2018-07-17 DIAGNOSIS — J0100 Acute maxillary sinusitis, unspecified: Secondary | ICD-10-CM

## 2018-07-17 MED ORDER — AMOXICILLIN-POT CLAVULANATE 875-125 MG PO TABS: 1.0000 | ORAL_TABLET | Freq: Two times a day (BID) | ORAL | 0 refills | Status: DC

## 2018-07-17 MED ORDER — FLUTICASONE-UMECLIDIN-VILANT 100-62.5-25 MCG/INH IN AEPB: 1.0000 | INHALATION_SPRAY | Freq: Every day | RESPIRATORY_TRACT | 0 refills | Status: DC

## 2018-07-17 NOTE — Progress Notes (Signed)
@Patient  ID: Courtney Blackburn, female    DOB: 07-06-1956, 62 y.o.   MRN: [id number]  Chief Complaint  Patient presents with  . Cough    Finally productive 2 days ago - congestion is cleared    Referring provider: No ref. provider found  HPI 62 year old female smoker with COPD followed by Dr. Craige CottaSood.  Tests: Pulmonary tests: Spirometry 12/25/17 >> FEV1 1.19 (50%), FEV1% 67  Sleep tests: ONO with RA 04/04/18 >>test time 5 hrs 1 min. Average SpO2 87%, low SpO2 70%. Spent 3 hrs 19 min with SpO2 <88%.  OV 07/17/18 - Sinus and chest congestion Patient presents with sinus congestion, pressure, and pain. She also complains of chest congestion and productive cough.. Patient states that she had not been able to get the prednisone ordered by Dr. Craige CottaSood until 3 days ago. She states that since she started the prednisone symptoms have improved slightly. She denies any fever, shortness of breath, or chest pain. States that she has post nasal drip. She has has also ran out of her trelegy. She states that she has stopped smoking at this time, but her husband still smokes in the home.       Allergies  Allergen Reactions  . Codeine Other (See Comments)    headaches     There is no immunization history on file for this patient.  Past Medical History:  Diagnosis Date  . Acid reflux   . Allergy   . Anxiety   . Depression   . Hiatal hernia   . Hypertension     Tobacco History: Social History   Tobacco Use  Smoking Status Current Every Day Smoker  . Packs/day: 1.00  . Years: 43.00  . Pack years: 43.00  . Types: Cigarettes  Smokeless Tobacco Never Used  Tobacco Comment   pack/day 04/28/18   Ready to quit: Yes Counseling given: Yes Comment: pack/day 04/28/18   Outpatient Encounter Medications as of 07/17/2018  Medication Sig  . predniSONE (DELTASONE) 5 MG tablet Take 1 tablet (5 mg total) by mouth daily with breakfast.  . amoxicillin-clavulanate (AUGMENTIN) 875-125 MG tablet  Take 1 tablet by mouth 2 (two) times daily.  . Fluticasone-Umeclidin-Vilant (TRELEGY ELLIPTA) 100-62.5-25 MCG/INH AEPB Inhale 1 puff into the lungs daily.   No facility-administered encounter medications on file as of 07/17/2018.      Review of Systems  Review of Systems  Constitutional: Negative.  Negative for chills and fever.  HENT: Positive for congestion, postnasal drip, sinus pressure and sinus pain.   Respiratory: Positive for cough and shortness of breath.   Cardiovascular: Negative.  Negative for chest pain, palpitations and leg swelling.  Gastrointestinal: Negative.   Allergic/Immunologic: Negative.   Neurological: Negative.   Psychiatric/Behavioral: Negative.        Physical Exam  BP 136/88 (BP Location: Left Arm, Patient Position: Sitting, Cuff Size: Normal)   Pulse 78   Ht 5\' 2"  (1.575 m)   Wt 144 lb (65.3 kg)   LMP 10/02/2012   SpO2 97%   BMI 26.34 kg/m   Wt Readings from Last 5 Encounters:  07/17/18 144 lb (65.3 kg)  06/20/18 150 lb (68 kg)  04/28/18 150 lb (68 kg)  04/14/18 150 lb (68 kg)  01/07/18 148 lb (67.1 kg)     Physical Exam  Constitutional: She is oriented to person, place, and time. She appears well-developed and well-nourished. No distress.  Cardiovascular: Normal rate and regular rhythm.  Pulmonary/Chest: Effort normal and breath sounds normal.  Congested cough noted.   Neurological: She is alert and oriented to person, place, and time.  Psychiatric: She has a normal mood and affect.  Nursing note and vitals reviewed.    Assessment & Plan:   Acute non-recurrent maxillary sinusitis Patient Instructions  Will give another sample of trelegy Samples of mucinex and delsym given Will order Augmentin Continue prednisone Follow up with Dr. Craige Cotta in 4 weeks or sooner if needed    COPD exacerbation [facility]) Patient Instructions  Will give another sample of trelegy Samples of mucinex and delsym given Will order Augmentin Continue  prednisone Follow up with Dr. Craige Cotta in 4 weeks or sooner if needed       Courtney Andrew, NP 07/18/2018

## 2018-07-17 NOTE — Patient Instructions (Signed)
Will give another sample of trelegy Samples of mucinex and delsym given Will order Augmentin Continue prednisone Follow up with Dr. Craige CottaSood in 4 weeks or sooner if needed

## 2018-07-17 NOTE — Telephone Encounter (Signed)
Attempted to call patient. I did not receive an answer at time of call. I have left a voicemail message for pt to return call. X2

## 2018-07-18 ENCOUNTER — Encounter: Payer: Self-pay | Admitting: Nurse Practitioner

## 2018-07-18 ENCOUNTER — Encounter: Payer: Self-pay | Admitting: General Surgery

## 2018-07-18 DIAGNOSIS — J0100 Acute maxillary sinusitis, unspecified: Secondary | ICD-10-CM | POA: Insufficient documentation

## 2018-07-18 NOTE — Telephone Encounter (Signed)
Left 2nd vmail for patient to advise if she was not able to pick up the medication issued during OV on 07/17/18 due to cost.

## 2018-07-18 NOTE — Assessment & Plan Note (Signed)
Patient Instructions  Will give another sample of trelegy Samples of mucinex and delsym given Will order Augmentin Continue prednisone Follow up with Dr. Craige CottaSood in 4 weeks or sooner if needed

## 2018-07-18 NOTE — Assessment & Plan Note (Signed)
Patient Instructions  Will give another sample of trelegy Samples of mucinex and delsym given Will order Augmentin Continue prednisone Follow up with Dr. Sood in 4 weeks or sooner if needed   

## 2018-07-18 NOTE — Progress Notes (Signed)
Reviewed and agree with assessment/plan.   Joniel Graumann, MD [place] [facility] 10/24/2016, 12:24 PM Pager:  [phone]  

## 2018-07-21 NOTE — Telephone Encounter (Signed)
Attempted to call patient today regarding results. I have left a voicemail message for pt to return call. X3 We have left three messages for pt to call our office back regarding results. No response at this time, a letter has been placed in mail today for pt to call our office. Mailed letter of communication today. Nothing further needed at this time.    

## 2018-08-04 ENCOUNTER — Ambulatory Visit: Payer: Self-pay | Admitting: Pulmonary Disease

## 2018-08-04 ENCOUNTER — Ambulatory Visit: Payer: Self-pay | Admitting: Nurse Practitioner

## 2018-08-06 ENCOUNTER — Ambulatory Visit: Payer: Self-pay | Admitting: Nurse Practitioner

## 2018-08-19 ENCOUNTER — Telehealth: Payer: Self-pay | Admitting: Pulmonary Disease

## 2018-08-19 NOTE — Telephone Encounter (Signed)
ATC the patient left message to remind her of the disability letter that is upfront ready for her to pick up.

## 2018-09-22 ENCOUNTER — Telehealth: Payer: Self-pay | Admitting: Pulmonary Disease

## 2018-09-22 MED ORDER — NYSTATIN 100000 UNIT/ML MT SUSP: 5.0000 mL | Freq: Four times a day (QID) | OROMUCOSAL | 0 refills | Status: AC

## 2018-09-22 NOTE — Telephone Encounter (Signed)
Can send script for nystatin swish and swallow 100,000 units  5ml qid x 5 days.

## 2018-09-22 NOTE — Telephone Encounter (Signed)
Was in hospital in [place] for hypoxic respiratory failure, [facility]  Pt was also sent home on doxycycline, has now developed oral thrush.  C/o painful white patches in mouth Xapprox 3 days.  Has not taken anything to help with s/s.    No pcp to follow up with.  Has appt on 12/6 with VS.    Pharmacy: [facility] in [place].   VS please advise on recs.  Thanks!

## 2018-09-22 NOTE — Telephone Encounter (Signed)
Spoke with pt. She is aware of Dr. Sood's recommendation. Rx has been sent in. Nothing further was needed. 

## 2018-10-03 ENCOUNTER — Encounter: Payer: Self-pay | Admitting: Pulmonary Disease

## 2018-10-03 ENCOUNTER — Ambulatory Visit (INDEPENDENT_AMBULATORY_CARE_PROVIDER_SITE_OTHER): Payer: Self-pay | Admitting: Pulmonary Disease

## 2018-10-03 VITALS — BP 136/86 | HR 72 | Ht 62.0 in | Wt 143.0 lb

## 2018-10-03 DIAGNOSIS — F17200 Nicotine dependence, unspecified, uncomplicated: Secondary | ICD-10-CM

## 2018-10-03 DIAGNOSIS — J449 Chronic obstructive pulmonary disease, unspecified: Secondary | ICD-10-CM

## 2018-10-03 DIAGNOSIS — J9611 Chronic respiratory failure with hypoxia: Secondary | ICD-10-CM

## 2018-10-03 MED ORDER — DULOXETINE HCL 30 MG PO CPEP: 30.0000 mg | ORAL_CAPSULE | Freq: Every day | ORAL | 3 refills | Status: DC

## 2018-10-03 NOTE — Progress Notes (Signed)
[place] [facility]  Chief Complaint  Patient presents with  . Follow-up    Pt is doing well overall. Pt is using O2 con't at 2.5L    Constitutional:  BP 136/86 (BP Location: Left Arm, Cuff Size: Normal)   Pulse 72   Ht 5\' 2"  (1.575 m)   Wt 143 lb (64.9 kg)   LMP 10/02/2012   SpO2 99%   BMI 26.16 kg/m   Past Medical History:  GERD, Anxiety, Depression, Hiatal hernia, HTN  Brief Summary:  Courtney Blackburn is a 62 y.o. female smoker with COPD.  [facility] few weeks ago.  Feeling better now.  Down to 10 cigarettes per day.  Planning to use nicotine patch.  Has been feeling more anxious and depressed.  Doesn't have cymbalta anymore.  Was doing better when on this.  She is applying for medicaid and disability.  She is also in the process of getting a new apartment.  She was started on 2.5 liters oxygen from the hospital.    She is not currently having cough, wheeze, sputum, chest pain, or leg swelling.  She doesn't have a PCP now, but is [facility] once she gets medicaid approved.   Physical Exam:   Appearance - wearing oxygen  ENMT - clear nasal mucosa, midline nasal  septum, no oral exudates, no LAN, trachea midline  Respiratory - normal chest wall, normal respiratory effort, no accessory muscle use, no wheeze/rales  CV - s1s2 regular rate and rhythm, no murmurs, no peripheral edema, radial pulses symmetric  GI - soft, non tender, no masses  Lymph - no adenopathy noted in neck and axillary areas  MSK - normal gait  Ext - no cyanosis, clubbing, or joint inflammation noted  Skin - no rashes, lesions, or ulcers  Neuro - normal strength, oriented x 3  Psych - normal mood and affect   Assessment/Plan:   COPD with chronic bronchitis. - samples of trelegy given - prn duoneb  Chronic respiratory failure with hypoxia. - from COPD - continue 2.5 liters  oxygen  Tobacco abuse. - she will try using nicotine patch  Depression with anxiety. - refilled cymbalta  Lack of insurance. - she has applied for medicaid and disability   Patient Instructions  Trelegy one puff daily  Duoneb one vial nebulized every 6 hours as needed for cough, wheeze, chest congestion, or shortness of breath  Cymbalta (duloxetine) 30 mg daily  Follow up in 2 months with Dr. Craige CottaSood or Nurse Practitioner    Coralyn HellingVineet Arvell Pulsifer, MD [place] [facility] Pager: [phone] 10/03/2018, 10:32 AM  Flow Sheet     Pulmonary tests:  Spirometry 12/25/17 >> FEV1 1.19 (50%), FEV1% 67  Sleep tests:  ONO with RA 04/04/18 >>test time 5 hrs 1 min. Average SpO2 87%, low SpO2 70%. Spent 3 hrs 19 min with SpO2 <88%.  Medications:   Allergies as of 10/03/2018      Reactions   Codeine Other (See Comments)   headaches      Medication List        Accurate as of 10/03/18 10:32 AM. Always use your most recent med list.          DULoxetine 30 MG capsule Commonly known as:  CYMBALTA Take 1 capsule (30 mg total) by mouth daily.   Fluticasone-Umeclidin-Vilant 100-62.5-25 MCG/INH Aepb Inhale 1 puff into the lungs daily.   ipratropium-albuterol 0.5-2.5 (3) MG/3ML Soln  Commonly known as:  DUONEB Take 3 mLs by nebulization every 6 (six) hours as needed.       Past Surgical History:  She  has a past surgical history that includes Tubal ligation; Appendectomy; and Wisdom tooth extraction.  Family History:  Her family history includes Cancer in her brother and sister; Heart disease in her father and mother.  Social History:  She  reports that she has been smoking cigarettes. She has a 43.00 pack-year smoking history. She has never used smokeless tobacco. She reports that she does not drink alcohol or use drugs.

## 2018-10-03 NOTE — Patient Instructions (Addendum)
Trelegy one puff daily  Duoneb one vial nebulized every 6 hours as needed for cough, wheeze, chest congestion, or shortness of breath  Cymbalta (duloxetine) 30 mg daily  Follow up in 2 months with Dr. Craige CottaSood or Nurse Practitioner

## 2018-12-08 ENCOUNTER — Ambulatory Visit: Payer: Self-pay | Admitting: Pulmonary Disease

## 2019-01-27 NOTE — Congregational Nurse Program (Signed)
Stated she has history of COPD and uses o2 as needed. CO edema of left eye and ear. Told will make appointment with [facility]. No acute distress. BP 186/84 P 69. Discussed problems with having HTN. Explained importance of eating a heart healthy diet, getting exercise and taking prescribed medications.  12-31-18 TCT [facility],spoke with Dois Davenport appointment scheduled for 01-02-19 at 8:30am To bring  ID, SS#, [employer] and list of medications. TCT client and information given voice mail left with Bonita Quin

## 2019-03-30 ENCOUNTER — Other Ambulatory Visit: Payer: Self-pay | Admitting: Nurse Practitioner

## 2019-03-30 ENCOUNTER — Other Ambulatory Visit: Payer: Self-pay | Admitting: Pulmonary Disease

## 2019-03-30 MED ORDER — DULOXETINE HCL 30 MG PO CPEP: 30.0000 mg | ORAL_CAPSULE | Freq: Every day | ORAL | 3 refills | Status: DC

## 2019-03-30 NOTE — Telephone Encounter (Signed)
Yes. This is fine. Dr. Craige Cotta has been prescribing this for patient

## 2019-03-30 NOTE — Telephone Encounter (Signed)
Called and spoke with pt who is requesting a refill of Cymbalta 60mg . Med last filled for pt 10/03/18 #30 with [facility]  Tonya, please advise if you are okay refilling med for pt since she saw you prior to VS. I have pended medication. Med needs to be sent to [facility].

## 2019-03-31 NOTE — Telephone Encounter (Signed)
Refill submitted Nothing further needed. 

## 2019-05-31 IMAGING — DX DG CHEST 2V
2 series · 2 of 2 positions shown · non-contrast
Comparison: Chest radiograph 10/30/2015.

CLINICAL DATA: Patient with cough for 6 months.  Lethargy.

EXAM:
CHEST  2 VIEW

[chest lat]
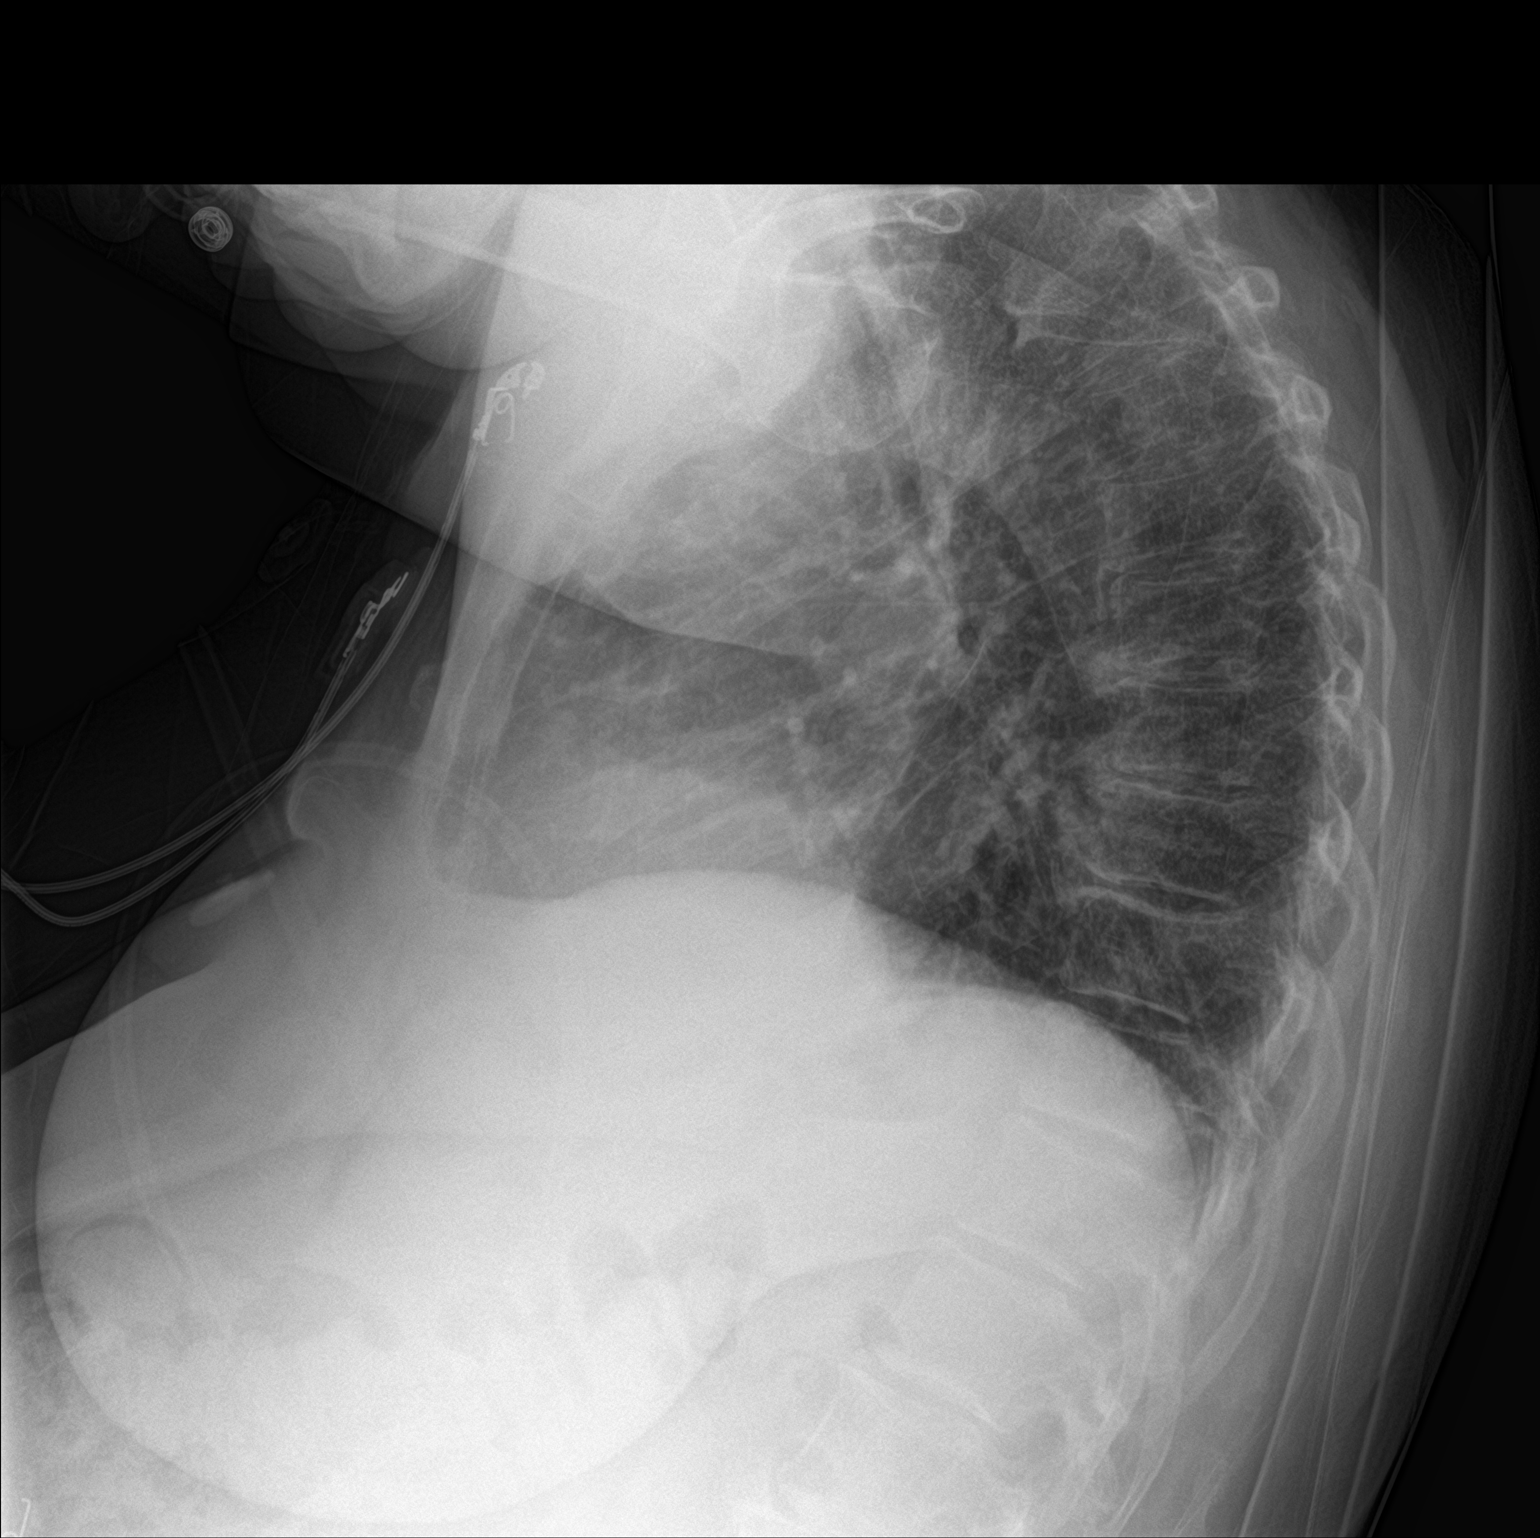

[chest ap]
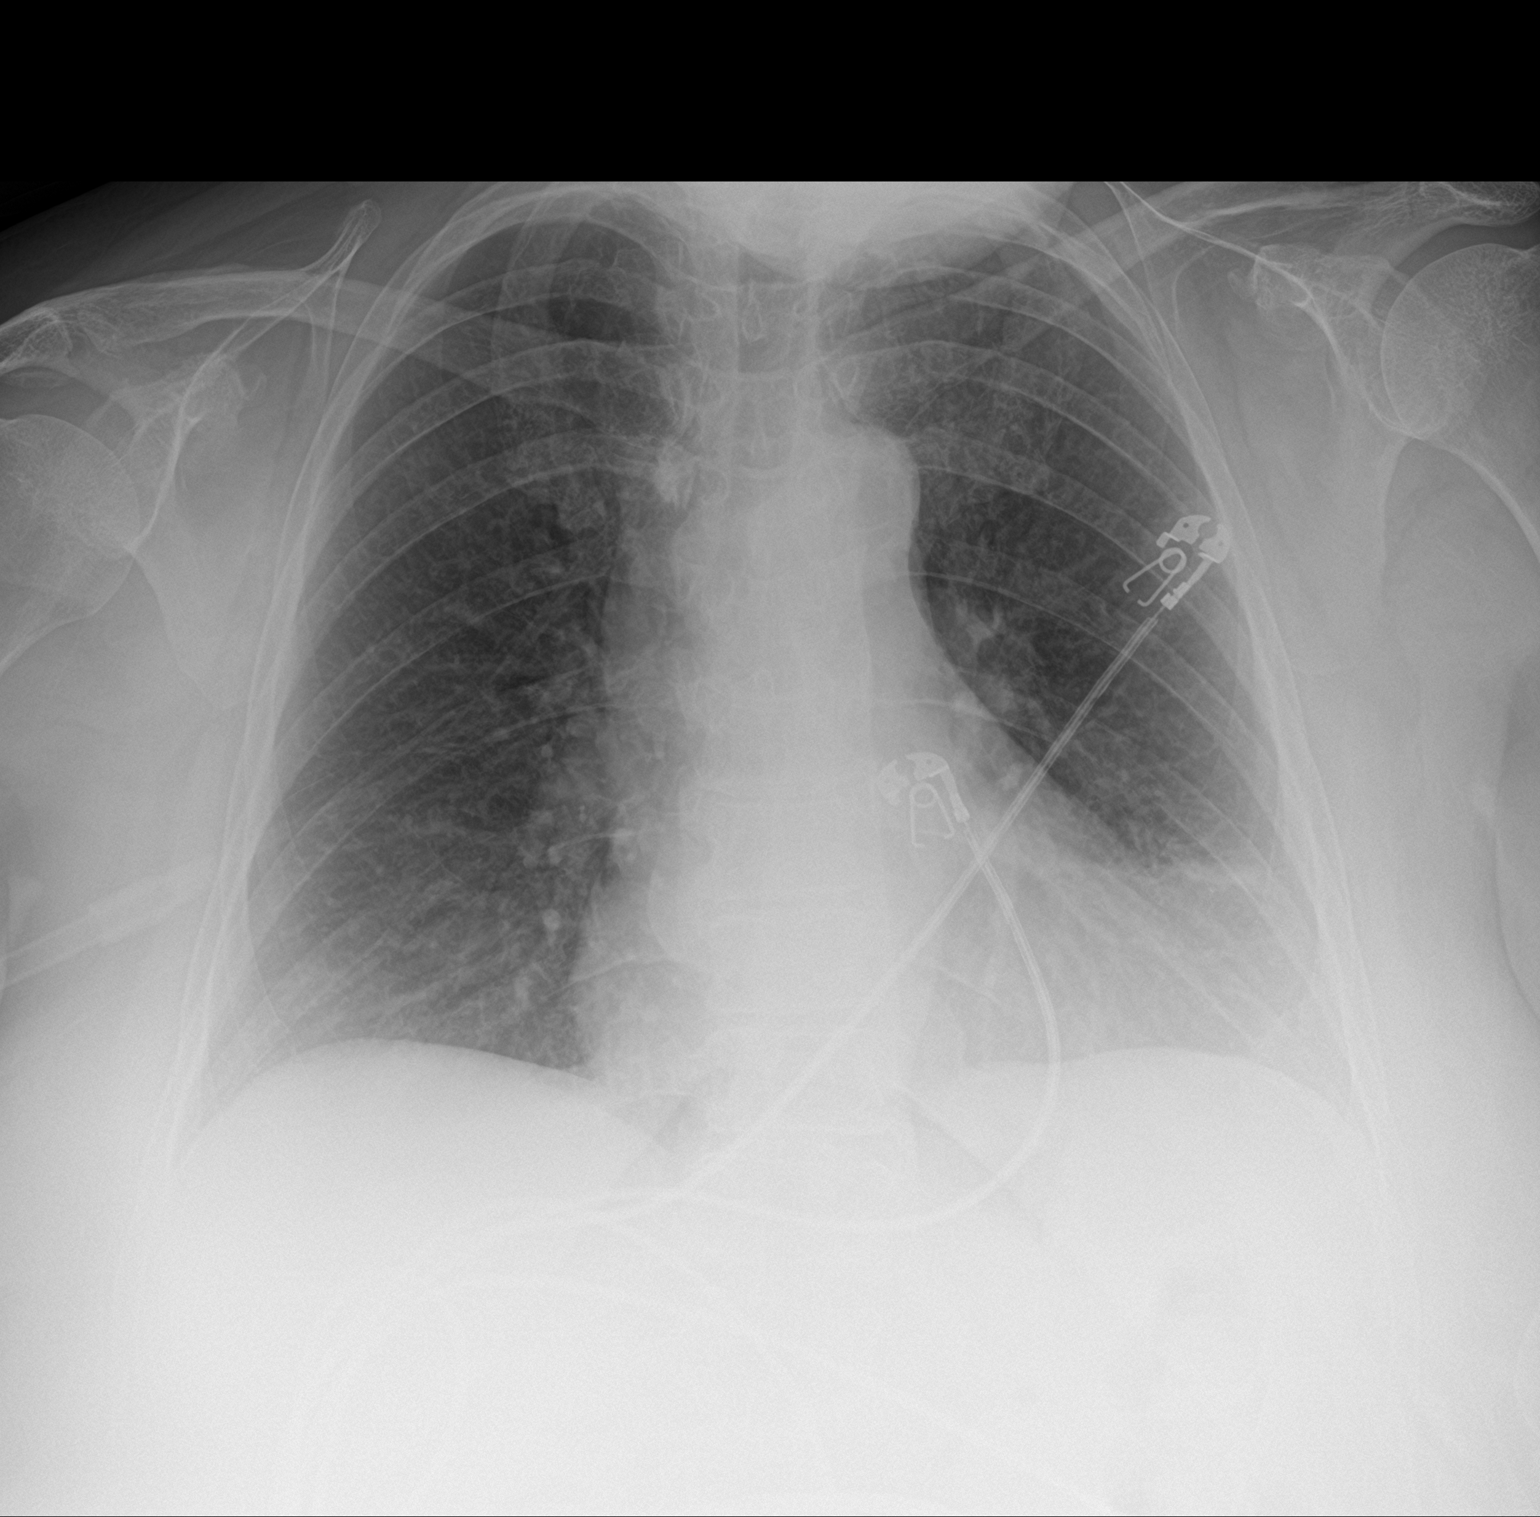

[2 of 2 positions shown; findings below may reference images not displayed]

FINDINGS: Monitoring leads overlie the patient. Stable cardiomegaly. Patchy
consolidation within the left lower hemithorax. No pleural effusion
or pneumothorax. Thoracic spine degenerative changes.
IMPRESSION: Patchy consolidation within the left lower hemithorax concerning for
pneumonia in the appropriate clinical setting. Followup PA and
lateral chest X-ray is recommended in 3-4 weeks following trial of
antibiotic therapy to ensure resolution and exclude underlying
malignancy.

## 2019-06-02 IMAGING — DX DG CHEST 1V PORT
1 series · 1 of 1 positions shown · non-contrast
Comparison: 03/31/2017

CLINICAL DATA: Acute respiratory failure with hypoxia.

EXAM:
PORTABLE CHEST 1 VIEW

[chest]
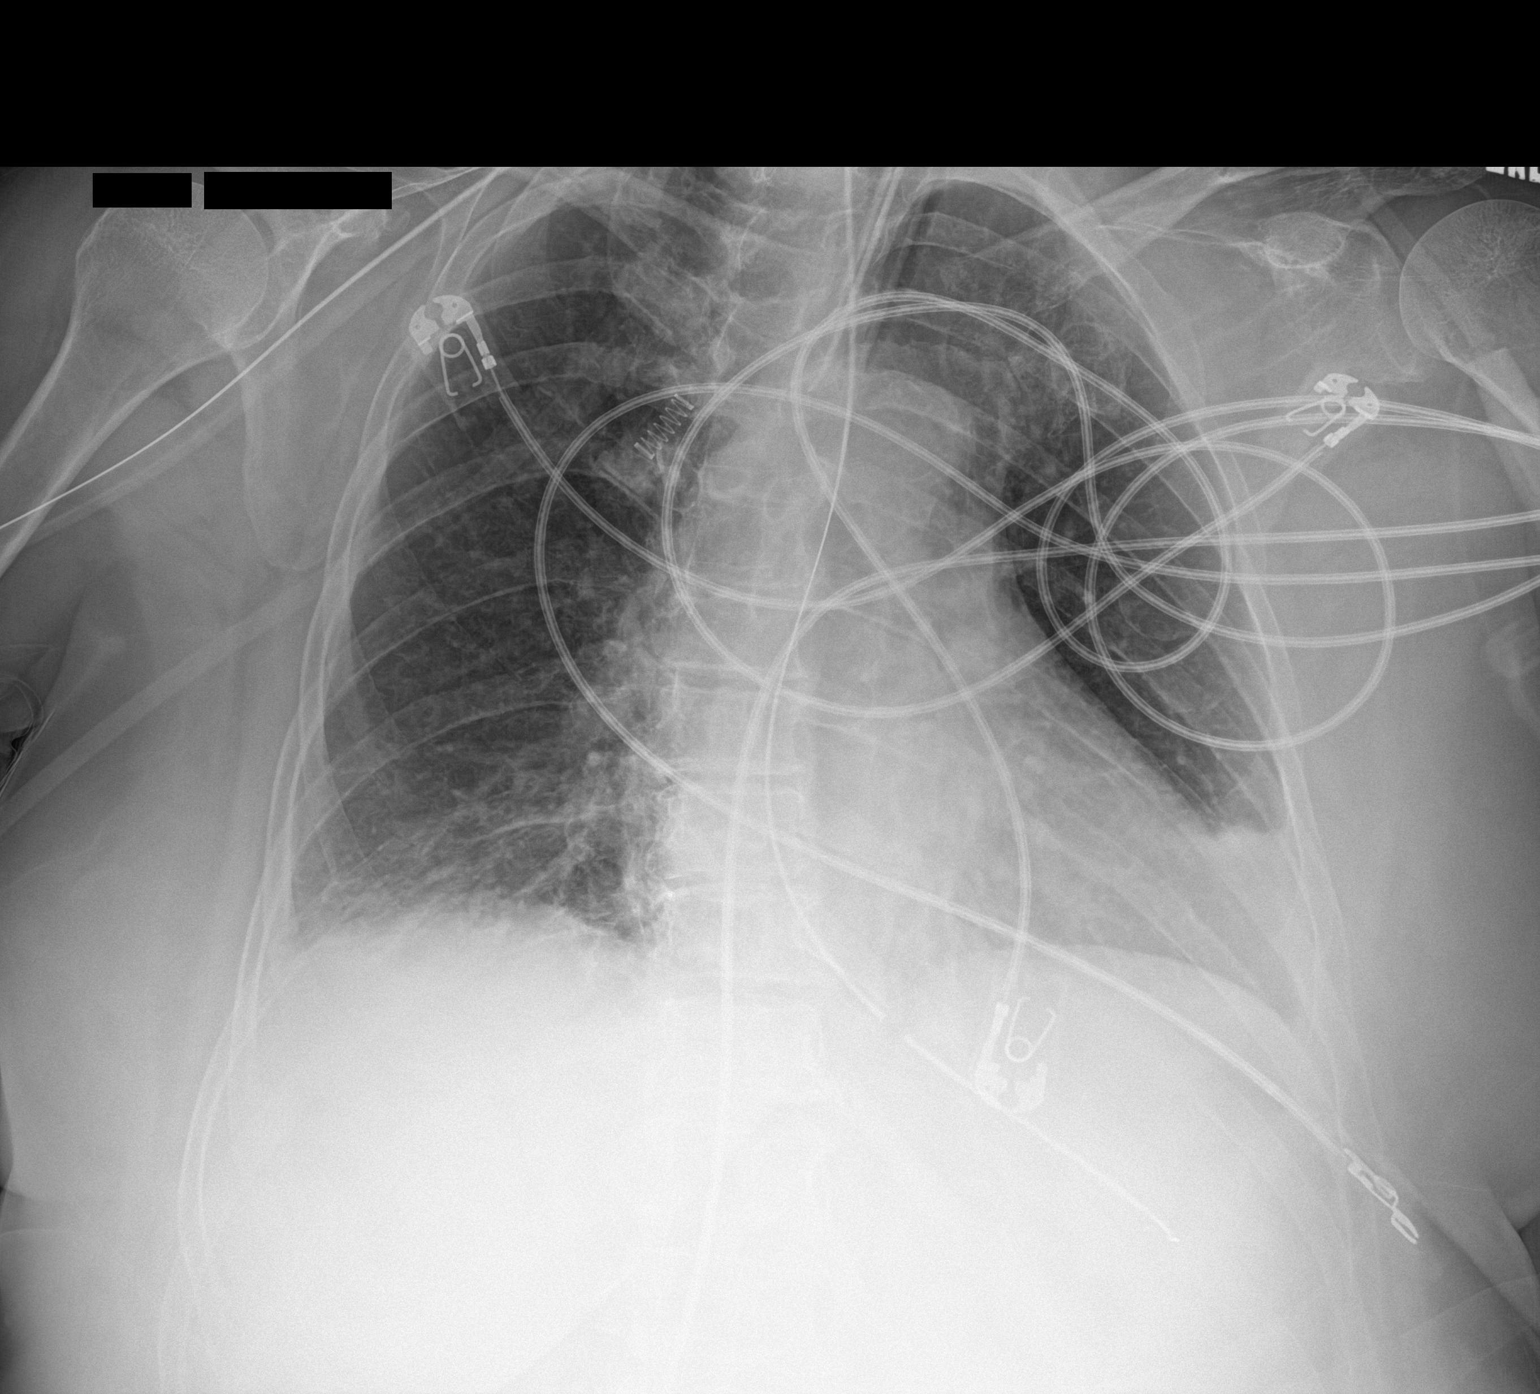

[1 of 1 positions shown; findings below may reference images not displayed]

FINDINGS: Endotracheal tube tip is 3 cm above the carina. Nasogastric tube
enters the stomach. Line artifact overlies the chest. Persistent
mild basilar infiltrate/volume loss, slightly worsened
radiographically. No qualitatively new finding.
IMPRESSION: Slight radiographic worsening of patchy pneumonia in both lower
lobes.

## 2019-06-04 IMAGING — CR DG CHEST 2V
2 series · 2 of 2 positions shown · non-contrast
Comparison: 04/02/2017 .

CLINICAL DATA: Community acquired pneumonia .

EXAM:
CHEST  2 VIEW

[chest lat]
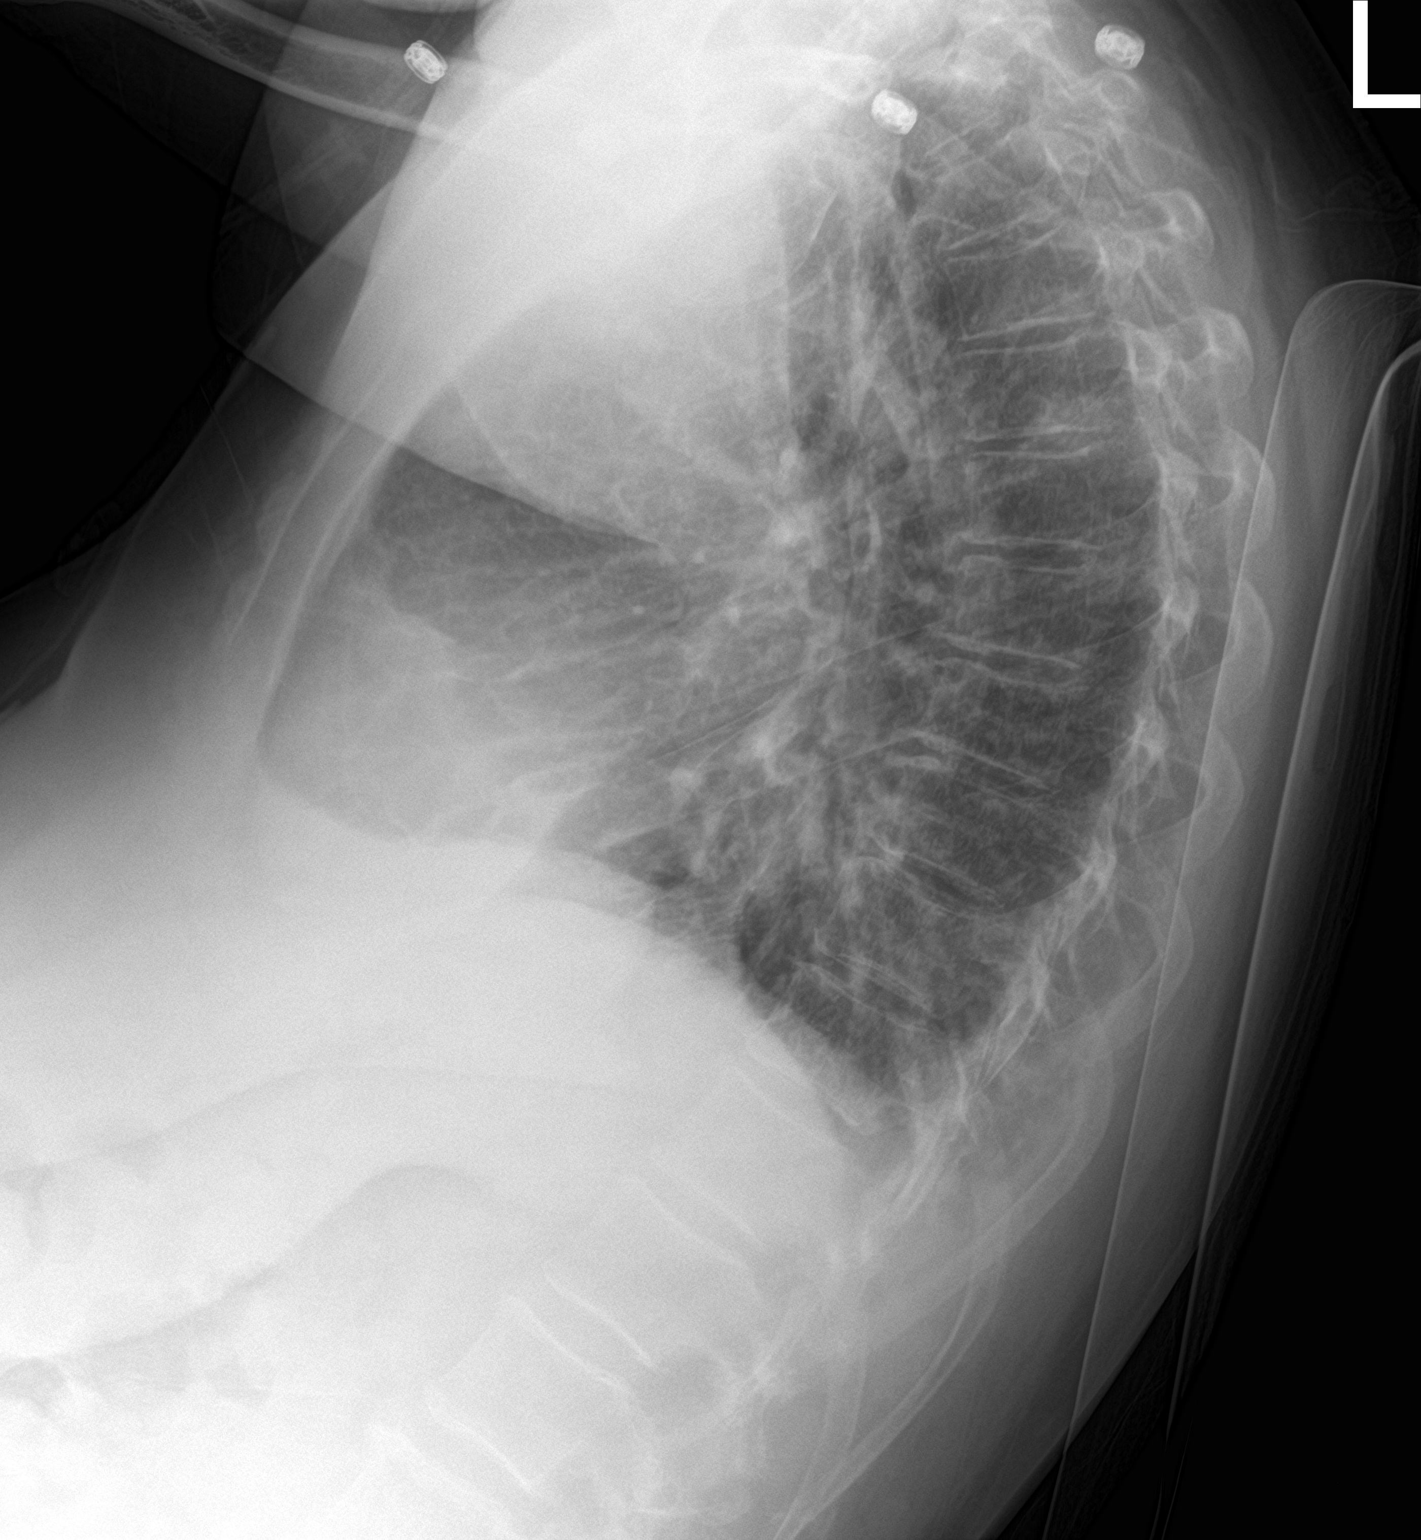

[chest ap]
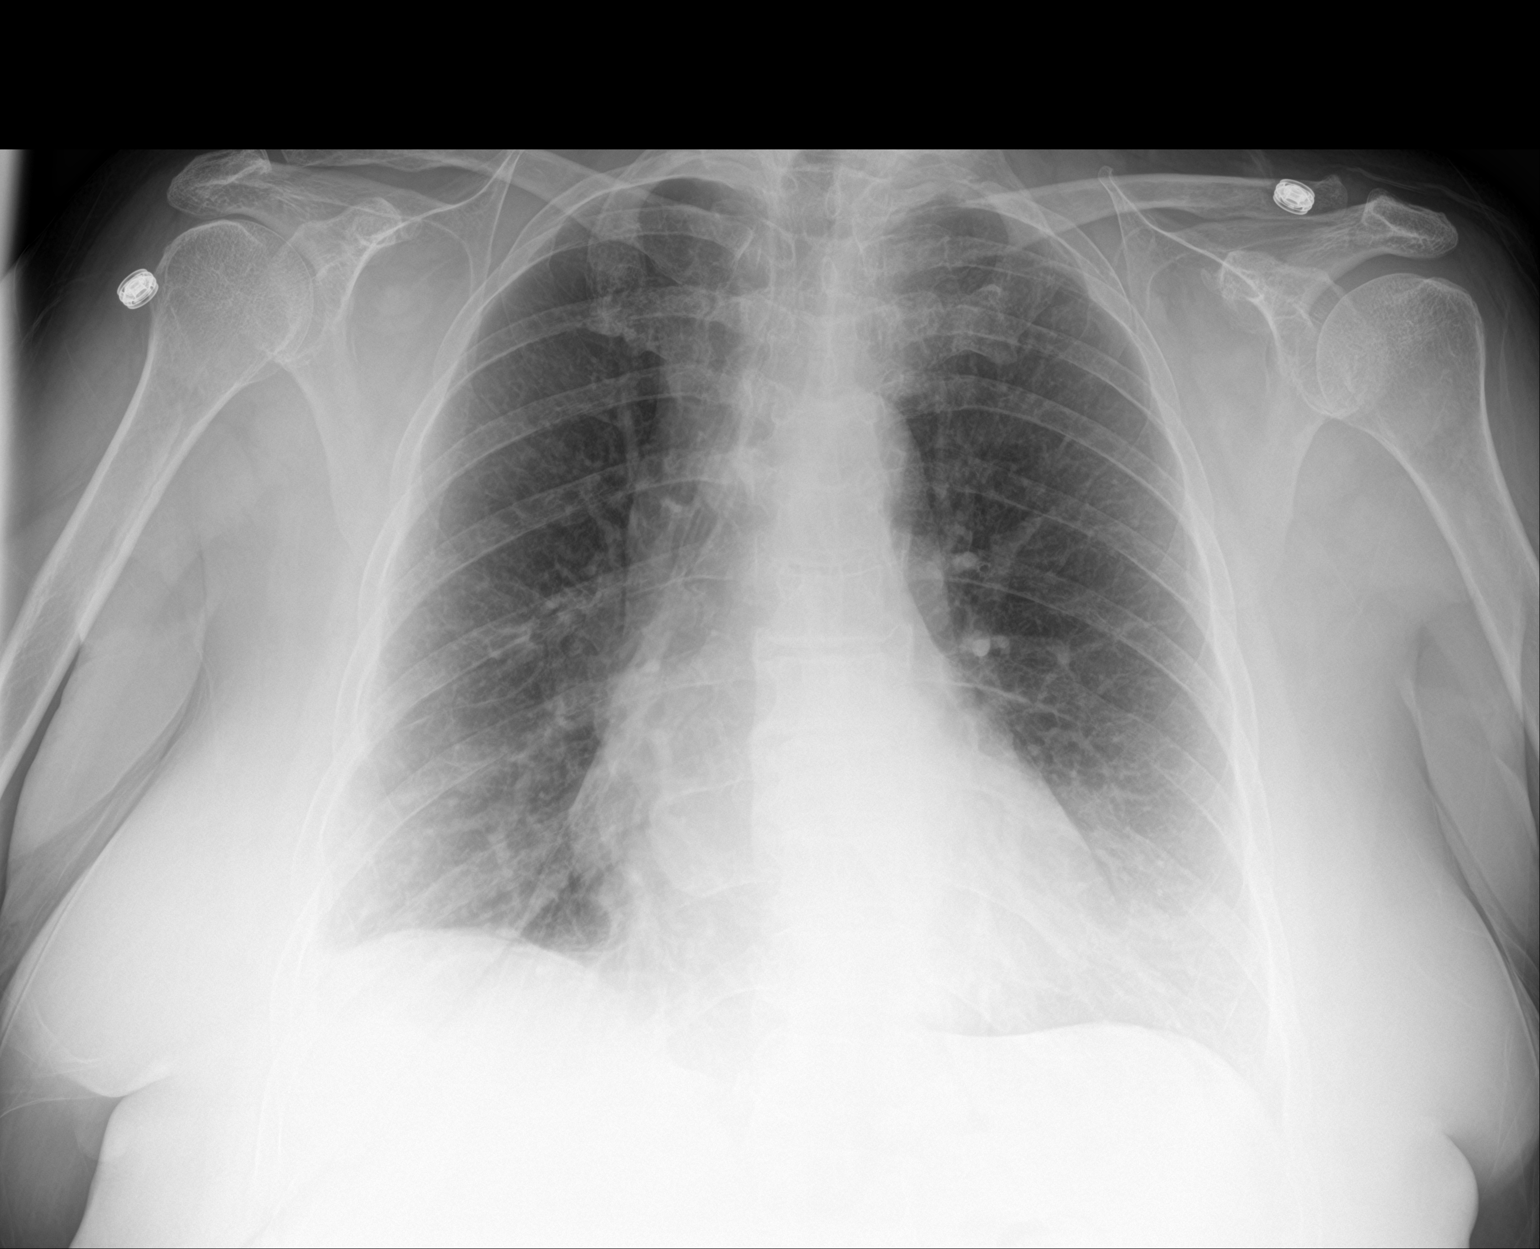

[2 of 2 positions shown; findings below may reference images not displayed]

FINDINGS: Mediastinum and hilar structures normal. Heart size normal. Low lung
volumes. Mild infiltrate right lung base cannot be excluded. Small
right pleural effusion. No pneumothorax. Degenerative changes
thoracic spine .
IMPRESSION: Low lung volumes. Mild infiltrate right lung base cannot be
excluded. Small right pleural effusion

## 2019-08-17 ENCOUNTER — Encounter: Payer: Self-pay | Admitting: Primary Care

## 2019-08-17 ENCOUNTER — Other Ambulatory Visit: Payer: Self-pay

## 2019-08-17 ENCOUNTER — Telehealth: Payer: Self-pay | Admitting: Pulmonary Disease

## 2019-08-17 ENCOUNTER — Telehealth: Payer: Self-pay | Admitting: Adult Health

## 2019-08-17 ENCOUNTER — Ambulatory Visit (INDEPENDENT_AMBULATORY_CARE_PROVIDER_SITE_OTHER): Payer: Self-pay | Admitting: Primary Care

## 2019-08-17 DIAGNOSIS — F32A Depression, unspecified: Secondary | ICD-10-CM

## 2019-08-17 DIAGNOSIS — F329 Major depressive disorder, single episode, unspecified: Secondary | ICD-10-CM

## 2019-08-17 DIAGNOSIS — J441 Chronic obstructive pulmonary disease with (acute) exacerbation: Secondary | ICD-10-CM

## 2019-08-17 MED ORDER — DOXYCYCLINE HYCLATE 100 MG PO TABS: 100.0000 mg | ORAL_TABLET | Freq: Two times a day (BID) | ORAL | 0 refills | Status: DC

## 2019-08-17 MED ORDER — DULOXETINE HCL 60 MG PO CPEP: 60.0000 mg | ORAL_CAPSULE | Freq: Every day | ORAL | 3 refills | Status: AC

## 2019-08-17 MED ORDER — PREDNISONE 10 MG PO TABS: ORAL_TABLET | ORAL | 0 refills | Status: DC

## 2019-08-17 MED ORDER — TRELEGY ELLIPTA 100-62.5-25 MCG/INH IN AEPB: 1.0000 | INHALATION_SPRAY | Freq: Every day | RESPIRATORY_TRACT | 0 refills | Status: DC

## 2019-08-17 MED ORDER — TRELEGY ELLIPTA 100-62.5-25 MCG/INH IN AEPB: 1.0000 | INHALATION_SPRAY | Freq: Every day | RESPIRATORY_TRACT | 5 refills | Status: DC

## 2019-08-17 NOTE — Telephone Encounter (Signed)
LMOMTCB x 1 

## 2019-08-17 NOTE — Telephone Encounter (Signed)
Called and spoke to patient. Patient stated she has been experiencing increased fatigue, increased shortness of breath, sinus congestion, sore throat, and non-productive cough. Patient was offered a video visit but does not have access to do that currently. Patient gave the number: [phone] to use today for a telephone visit. Scheduled patient for telephone visit with NP. Nothing further needed at this time.

## 2019-08-17 NOTE — Patient Instructions (Addendum)
Treating you for COPD exacerbation  Rx: - Doxycycline 1 tab twice daily x 7 days - Prednisone taper as directed  - Trelegy refill sent - Increased Cymbalta 60mg  daily   Recommendations - Take daily antihistamine such as Claritin or zyrtec - Use nasal irrigation twice daily  - Use Flonase in am for nasal congestion  - Mucinex twice daily for chest congestion   Follow-up: - Over due for visit with Dr. Halford Chessman, needs to be scheduled in the next 1-3 months

## 2019-08-17 NOTE — Progress Notes (Signed)
Virtual Visit via Telephone Note  I connected with Courtney Blackburn on 08/17/19 at  2:00 PM EDT by telephone and verified that I am speaking with the correct person using two identifiers.  Location: Patient: Home Provider: Office   I discussed the limitations, risks, security and privacy concerns of performing an evaluation and management service by telephone and the availability of in person appointments. I also discussed with the patient that there may be a patient responsible charge related to this service. The patient expressed understanding and agreed to proceed.   History of Present Illness: 63 year old female, current smoker. PMH significant for COPD, HTN, depression/anxiety, tobacco use disorder. Patient of Dr. Halford Chessman, last seen on 10/03/18.   08/17/2019 Patient contacted for acute televisit. Reports sinus congested for several months. Associated fatigue, shortness of breath and chest congestion. States that her nasal congestion is clear but she feels like "crap". She ran out of Trelegy 6 months ago. Not currently on any maintenance  inhalers. Still using oxygen 2.5L at all times. She needs refill of her Cymbalta. She has been feeling more depressed recently and states that her normal baseline dose is 60mg  daily. No thoughts of suicidal/homicidal ideation. She has not been out of the house in 5 months d/t covid. Denies sick contact, fever/chills or cough.    Observations/Objective:  - No shortness of breath, wheezing or cough noted during phone conversation  ONO with RA 04/04/18 >>test time 5 hrs 1 min. Average SpO2 87%, low SpO2 70%. Spent 3 hrs 19 min with SpO2 <88%.  Spirometry 12/25/17 >> FEV1 1.19 (50%), FEV1% 67  Assessment and Plan:  COPD exacerbation - Resume Trelegy 1 puff daily - RX doxycycline 1 tab twice daily x 7 days; prednisone taper as directed - Advised mucinex twice daily - She is due for influenza vaccine during next visit   Allergic rhinitis - Advised  daily antihistamine and nasal steriod - Recommend nasal irrigation twice daily   Depression/anxiety - Increased Cymbalta to 60mg  daily - Discussed monitoring for worsening symptoms, suicidality or unusual behavior and prsent to ED if she should experience any of these. Advised not to stop abruptly. Needs to establish with PCP for further management.   Chronic respiratory failure with hypoxia - Continues 2.5L oxygen at all times  Follow Up Instructions:  - Due for follow-up with Dr. Halford Chessman    I discussed the assessment and treatment plan with the patient. The patient was provided an opportunity to ask questions and all were answered. The patient agreed with the plan and demonstrated an understanding of the instructions.   The patient was advised to call back or seek an in-person evaluation if the symptoms worsen or if the condition fails to improve as anticipated.  I provided 22 minutes of non-face-to-face time during this encounter.   Martyn Ehrich, NP

## 2019-08-17 NOTE — Telephone Encounter (Signed)
Need rx of Trelegy sent to pharm.  rx sent .

## 2019-08-17 NOTE — Progress Notes (Signed)
Reviewed and agree with assessment/plan.   Goldie Tregoning, MD [place] [facility] 10/24/2016, 12:24 PM Pager:  [phone]  

## 2019-08-19 ENCOUNTER — Telehealth: Payer: Self-pay | Admitting: Pulmonary Disease

## 2019-08-19 DIAGNOSIS — J441 Chronic obstructive pulmonary disease with (acute) exacerbation: Secondary | ICD-10-CM

## 2019-08-19 NOTE — Telephone Encounter (Signed)
ATC pt, line went to voicemail. LMTCB x1. 

## 2019-08-20 NOTE — Telephone Encounter (Signed)
Attempted to call patient at both numbers listed. No answer at one, the other number pt's sister answered who is not listed on DPR so I was only able to ask for the patient to call back. Patient sister stated that patient currently doesn't have a phone and will be hard to reach.

## 2019-08-21 MED ORDER — TRELEGY ELLIPTA 100-62.5-25 MCG/INH IN AEPB: 1.0000 | INHALATION_SPRAY | Freq: Every day | RESPIRATORY_TRACT | 5 refills | Status: DC

## 2019-08-21 NOTE — Telephone Encounter (Signed)
Called [phone], no answer and no VM. Called [phone], this is Patients sister's number. Patient's Sister stated Patient does not have phone, so Sister leaves messages for patient at Patient's neighbor's home. Sister did not recognize number Patient called from. Patient Trelegy prescription was not sent to to Pharmacy. Trelegy prescription was not changed from sample. Trelegy prescription changed from sample to normal, sent to [place].

## 2019-08-24 ENCOUNTER — Telehealth: Payer: Self-pay | Admitting: Pulmonary Disease

## 2019-08-24 NOTE — Telephone Encounter (Signed)
Called number listed and this is NOT the patient's number.  Patient if calls back will need to leave a number she can be reached.

## 2019-08-31 NOTE — Telephone Encounter (Signed)
This is the only number we have listed for the pt. Message will be closed as we have no other form of getting in touch with her.

## 2019-09-15 ENCOUNTER — Ambulatory Visit: Payer: Medicaid Other | Admitting: Pulmonary Disease

## 2019-09-22 ENCOUNTER — Telehealth: Payer: Self-pay | Admitting: Pulmonary Disease

## 2019-09-22 ENCOUNTER — Encounter: Payer: Self-pay | Admitting: Adult Health

## 2019-09-22 ENCOUNTER — Ambulatory Visit (INDEPENDENT_AMBULATORY_CARE_PROVIDER_SITE_OTHER): Payer: Medicaid Other | Admitting: Adult Health

## 2019-09-22 ENCOUNTER — Other Ambulatory Visit: Payer: Self-pay

## 2019-09-22 DIAGNOSIS — R05 Cough: Secondary | ICD-10-CM

## 2019-09-22 DIAGNOSIS — R059 Cough, unspecified: Secondary | ICD-10-CM

## 2019-09-22 MED ORDER — IPRATROPIUM-ALBUTEROL 0.5-2.5 (3) MG/3ML IN SOLN: 3.0000 mL | Freq: Four times a day (QID) | RESPIRATORY_TRACT | 5 refills | Status: AC | PRN

## 2019-09-22 MED ORDER — BUDESONIDE-FORMOTEROL FUMARATE 160-4.5 MCG/ACT IN AERO: 2.0000 | INHALATION_SPRAY | Freq: Two times a day (BID) | RESPIRATORY_TRACT | 5 refills | Status: DC

## 2019-09-22 NOTE — Telephone Encounter (Signed)
Called and spoke with pt who states she has not been feeling well. Pt said for the past three days, she has been very shaky and has slept for three days straight. Pt also believes that she might have a fever but is unsure as she does not have a thermometer to check it.  Pt believes that she is having more problems with her anxiety due to recent issues with living situations with being homeless in the past and possibly going to be homeless again.  Stated to pt due to symptoms of shakiness, poss fever, and sleeping for 3 days that we needed to schedule a televisit and pt verbalized understanding. appt scheduled with TP at 3:30. Nothing further needed.

## 2019-09-22 NOTE — Patient Instructions (Addendum)
Begin Symbicort 160 2 puffs Twice daily   , rinse after use.  May use Duoneb every 6hr as needed for wheezing .  Chest xray at [facility] .  COVID 19 testing at [facility] .  Continue on Oxygen 2.5l/m  Work on not smoking  Please social distance and self quarantine until test results are returned. Follow up with Dr. Halford Chessman  In 4-6 weeks and As needed   Please contact office for sooner follow up if symptoms do not improve or worsen or seek emergency care

## 2019-09-22 NOTE — Progress Notes (Signed)
Virtual Visit via Telephone Note  I connected with Courtney Blackburn on 09/22/19 at  3:30 PM EST by telephone and verified that I am speaking with the correct person using two identifiers.  Location: Patient: Home  Provider: Office    I discussed the limitations, risks, security and privacy concerns of performing an evaluation and management service by telephone and the availability of in person appointments. I also discussed with the patient that there may be a patient responsible charge related to this service. The patient expressed understanding and agreed to proceed.   History of Present Illness: 63 year old female active smoker followed for COPD and chronic respiratory failure on home oxygen at 2.5 L Medical history significant for hypertension depression and anxiety   Today televisit is an acute office visit for COPD.  Complains over the last month that she has had increased episodes of shortness of breath.  She was seen for a telemedicine visit last month with similar symptoms was given doxycycline and a prednisone taper.  She says she did get better with decreased congestion and resolution of discolored mucus however continues to have episodes where her breathing is not as good than her usual baseline.  At baseline she gets short of breath with heavy activity.  Is prone to anxiety attacks.  She says she has lost her sense of taste and smell.  And feels that her cough is picked up over the last week.  She denies any fever, hemoptysis, chest pain, orthopnea, calf pain.  Says that she feels like she caught a cold about a week ago and had some sinus congestion that has slowly improved.  She was started on Trelegy but unfortunately insurance would not cover this.  She also has DuoNeb but is out of this as well.       Observations/Objective: ONO with RA 04/04/18 >>test time 5 hrs 1 min. Average SpO2 87%, low SpO2 70%. Spent 3 hrs 19 min with SpO2 <88%.  Spirometry 12/25/17 >> FEV1 1.19  (50%), FEV1% 67  cXR 03/2018 COPD changes    Assessment and Plan: Slow to resolve COPD flare.-Check chest x-ray. Since she has ongoing respiratory symptoms along with loss of taste and smell.  We will check a COVID-19 test.  Have asked her to social distancing and self quarantine until test results are returned. We will begin ICS/LABA. Smoking cessation  Plan  Patient Instructions  Begin Symbicort 160 2 puffs Twice daily   , rinse after use.  May use Duoneb every 6hr as needed for wheezing .  Chest xray at [facility] .  COVID 19 testing at [facility] .  Continue on Oxygen 2.5l/m  Work on not smoking  Please social distance and self quarantine until test results are returned. Follow up with Dr. Craige Cotta  In 4-6 weeks and As needed   Please contact office for sooner follow up if symptoms do not improve or worsen or seek emergency care       Follow Up Instructions: Follow-up in 4 to 6 weeks and as needed  Please contact office for sooner follow up if symptoms do not improve or worsen or seek emergency care     I discussed the assessment and treatment plan with the patient. The patient was provided an opportunity to ask questions and all were answered. The patient agreed with the plan and demonstrated an understanding of the instructions.   The patient was advised to call back or seek an in-person evaluation if the symptoms  worsen or if the condition fails to improve as anticipated.  I provided 25  minutes of non-face-to-face time during this encounter.   Rexene Edison, NP

## 2019-09-23 NOTE — Progress Notes (Signed)
Reviewed and agree with assessment/plan.   Viraaj Vorndran, MD [place] [facility] 10/24/2016, 12:24 PM Pager:  [phone]  

## 2019-10-01 ENCOUNTER — Ambulatory Visit: Payer: Medicaid Other | Admitting: Pulmonary Disease

## 2019-10-08 ENCOUNTER — Telehealth: Payer: Self-pay | Admitting: Pulmonary Disease

## 2019-10-08 NOTE — Telephone Encounter (Signed)
LMTCB

## 2019-10-09 NOTE — Telephone Encounter (Signed)
Left message for patient to call back  

## 2019-10-12 NOTE — Telephone Encounter (Signed)
ATC pt, no answer. Left message for pt to call back.  Will calose encouter due to  3 unsuccessful attempts to reach pt per triage protocol

## 2019-12-14 ENCOUNTER — Telehealth: Payer: Self-pay | Admitting: Pulmonary Disease

## 2019-12-14 DIAGNOSIS — J441 Chronic obstructive pulmonary disease with (acute) exacerbation: Secondary | ICD-10-CM

## 2019-12-14 NOTE — Telephone Encounter (Signed)
lmtcb for pt.  

## 2019-12-15 NOTE — Telephone Encounter (Signed)
LMTCB x2  

## 2019-12-16 MED ORDER — TRELEGY ELLIPTA 100-62.5-25 MCG/INH IN AEPB: 1.0000 | INHALATION_SPRAY | Freq: Every day | RESPIRATORY_TRACT | 5 refills | Status: DC

## 2019-12-16 NOTE — Telephone Encounter (Signed)
Yes ok to refill once, and she needs to keep apt with Dr. Craige Cotta on 2/26. Please take Symbicort of medication list if not already done. Thanks

## 2019-12-16 NOTE — Telephone Encounter (Signed)
Called spoke with patient who is requesting a refill on her Trelegy.  Patient also has Symbicort on her med list (per 11.24.20 phone note with TP, insurance would not cover the Trelegy so she was switched back to Symbicort) and patient is unsure if she is taking both Symbicort and Trelegy.  Advised patient she is NOT to take both inhalers as the ingredient that is Symbicort is included in the Trelegy.  Patient voiced her understanding.  Also, patient is requesting a refill on Cymbalta 60mg  once daily.  Asked patient if we typically fill this for her (last done 10.19.20 #30 with 3 refills by [facility] NP) or does she normally get this from her PCP.  Patient stated that she "hasn't gotten around" to establishing with a PCP yet (she has been homeless for approx 6 months) and that [facility] normally takes care of all her refills for her.  Dr Craige Cotta not in office Cymbalta last refilled by Craige Cotta NP who happens to be APP of the day.  Please advise, thank you.  Trelegy has been sent.

## 2019-12-21 NOTE — Telephone Encounter (Signed)
ATC pt, call went straight to voicemail but I could not leave a message due to the mailbox being full. 

## 2019-12-22 NOTE — Telephone Encounter (Signed)
ATC pt, call went straight to voicemail but I could not leave a message due to the mailbox being full.

## 2019-12-23 NOTE — Telephone Encounter (Signed)
ATC pt, call went straight to voicemail but I could not leave a message due to the mailbox being full. We have attempted to contact pt several times with no success or call back from pt. Per triage protocol, message will be closed.

## 2019-12-25 ENCOUNTER — Ambulatory Visit: Payer: Medicaid Other | Admitting: Pulmonary Disease

## 2020-01-06 ENCOUNTER — Other Ambulatory Visit: Payer: Self-pay | Admitting: Primary Care

## 2020-01-08 ENCOUNTER — Telehealth: Payer: Self-pay | Admitting: Pulmonary Disease

## 2020-01-08 DIAGNOSIS — J441 Chronic obstructive pulmonary disease with (acute) exacerbation: Secondary | ICD-10-CM

## 2020-01-08 NOTE — Telephone Encounter (Signed)
I called pt but their was no answer. I was unable to leave voicemail to advise pt to call back. Will try again later.

## 2020-01-12 NOTE — Telephone Encounter (Signed)
ATC pt, there was no answer and no option to leave a message. Will try back. 

## 2020-01-13 MED ORDER — TRELEGY ELLIPTA 100-62.5-25 MCG/INH IN AEPB: 1.0000 | INHALATION_SPRAY | Freq: Every day | RESPIRATORY_TRACT | 3 refills | Status: AC

## 2020-01-13 NOTE — Telephone Encounter (Signed)
Per 2.15.21 phone note:  Glenford Bayley, NP 3:39 PM Note   Yes ok to refill once, and she needs to keep apt with Dr. Craige Cotta on 2/26. Please take Symbicort of medication list if not already done. Thanks     Me 3:21 PM Note   Called spoke with patient who is requesting a refill on her Trelegy.  Patient also has Symbicort on her med list (per 11.24.20 phone note with TP, insurance would not cover the Trelegy so she was switched back to Symbicort) and patient is unsure if she is taking both Symbicort and Trelegy.  Advised patient she is NOT to take both inhalers as the ingredient that is Symbicort is included in the Trelegy.  Patient voiced her understanding.  Also, patient is requesting a refill on Cymbalta 60mg  once daily.  Asked patient if we typically fill this for her (last done 10.19.20 #30 with 3 refills by [facility] NP) or does she normally get this from her PCP.  Patient stated that she "hasn't gotten around" to establishing with a PCP yet (she has been homeless for approx 6 months) and that [facility] normally takes care of all her refills for her.  Dr Craige Cotta not in office Cymbalta last refilled by Craige Cotta NP who happens to be APP of the day.  Please advise, thank you.  Trelegy has been sent.      Called spoke with patient, explained that we sent the Trelegy to the pharmacy she requested last time she called ([facility] [address]) and tried to call her about the Cymbalta but she never answered the phone.  Patient stated she now resides in [place] and would like the Trelegy sent to [facility] in [place].  Done.   Patient asked about her Cymbalta.  Explained to patient that we had authorization to send the Cymbalta x1 but she had needed to keep her 2/26 appt with Dr 3/26 - she noshowed for that appt.  Patient is now scheduled to see Tammy NP on 3.24.21.  Advised patient will need to obtain new authorization to send this medication and she will receive call back tomorrow.  Patient okay with  this.    Confirmed with patient her 3/24 appt and advised the importance of keeping this appt.  Pt voiced her understanding.  Tammy please advise if okay for Cymbalta Rx (Dr. 4/24 is not available).  Thank you.

## 2020-01-14 NOTE — Telephone Encounter (Signed)
This was refilled by Clent Ridges NP .  Can refill x 1 . I will discuss with her on return to have this followed by PCP . If she has been taking regularly do not want to stop . Will evaluate at office visit on 3/24.  Please send only 1 refill .

## 2020-01-14 NOTE — Telephone Encounter (Signed)
ATC patient but she did not answer. VM is full so I could not leave a message. Will attempt again.

## 2020-01-19 NOTE — Telephone Encounter (Signed)
ATC pt. VM box is full. Pt has appt tomorrow with TP on 01/20/20.

## 2020-01-20 ENCOUNTER — Ambulatory Visit: Payer: Medicaid Other | Admitting: Adult Health

## 2020-01-25 ENCOUNTER — Ambulatory Visit: Payer: Medicaid Other | Admitting: Primary Care

## 2020-01-25 ENCOUNTER — Ambulatory Visit: Payer: Medicaid Other | Admitting: Adult Health

## 2020-04-28 DEATH — deceased
